# Patient Record
Sex: Female | Born: 1940 | Race: White | Hispanic: No | Smoking: Never smoker
Health system: Southern US, Community
[De-identification: ages and names within clinical notes are randomized; demographics above are authoritative.]

## PROBLEM LIST (undated history)

## (undated) DIAGNOSIS — I2510 Atherosclerotic heart disease of native coronary artery without angina pectoris: Secondary | ICD-10-CM

## (undated) DIAGNOSIS — K219 Gastro-esophageal reflux disease without esophagitis: Secondary | ICD-10-CM

## (undated) DIAGNOSIS — I471 Supraventricular tachycardia, unspecified: Secondary | ICD-10-CM

## (undated) DIAGNOSIS — I5189 Other ill-defined heart diseases: Secondary | ICD-10-CM

## (undated) DIAGNOSIS — H9190 Unspecified hearing loss, unspecified ear: Secondary | ICD-10-CM

## (undated) DIAGNOSIS — K602 Anal fissure, unspecified: Secondary | ICD-10-CM

## (undated) DIAGNOSIS — H35363 Drusen (degenerative) of macula, bilateral: Secondary | ICD-10-CM

## (undated) DIAGNOSIS — H25819 Combined forms of age-related cataract, unspecified eye: Secondary | ICD-10-CM

## (undated) DIAGNOSIS — E785 Hyperlipidemia, unspecified: Secondary | ICD-10-CM

## (undated) DIAGNOSIS — D509 Iron deficiency anemia, unspecified: Secondary | ICD-10-CM

## (undated) DIAGNOSIS — R739 Hyperglycemia, unspecified: Secondary | ICD-10-CM

## (undated) DIAGNOSIS — G250 Essential tremor: Secondary | ICD-10-CM

## (undated) DIAGNOSIS — J189 Pneumonia, unspecified organism: Secondary | ICD-10-CM

## (undated) DIAGNOSIS — M3500 Sicca syndrome, unspecified: Secondary | ICD-10-CM

## (undated) DIAGNOSIS — M8580 Other specified disorders of bone density and structure, unspecified site: Secondary | ICD-10-CM

## (undated) DIAGNOSIS — H524 Presbyopia: Secondary | ICD-10-CM

## (undated) DIAGNOSIS — I700 Atherosclerosis of aorta: Secondary | ICD-10-CM

## (undated) DIAGNOSIS — M810 Age-related osteoporosis without current pathological fracture: Secondary | ICD-10-CM

## (undated) DIAGNOSIS — H52209 Unspecified astigmatism, unspecified eye: Secondary | ICD-10-CM

## (undated) DIAGNOSIS — K649 Unspecified hemorrhoids: Secondary | ICD-10-CM

## (undated) DIAGNOSIS — R0602 Shortness of breath: Secondary | ICD-10-CM

## (undated) DIAGNOSIS — Z87448 Personal history of other diseases of urinary system: Secondary | ICD-10-CM

## (undated) DIAGNOSIS — M5030 Other cervical disc degeneration, unspecified cervical region: Secondary | ICD-10-CM

## (undated) DIAGNOSIS — D3140 Benign neoplasm of unspecified ciliary body: Secondary | ICD-10-CM

## (undated) DIAGNOSIS — K5900 Constipation, unspecified: Secondary | ICD-10-CM

## (undated) DIAGNOSIS — M1990 Unspecified osteoarthritis, unspecified site: Secondary | ICD-10-CM

## (undated) DIAGNOSIS — F419 Anxiety disorder, unspecified: Secondary | ICD-10-CM

## (undated) DIAGNOSIS — R079 Chest pain, unspecified: Secondary | ICD-10-CM

## (undated) DIAGNOSIS — I10 Essential (primary) hypertension: Secondary | ICD-10-CM

## (undated) DIAGNOSIS — L309 Dermatitis, unspecified: Secondary | ICD-10-CM

## (undated) DIAGNOSIS — H5200 Hypermetropia, unspecified eye: Secondary | ICD-10-CM

## (undated) DIAGNOSIS — K635 Polyp of colon: Secondary | ICD-10-CM

## (undated) DIAGNOSIS — T7840XA Allergy, unspecified, initial encounter: Secondary | ICD-10-CM

## (undated) DIAGNOSIS — H43813 Vitreous degeneration, bilateral: Secondary | ICD-10-CM

## (undated) DIAGNOSIS — Z8619 Personal history of other infectious and parasitic diseases: Secondary | ICD-10-CM

## (undated) DIAGNOSIS — I341 Nonrheumatic mitral (valve) prolapse: Secondary | ICD-10-CM

## (undated) DIAGNOSIS — E559 Vitamin D deficiency, unspecified: Secondary | ICD-10-CM

## (undated) HISTORY — DX: Age-related osteoporosis without current pathological fracture: M81.0

## (undated) HISTORY — DX: Unspecified osteoarthritis, unspecified site: M19.90

## (undated) HISTORY — DX: Other specified disorders of bone density and structure, unspecified site: M85.80

## (undated) HISTORY — PX: COLON SURGERY: SHX602

## (undated) HISTORY — DX: Combined forms of age-related cataract, unspecified eye: H25.819

## (undated) HISTORY — DX: Unspecified hemorrhoids: K64.9

## (undated) HISTORY — DX: Unspecified hearing loss, unspecified ear: H91.90

## (undated) HISTORY — DX: Hypermetropia, unspecified eye: H52.00

## (undated) HISTORY — DX: Dermatitis, unspecified: L30.9

## (undated) HISTORY — PX: CYST EXCISION: SHX5701

## (undated) HISTORY — DX: Drusen (degenerative) of macula, bilateral: H35.363

## (undated) HISTORY — DX: Sjogren syndrome, unspecified: M35.00

## (undated) HISTORY — PX: ORIF FINGER / THUMB FRACTURE: SUR932

## (undated) HISTORY — DX: Gastro-esophageal reflux disease without esophagitis: K21.9

## (undated) HISTORY — PX: TONSILLECTOMY: SUR1361

## (undated) HISTORY — DX: Constipation, unspecified: K59.00

## (undated) HISTORY — DX: Polyp of colon: K63.5

## (undated) HISTORY — DX: Vitreous degeneration, bilateral: H43.813

## (undated) HISTORY — PX: INCISION AND DRAINAGE PERIRECTAL ABSCESS: SHX1804

## (undated) HISTORY — PX: HEMORRHOID SURGERY: SHX153

## (undated) HISTORY — DX: Hyperlipidemia, unspecified: E78.5

## (undated) HISTORY — PX: ABDOMINAL HYSTERECTOMY: SHX81

## (undated) HISTORY — DX: Personal history of other diseases of urinary system: Z87.448

## (undated) HISTORY — DX: Vitamin D deficiency, unspecified: E55.9

## (undated) HISTORY — DX: Unspecified astigmatism, unspecified eye: H52.209

## (undated) HISTORY — DX: Essential tremor: G25.0

## (undated) HISTORY — DX: Allergy, unspecified, initial encounter: T78.40XA

## (undated) HISTORY — DX: Nonrheumatic mitral (valve) prolapse: I34.1

## (undated) HISTORY — DX: Benign neoplasm of unspecified ciliary body: D31.40

## (undated) HISTORY — DX: Essential (primary) hypertension: I10

## (undated) HISTORY — DX: Presbyopia: H52.4

## (undated) HISTORY — DX: Hyperglycemia, unspecified: R73.9

## (undated) HISTORY — DX: Anxiety disorder, unspecified: F41.9

## (undated) HISTORY — DX: Personal history of other infectious and parasitic diseases: Z86.19

---

## 2005-06-18 HISTORY — PX: COLONOSCOPY W/ BIOPSIES: SHX1374

## 2010-11-25 DIAGNOSIS — F419 Anxiety disorder, unspecified: Secondary | ICD-10-CM | POA: Insufficient documentation

## 2010-11-25 DIAGNOSIS — I341 Nonrheumatic mitral (valve) prolapse: Secondary | ICD-10-CM | POA: Insufficient documentation

## 2010-11-25 DIAGNOSIS — K219 Gastro-esophageal reflux disease without esophagitis: Secondary | ICD-10-CM | POA: Insufficient documentation

## 2010-11-25 DIAGNOSIS — Z8619 Personal history of other infectious and parasitic diseases: Secondary | ICD-10-CM | POA: Insufficient documentation

## 2010-11-25 DIAGNOSIS — M3500 Sicca syndrome, unspecified: Secondary | ICD-10-CM | POA: Insufficient documentation

## 2010-11-25 DIAGNOSIS — K635 Polyp of colon: Secondary | ICD-10-CM | POA: Insufficient documentation

## 2010-11-25 DIAGNOSIS — G250 Essential tremor: Secondary | ICD-10-CM | POA: Insufficient documentation

## 2010-11-25 DIAGNOSIS — M8580 Other specified disorders of bone density and structure, unspecified site: Secondary | ICD-10-CM | POA: Insufficient documentation

## 2011-02-19 DIAGNOSIS — E559 Vitamin D deficiency, unspecified: Secondary | ICD-10-CM | POA: Insufficient documentation

## 2012-07-26 DIAGNOSIS — H25819 Combined forms of age-related cataract, unspecified eye: Secondary | ICD-10-CM | POA: Insufficient documentation

## 2012-07-26 DIAGNOSIS — H5200 Hypermetropia, unspecified eye: Secondary | ICD-10-CM | POA: Insufficient documentation

## 2012-07-26 DIAGNOSIS — D3140 Benign neoplasm of unspecified ciliary body: Secondary | ICD-10-CM | POA: Insufficient documentation

## 2013-02-16 DIAGNOSIS — I10 Essential (primary) hypertension: Secondary | ICD-10-CM | POA: Insufficient documentation

## 2013-12-07 DIAGNOSIS — K5909 Other constipation: Secondary | ICD-10-CM | POA: Insufficient documentation

## 2013-12-07 DIAGNOSIS — L309 Dermatitis, unspecified: Secondary | ICD-10-CM | POA: Insufficient documentation

## 2016-07-22 DIAGNOSIS — L820 Inflamed seborrheic keratosis: Secondary | ICD-10-CM | POA: Diagnosis not present

## 2016-08-04 DIAGNOSIS — R0789 Other chest pain: Secondary | ICD-10-CM | POA: Diagnosis not present

## 2016-08-04 DIAGNOSIS — I10 Essential (primary) hypertension: Secondary | ICD-10-CM | POA: Diagnosis not present

## 2016-08-04 DIAGNOSIS — E785 Hyperlipidemia, unspecified: Secondary | ICD-10-CM | POA: Diagnosis not present

## 2016-08-04 DIAGNOSIS — M67442 Ganglion, left hand: Secondary | ICD-10-CM | POA: Diagnosis not present

## 2016-09-22 DIAGNOSIS — I10 Essential (primary) hypertension: Secondary | ICD-10-CM | POA: Diagnosis not present

## 2016-09-22 DIAGNOSIS — E559 Vitamin D deficiency, unspecified: Secondary | ICD-10-CM | POA: Diagnosis not present

## 2016-09-22 DIAGNOSIS — R739 Hyperglycemia, unspecified: Secondary | ICD-10-CM | POA: Diagnosis not present

## 2016-09-22 DIAGNOSIS — E785 Hyperlipidemia, unspecified: Secondary | ICD-10-CM | POA: Diagnosis not present

## 2016-09-22 LAB — BASIC METABOLIC PANEL
BUN: 15 mg/dL (ref 4–21)
Creatinine: 0.8 mg/dL (ref 0.5–1.1)
GLUCOSE: 94 mg/dL
Potassium: 4.9 mmol/L (ref 3.4–5.3)
Sodium: 140 mmol/L (ref 137–147)

## 2016-09-22 LAB — VITAMIN D 25 HYDROXY (VIT D DEFICIENCY, FRACTURES): VIT D 25 HYDROXY: 49.5

## 2016-09-22 LAB — LIPID PANEL
CHOLESTEROL: 214 mg/dL — AB (ref 0–200)
HDL: 71 mg/dL — AB (ref 35–70)
LDL CALC: 125 mg/dL
Triglycerides: 90 mg/dL (ref 40–160)

## 2016-09-22 LAB — HEPATIC FUNCTION PANEL
ALK PHOS: 58 U/L (ref 25–125)
ALT: 19 U/L (ref 7–35)
AST: 22 U/L (ref 13–35)
Bilirubin, Total: 0.2 mg/dL

## 2016-09-22 LAB — CBC AND DIFFERENTIAL
HEMATOCRIT: 35 % — AB (ref 36–46)
HEMOGLOBIN: 11.3 g/dL — AB (ref 12.0–16.0)
Neutrophils Absolute: 1 /uL
Platelets: 190 10*3/uL (ref 150–399)
WBC: 3.2 10*3/mL

## 2016-09-23 DIAGNOSIS — R2232 Localized swelling, mass and lump, left upper limb: Secondary | ICD-10-CM | POA: Diagnosis not present

## 2016-09-29 DIAGNOSIS — H35363 Drusen (degenerative) of macula, bilateral: Secondary | ICD-10-CM | POA: Insufficient documentation

## 2016-09-29 DIAGNOSIS — H43813 Vitreous degeneration, bilateral: Secondary | ICD-10-CM | POA: Insufficient documentation

## 2016-10-02 DIAGNOSIS — K5909 Other constipation: Secondary | ICD-10-CM | POA: Diagnosis not present

## 2016-10-02 DIAGNOSIS — Z0000 Encounter for general adult medical examination without abnormal findings: Secondary | ICD-10-CM | POA: Diagnosis not present

## 2016-10-02 DIAGNOSIS — Z23 Encounter for immunization: Secondary | ICD-10-CM | POA: Diagnosis not present

## 2016-10-02 DIAGNOSIS — R739 Hyperglycemia, unspecified: Secondary | ICD-10-CM | POA: Diagnosis not present

## 2016-10-02 DIAGNOSIS — E785 Hyperlipidemia, unspecified: Secondary | ICD-10-CM | POA: Diagnosis not present

## 2016-10-02 DIAGNOSIS — E559 Vitamin D deficiency, unspecified: Secondary | ICD-10-CM | POA: Diagnosis not present

## 2016-10-02 DIAGNOSIS — I10 Essential (primary) hypertension: Secondary | ICD-10-CM | POA: Diagnosis not present

## 2016-10-02 DIAGNOSIS — M8580 Other specified disorders of bone density and structure, unspecified site: Secondary | ICD-10-CM | POA: Diagnosis not present

## 2016-10-07 DIAGNOSIS — M67442 Ganglion, left hand: Secondary | ICD-10-CM | POA: Diagnosis not present

## 2016-10-07 DIAGNOSIS — R2232 Localized swelling, mass and lump, left upper limb: Secondary | ICD-10-CM | POA: Diagnosis not present

## 2016-10-19 DIAGNOSIS — I10 Essential (primary) hypertension: Secondary | ICD-10-CM | POA: Diagnosis not present

## 2016-10-19 DIAGNOSIS — J40 Bronchitis, not specified as acute or chronic: Secondary | ICD-10-CM | POA: Diagnosis not present

## 2016-10-19 DIAGNOSIS — J0100 Acute maxillary sinusitis, unspecified: Secondary | ICD-10-CM | POA: Diagnosis not present

## 2017-01-14 DIAGNOSIS — H43813 Vitreous degeneration, bilateral: Secondary | ICD-10-CM | POA: Diagnosis not present

## 2017-03-17 ENCOUNTER — Encounter: Payer: Self-pay | Admitting: Family Medicine

## 2017-03-17 ENCOUNTER — Ambulatory Visit (INDEPENDENT_AMBULATORY_CARE_PROVIDER_SITE_OTHER): Payer: Medicare Other | Admitting: Family Medicine

## 2017-03-17 VITALS — BP 120/86 | HR 74 | Temp 97.5°F | Ht 64.5 in | Wt 126.0 lb

## 2017-03-17 DIAGNOSIS — Z1239 Encounter for other screening for malignant neoplasm of breast: Secondary | ICD-10-CM

## 2017-03-17 DIAGNOSIS — E2839 Other primary ovarian failure: Secondary | ICD-10-CM

## 2017-03-17 DIAGNOSIS — G2581 Restless legs syndrome: Secondary | ICD-10-CM | POA: Insufficient documentation

## 2017-03-17 DIAGNOSIS — F419 Anxiety disorder, unspecified: Secondary | ICD-10-CM

## 2017-03-17 DIAGNOSIS — Z1231 Encounter for screening mammogram for malignant neoplasm of breast: Secondary | ICD-10-CM

## 2017-03-17 DIAGNOSIS — G4700 Insomnia, unspecified: Secondary | ICD-10-CM

## 2017-03-17 DIAGNOSIS — M8580 Other specified disorders of bone density and structure, unspecified site: Secondary | ICD-10-CM

## 2017-03-17 DIAGNOSIS — E785 Hyperlipidemia, unspecified: Secondary | ICD-10-CM

## 2017-03-17 DIAGNOSIS — I10 Essential (primary) hypertension: Secondary | ICD-10-CM

## 2017-03-17 MED ORDER — ZOSTER VAC RECOMB ADJUVANTED 50 MCG/0.5ML IM SUSR: 0.5000 mL | Freq: Once | INTRAMUSCULAR | 0 refills | Status: AC

## 2017-03-17 MED ORDER — ALPRAZOLAM 0.5 MG PO TABS: 0.5000 mg | ORAL_TABLET | Freq: Every evening | ORAL | 0 refills | Status: DC | PRN

## 2017-03-17 MED ORDER — ATORVASTATIN CALCIUM 10 MG PO TABS: 10.0000 mg | ORAL_TABLET | Freq: Every day | ORAL | 3 refills | Status: DC

## 2017-03-17 MED ORDER — ESCITALOPRAM OXALATE 10 MG PO TABS: 5.0000 mg | ORAL_TABLET | Freq: Every day | ORAL | 1 refills | Status: DC

## 2017-03-17 NOTE — Assessment & Plan Note (Signed)
PRN Xanax 

## 2017-03-17 NOTE — Assessment & Plan Note (Signed)
Stable. No medication needed at this time.

## 2017-03-17 NOTE — Patient Instructions (Signed)
Follow up annually.  Continue your meds.  Take care  Dr. Lacinda Axon

## 2017-03-17 NOTE — Assessment & Plan Note (Signed)
Arranging dexa.

## 2017-03-17 NOTE — Assessment & Plan Note (Signed)
Stable on Lexapro.  Continue. 

## 2017-03-17 NOTE — Assessment & Plan Note (Signed)
Stable. Continue lipitor. 

## 2017-03-17 NOTE — Progress Notes (Signed)
Subjective:  Patient ID: Barbara Garcia, female    DOB: [birth year]  Age: 76 y.o. MRN: [id number]  CC: Establish care  HPI Barbara Garcia is a 76 y.o. female presents to the clinic today to establish care. Issues are below.  HTN  At goal without medication.  Pressures well controlled.  HLD  Has been stable on Lipitor.  Anxiety  Stable on Lexapro.  Osteopenia  Not currently on medication. Was previously treated for osteoporosis and was on Fosamax.  Needs Dexa.  RLS/Insomnia  Patient reports that she's having significant difficulty with restless leg and associated insomnia.  She is taking sleep medication the past without symptoms improvement.  She states that she has had a tremendous improvement with PRN Xanax. She would like to discuss this today.  PMH, Surgical Hx, Family Hx, Social History reviewed and updated as below.  Past Medical History:  Diagnosis Date  . Allergy   . Anxiety   . Arthritis   . Benign essential tremor   . Chronic dermatitis   . Colon polyps   . Combined form of senile cataract   . Constipation   . GERD (gastroesophageal reflux disease)   . Hearing loss   . Hemorrhoids   . History of cystocele   . History of shingles   . Hyperglycemia   . Hyperlipidemia   . Hyperopia with astigmatism and presbyopia   . Hypertension   . Iris nevus   . MVP (mitral valve prolapse)   . Osteopenia   . Osteoporosis   . Posterior vitreous detachment, bilateral   . Retinal drusen, bilateral   . Sjogrens syndrome ([place])   . Vitamin D deficiency    Past Surgical History:  Procedure Laterality Date  . ABDOMINAL HYSTERECTOMY    . COLON SURGERY    . COLONOSCOPY W/ BIOPSIES  06/18/2005  . TONSILLECTOMY     Family History  Problem Relation Age of Onset  . Hypertension Mother   . Cancer Mother   . Stroke Mother   . Heart disease Father   . Hyperlipidemia Father   . Hypertension Father   . Arthritis Sister   . Hypertension Sister   . Kidney  disease Sister   . Stroke Sister    Social History  Substance Use Topics  . Smoking status: Never Smoker  . Smokeless tobacco: Never Used  . Alcohol use No   Review of Systems  HENT: Positive for hearing loss and trouble swallowing.   Gastrointestinal: Positive for constipation.  Neurological: Positive for dizziness.       Restless leg.  All other systems reviewed and are negative.   Objective:   Today's Vitals: BP 120/86   Pulse 74   Temp 97.5 F (36.4 C) (Oral)   Ht 5' 4.5" (1.638 m)   Wt 126 lb (57.2 kg)   SpO2 98%   BMI 21.29 kg/m   Physical Exam  Constitutional: She is oriented to person, place, and time. She appears well-developed and well-nourished. No distress.  HENT:  Head: Normocephalic and atraumatic.  Nose: Nose normal.  Mouth/Throat: Oropharynx is clear and moist. No oropharyngeal exudate.  Eyes: Conjunctivae are normal. No scleral icterus.  Neck: Neck supple.  Cardiovascular: Normal rate and regular rhythm.   No murmur heard. Pulmonary/Chest: Effort normal and breath sounds normal. She has no wheezes. She has no rales.  Abdominal: Soft. She exhibits no distension. There is no tenderness. There is no rebound and no guarding.  Musculoskeletal: Normal range of motion. She exhibits  no edema.  Lymphadenopathy:    She has no cervical adenopathy.  Neurological: She is alert and oriented to person, place, and time.  Skin: Skin is warm and dry. No rash noted.  Psychiatric: She has a normal mood and affect.  Vitals reviewed.  Assessment & Plan:   Problem List Items Addressed This Visit    Osteopenia    Arranging dexa.      Mild anxiety    Stable on Lexapro. Continue.      Relevant Medications   escitalopram (LEXAPRO) 10 MG tablet   ALPRAZolam (XANAX) 0.5 MG tablet   Insomnia    PRN Xanax.      Hypertension goal BP (blood pressure) < 130/80 - Primary    Stable. No medication needed at this time.      Relevant Medications   atorvastatin  (LIPITOR) 10 MG tablet   Hyperlipidemia    Stable. Continue lipitor.       Relevant Medications   atorvastatin (LIPITOR) 10 MG tablet    Other Visit Diagnoses    Breast cancer screening       Relevant Orders   MM Digital Screening   Estrogen deficiency       Relevant Orders   DG BONE DENSITY (DXA)      Meds ordered this encounter  Medications  . DISCONTD: atorvastatin (LIPITOR) 10 MG tablet    Sig: Take by mouth.  . DISCONTD: escitalopram (LEXAPRO) 10 MG tablet    Sig: 5 mg.  . Omega-3 Fatty Acids (FISH OIL OMEGA-3 PO)    Sig: Take by mouth.  . OMEPRAZOLE PO    Sig: Take by mouth as needed.  . CHOLECALCIFEROL PO    Sig: Take by mouth.  . Polyethylene Glycol 3350 (MIRALAX PO)    Sig: Take by mouth.  . escitalopram (LEXAPRO) 10 MG tablet    Sig: Take 0.5 tablets (5 mg total) by mouth daily.    Dispense:  90 tablet    Refill:  1  . atorvastatin (LIPITOR) 10 MG tablet    Sig: Take 1 tablet (10 mg total) by mouth daily.    Dispense:  90 tablet    Refill:  3  . ALPRAZolam (XANAX) 0.5 MG tablet    Sig: Take 1 tablet (0.5 mg total) by mouth at bedtime as needed for anxiety.    Dispense:  30 tablet    Refill:  0  . Zoster Vac Recomb Adjuvanted (SHINGRIX) injection    Sig: Inject 0.5 mLs into the muscle once.    Dispense:  0.5 mL    Refill:  0    Please administer second dose at appropriate interval.    Follow-up: Return in about 1 year (around 03/17/2018).  Parcel[place] Mandry

## 2017-03-17 NOTE — Progress Notes (Signed)
Pre visit review using our clinic review tool, if applicable. No additional management support is needed unless otherwise documented below in the visit note. 

## 2017-04-21 ENCOUNTER — Telehealth: Payer: Self-pay | Admitting: *Deleted

## 2017-04-21 ENCOUNTER — Telehealth: Payer: Self-pay | Admitting: Family Medicine

## 2017-04-21 NOTE — Telephone Encounter (Signed)
I spoke with the patient, she is feeling much better now, I requested due to her symptoms earlier to go get seen today in a urgent care or ED and she declined.  She said the symptoms come and go so she will wait until tomorrow to see Dr. Lacinda Axon.  She was on her way to the grocery store and I again said it would be best to be seen and declined again.  States I will see your office tomorrow.

## 2017-04-21 NOTE — Telephone Encounter (Signed)
[facility] Patient Name: Barbara Garcia DOB: [birth year] Initial Comment Caller states pt has extreme fatigue; dizziness; HR sitting elevates from 72 to 113 bpm, 5-6 days; feels dragging and wants to sleep all the time; Nurse Assessment Nurse: Markus Daft, RN, Sherre Poot Date/Time (Eastern Time): 04/21/2017 12:38:34 PM Confirm and document reason for call. If symptomatic, describe symptoms. ---Caller states pt has had a lot of fatigue, and dizziness when changing positions. BP around 117/64 on average, and then when standing drops to 87/50. HR while resting elevates from 72 to 117 bpm. S/S started in last 5-6 days. Does the patient have any new or worsening symptoms? ---Yes Will a triage be completed? ---Yes Related visit to physician within the last 2 weeks? ---No Does the PT have any chronic conditions? (i.e. diabetes, asthma, etc.) ---Yes List chronic conditions. ---Mitral Valve Prolpase Is this a behavioral health or substance abuse call? ---No Guidelines Guideline Title Affirmed Question Affirmed Notes Low Blood Pressure [2] Fall in systolic BP > 20 mm Hg from normal AND [2] NOT dizzy, lightheaded, or weak Final Disposition User See PCP When Office is Open (within 3 days) Markus Daft, Therapist, sports, [place] Comments 12:46 pm -->Sitting down now. Denies dizziness. BP rechecked now and it was 153/87, pulse 97. Then she stood up and not as dizzy as she has been now. BP 145/75, pulse 99. Appt made with Dr. Lacinda Axon for tomorrow at 10:30 am. Disagree/Comply: Leta Baptist

## 2017-04-21 NOTE — Telephone Encounter (Signed)
Not able to reach patient left message to return call to office.

## 2017-04-21 NOTE — Telephone Encounter (Signed)
Noted. Thanks for following up with her. I previously advised evaluation today.

## 2017-04-21 NOTE — Telephone Encounter (Signed)
Pleasse advise ok [facility] scheduled for tomorrow.

## 2017-04-21 NOTE — Telephone Encounter (Signed)
Patient is having extreme weakness and dizziness when standing. This has been going on for 5-6 days. Pt feels as if her heart rate elevates while sitting estimating 72-113 beats per minute.  Pt contact [phone] *Pt was transferred to Nurse Line

## 2017-04-22 ENCOUNTER — Ambulatory Visit (INDEPENDENT_AMBULATORY_CARE_PROVIDER_SITE_OTHER): Payer: Medicare Other | Admitting: Family Medicine

## 2017-04-22 ENCOUNTER — Ambulatory Visit: Payer: Self-pay | Admitting: Family

## 2017-04-22 VITALS — BP 142/82 | HR 76 | Temp 98.6°F | Resp 12 | Wt 125.2 lb

## 2017-04-22 DIAGNOSIS — R42 Dizziness and giddiness: Secondary | ICD-10-CM | POA: Insufficient documentation

## 2017-04-22 DIAGNOSIS — D649 Anemia, unspecified: Secondary | ICD-10-CM | POA: Diagnosis not present

## 2017-04-22 DIAGNOSIS — E785 Hyperlipidemia, unspecified: Secondary | ICD-10-CM | POA: Diagnosis not present

## 2017-04-22 DIAGNOSIS — I10 Essential (primary) hypertension: Secondary | ICD-10-CM | POA: Diagnosis not present

## 2017-04-22 LAB — COMPREHENSIVE METABOLIC PANEL
ALBUMIN: 4.3 g/dL (ref 3.5–5.2)
ALT: 10 U/L (ref 0–35)
AST: 17 U/L (ref 0–37)
Alkaline Phosphatase: 58 U/L (ref 39–117)
BUN: 19 mg/dL (ref 6–23)
CHLORIDE: 105 meq/L (ref 96–112)
CO2: 31 meq/L (ref 19–32)
Calcium: 9.5 mg/dL (ref 8.4–10.5)
Creatinine, Ser: 0.84 mg/dL (ref 0.40–1.20)
GFR: 69.91 mL/min (ref >60.00)
Glucose, Bld: 99 mg/dL (ref 70–99)
Potassium: 4.6 mEq/L (ref 3.5–5.1)
SODIUM: 139 meq/L (ref 135–145)
Total Bilirubin: 0.4 mg/dL (ref 0.2–1.2)
Total Protein: 7.8 g/dL (ref 6.0–8.3)

## 2017-04-22 LAB — LIPID PANEL
CHOL/HDL RATIO: 2
CHOLESTEROL: 163 mg/dL (ref 0–200)
HDL: 72 mg/dL (ref >39.00)
LDL CALC: 73 mg/dL (ref 0–99)
NonHDL: 90.73
TRIGLYCERIDES: 88 mg/dL (ref 0.0–149.0)
VLDL: 17.6 mg/dL (ref 0.0–40.0)

## 2017-04-22 LAB — CBC
HEMATOCRIT: 36.4 % (ref 36.0–46.0)
Hemoglobin: 12.2 g/dL (ref 12.0–15.0)
MCHC: 33.4 g/dL (ref 30.0–36.0)
MCV: 86.7 fl (ref 78.0–100.0)
Platelets: 195 10*3/uL (ref 150.0–400.0)
RBC: 4.2 Mil/uL (ref 3.87–5.11)
RDW: 14.3 % (ref 11.5–15.5)
WBC: 3.4 10*3/uL — ABNORMAL LOW (ref 4.0–10.5)

## 2017-04-22 MED ORDER — ESCITALOPRAM OXALATE 10 MG PO TABS: ORAL_TABLET | ORAL | 1 refills | Status: DC

## 2017-04-22 NOTE — Progress Notes (Signed)
Subjective:  Patient ID: Barbara Garcia, female    DOB: 12-Mar-1941  Age: 75 y.o. MRN: [id number]  CC: Dizziness/BP issues  HPI:  76 year old female presents with the above complaints.  Patient reports a one-week history of dizziness. She states that it's worse when she bends over or rises from sitting position. She states that she's had some low blood pressure readings at home as well. She is not currently on any antihypertensives. She is unsure the reason of her sudden onset of symptoms. She describes the dizziness as feeling off balance. She states that it does not feel like vertigo that she's had this in the past. No reports of presyncope. Relieved with rest. No other associated symptoms. No other complaints or concerns at this time.  Social Hx   Social History   Social History  . Marital status: Married    Spouse name: N/A  . Number of children: N/A  . Years of education: N/A   Social History Main Topics  . Smoking status: Never Smoker  . Smokeless tobacco: Never Used  . Alcohol use No  . Drug use: No  . Sexual activity: No   Other Topics Concern  . Not on file   Social History Narrative  . No narrative on file    Review of Systems  Constitutional: Negative.   Neurological: Positive for dizziness.   Objective:  BP (!) 142/82 (BP Location: Left Arm, Patient Position: Sitting, Cuff Size: Normal)   Pulse 76   Temp 98.6 F (37 C) (Oral)   Resp 12   Wt 125 lb 4 oz (56.8 kg)   SpO2 98%   BMI 21.17 kg/m   BP/Weight 04/22/2017 0/37/0488  Systolic BP 891 694  Diastolic BP 82 86  Wt. (Lbs) 125.25 126  BMI 21.17 21.29    Physical Exam  Constitutional: She is oriented to person, place, and time. She appears well-developed. No distress.  Eyes: EOM are normal. Pupils are equal, round, and reactive to light.  Cardiovascular: Normal rate and regular rhythm.   Pulmonary/Chest: Effort normal and breath sounds normal.  Neurological: She is alert and oriented to person,  place, and time.  Cranial nerves intact. Muscle strength intact. Cerebellar testing normal.  Psychiatric: She has a normal mood and affect.  Vitals reviewed.   Lab Results  Component Value Date   WBC 3.2 09/22/2016   HGB 11.3 (A) 09/22/2016   HCT 35 (A) 09/22/2016   PLT 190 09/22/2016   CHOL 214 (A) 09/22/2016   TRIG 90 09/22/2016   HDL 71 (A) 09/22/2016   LDLCALC 125 09/22/2016   ALT 19 09/22/2016   AST 22 09/22/2016   NA 140 09/22/2016   K 4.9 09/22/2016   CREATININE 0.8 09/22/2016   BUN 15 09/22/2016    Assessment & Plan:   Problem List Items Addressed This Visit      Other   Hyperlipidemia   Relevant Orders   Lipid panel   Dizziness - Primary    New problem. Patient with orthostasis today. Advised aggressive hydration. Lexapro could be contributing. Advised to taper and subsequent discontinued. Patient is take 5 mg every other day for 1 week and then stop. Patient in agreement.  Labs today.        Other Visit Diagnoses    Anemia, unspecified type       Relevant Orders   CBC   Essential hypertension       Relevant Orders   Comprehensive metabolic panel  Meds ordered this encounter  Medications  . escitalopram (LEXAPRO) 10 MG tablet    Sig: 5 mg every other day x 1 week then stop.    Dispense:  90 tablet    Refill:  1   Follow-up: PRN  [place]

## 2017-04-22 NOTE — Telephone Encounter (Signed)
This patient was advised yesterday to follow up with PCP per [facility], I called and requested her to be seen today, declined and went grocery shopping, plans to come to appt today, thanks

## 2017-04-22 NOTE — Assessment & Plan Note (Signed)
New problem. Patient with orthostasis today. Advised aggressive hydration. Lexapro could be contributing. Advised to taper and subsequent discontinued. Patient is take 5 mg every other day for 1 week and then stop. Patient in agreement.  Labs today.

## 2017-04-22 NOTE — Patient Instructions (Signed)
We will call with your lab results.  Follow up if it persists.  Taper the lexapro.  Take care  Dr. Lacinda Axon

## 2017-04-23 ENCOUNTER — Encounter: Payer: Self-pay | Admitting: *Deleted

## 2017-07-01 DIAGNOSIS — H43813 Vitreous degeneration, bilateral: Secondary | ICD-10-CM | POA: Diagnosis not present

## 2017-07-01 DIAGNOSIS — H25813 Combined forms of age-related cataract, bilateral: Secondary | ICD-10-CM | POA: Diagnosis not present

## 2017-07-01 DIAGNOSIS — H35363 Drusen (degenerative) of macula, bilateral: Secondary | ICD-10-CM | POA: Diagnosis not present

## 2017-07-01 DIAGNOSIS — D3142 Benign neoplasm of left ciliary body: Secondary | ICD-10-CM | POA: Diagnosis not present

## 2017-07-07 DIAGNOSIS — X32XXXA Exposure to sunlight, initial encounter: Secondary | ICD-10-CM | POA: Diagnosis not present

## 2017-07-07 DIAGNOSIS — D2271 Melanocytic nevi of right lower limb, including hip: Secondary | ICD-10-CM | POA: Diagnosis not present

## 2017-07-07 DIAGNOSIS — D2261 Melanocytic nevi of right upper limb, including shoulder: Secondary | ICD-10-CM | POA: Diagnosis not present

## 2017-07-07 DIAGNOSIS — L309 Dermatitis, unspecified: Secondary | ICD-10-CM | POA: Diagnosis not present

## 2017-07-07 DIAGNOSIS — D2272 Melanocytic nevi of left lower limb, including hip: Secondary | ICD-10-CM | POA: Diagnosis not present

## 2017-07-07 DIAGNOSIS — L570 Actinic keratosis: Secondary | ICD-10-CM | POA: Diagnosis not present

## 2017-08-03 DIAGNOSIS — D3102 Benign neoplasm of left conjunctiva: Secondary | ICD-10-CM | POA: Diagnosis not present

## 2017-08-03 DIAGNOSIS — H40033 Anatomical narrow angle, bilateral: Secondary | ICD-10-CM | POA: Diagnosis not present

## 2017-08-03 DIAGNOSIS — D3142 Benign neoplasm of left ciliary body: Secondary | ICD-10-CM | POA: Diagnosis not present

## 2017-08-03 DIAGNOSIS — H21302 Idiopathic cysts of iris, ciliary body or anterior chamber, left eye: Secondary | ICD-10-CM | POA: Diagnosis not present

## 2017-08-03 DIAGNOSIS — H1713 Central corneal opacity, bilateral: Secondary | ICD-10-CM | POA: Diagnosis not present

## 2017-08-03 DIAGNOSIS — D3101 Benign neoplasm of right conjunctiva: Secondary | ICD-10-CM | POA: Diagnosis not present

## 2017-08-03 DIAGNOSIS — H31091 Other chorioretinal scars, right eye: Secondary | ICD-10-CM | POA: Diagnosis not present

## 2017-08-03 DIAGNOSIS — H31092 Other chorioretinal scars, left eye: Secondary | ICD-10-CM | POA: Diagnosis not present

## 2017-09-14 ENCOUNTER — Ambulatory Visit (INDEPENDENT_AMBULATORY_CARE_PROVIDER_SITE_OTHER): Payer: Medicare Other

## 2017-09-14 DIAGNOSIS — Z23 Encounter for immunization: Secondary | ICD-10-CM | POA: Diagnosis not present

## 2017-09-27 ENCOUNTER — Ambulatory Visit (INDEPENDENT_AMBULATORY_CARE_PROVIDER_SITE_OTHER): Payer: Medicare Other

## 2017-09-27 VITALS — HR 77 | Temp 98.5°F | Resp 14 | Ht 64.5 in | Wt 126.1 lb

## 2017-09-27 DIAGNOSIS — Z1239 Encounter for other screening for malignant neoplasm of breast: Secondary | ICD-10-CM

## 2017-09-27 DIAGNOSIS — M810 Age-related osteoporosis without current pathological fracture: Secondary | ICD-10-CM | POA: Diagnosis not present

## 2017-09-27 DIAGNOSIS — Z0000 Encounter for general adult medical examination without abnormal findings: Secondary | ICD-10-CM | POA: Diagnosis not present

## 2017-09-27 NOTE — Patient Instructions (Addendum)
  Barbara Garcia , Thank you for taking time to come for your Medicare Wellness Visit. I appreciate your ongoing commitment to your health goals. Please review the following plan we discussed and let me know if I can assist you in the future.   Follow up with Dr. Caryl Bis as needed.    Bring a copy of your [place] and/or Living Will to be scanned into chart.  Mammogram and Dexa Scan ordered, follow as directed.   Have a great day!  These are the goals we discussed:   Increase water intake Increase physical activity   This is a list of the screening recommended for you and due dates:  Health Maintenance  Topic Date Due  . Tetanus Vaccine  02/18/2021  . Flu Shot  Completed  . DEXA scan (bone density measurement)  Completed  . Pneumonia vaccines  Completed

## 2017-09-27 NOTE — Progress Notes (Signed)
Subjective:   Barbara Garcia is a 76 y.o. female who presents for an Initial Medicare Annual Wellness Visit.  Review of Systems    No ROS.  Medicare Wellness Visit. Additional risk factors are reflected in the social history.  Cardiac Risk Factors include: advanced age (>42men, >61 women);hypertension     Objective:    Today's Vitals   09/27/17 1407  Pulse: 77  Resp: 14  Temp: 98.5 F (36.9 C)  TempSrc: Oral  SpO2: 98%  Weight: 126 lb 1.9 oz (57.2 kg)  Height: 5' 4.5" (1.638 m)   Body mass index is 21.31 kg/m.   Current Medications (verified) Outpatient Encounter Medications as of 09/27/2017  Medication Sig  . ALPRAZolam (XANAX) 0.5 MG tablet Take 1 tablet (0.5 mg total) by mouth at bedtime as needed for anxiety.  Marland Kitchen atorvastatin (LIPITOR) 10 MG tablet Take 1 tablet (10 mg total) by mouth daily.  . CHOLECALCIFEROL PO Take by mouth.  . escitalopram (LEXAPRO) 10 MG tablet 5 mg every other day x 1 week then stop.  . Omega-3 Fatty Acids (FISH OIL OMEGA-3 PO) Take by mouth.  . OMEPRAZOLE PO Take by mouth as needed.  . Polyethylene Glycol 3350 (MIRALAX PO) Take by mouth.   No facility-administered encounter medications on file as of 09/27/2017.     Allergies (verified) Lisinopril   History: Past Medical History:  Diagnosis Date  . Allergy   . Anxiety   . Arthritis   . Benign essential tremor   . Chronic dermatitis   . Colon polyps   . Combined form of senile cataract   . Constipation   . GERD (gastroesophageal reflux disease)   . Hearing loss   . Hemorrhoids   . History of cystocele   . History of shingles   . Hyperglycemia   . Hyperlipidemia   . Hyperopia with astigmatism and presbyopia   . Hypertension   . Iris nevus   . MVP (mitral valve prolapse)   . Osteopenia   . Osteoporosis   . Posterior vitreous detachment, bilateral   . Retinal drusen, bilateral   . Sjogrens syndrome ([place])   . Vitamin D deficiency    Past Surgical History:  Procedure  Laterality Date  . ABDOMINAL HYSTERECTOMY    . COLON SURGERY    . COLONOSCOPY W/ BIOPSIES  06/18/2005  . TONSILLECTOMY     Family History  Problem Relation Age of Onset  . Hypertension Mother   . Cancer Mother   . Stroke Mother   . Heart disease Father   . Hyperlipidemia Father   . Hypertension Father   . Arthritis Sister   . Hypertension Sister   . Kidney disease Sister   . Stroke Sister    Social History   Occupational History  . Not on file  Tobacco Use  . Smoking status: Never Smoker  . Smokeless tobacco: Never Used  Substance and Sexual Activity  . Alcohol use: No  . Drug use: No  . Sexual activity: No    Partners: Male    Tobacco Counseling Counseling given: Not Answered   Activities of Daily Living In your present state of health, do you have any difficulty performing the following activities: 09/27/2017  Hearing? Y  Comment Hearing aids, bilateral  Vision? N  Difficulty concentrating or making decisions? N  Walking or climbing stairs? N  Dressing or bathing? N  Doing errands, shopping? N  Preparing Food and eating ? N  Using the Toilet? N  In  the past six months, have you accidently leaked urine? N  Do you have problems with loss of bowel control? N  Managing your Medications? N  Managing your Finances? N  Housekeeping or managing your Housekeeping? N    Immunizations and Health Maintenance Immunization History  Administered Date(s) Administered  . Hepatitis A, Ped/Adol-2 Dose 05/16/1959  . Influenza, High Dose Seasonal PF 10/02/2016, 09/14/2017  . Pneumococcal Conjugate-13 12/07/2013  . Pneumococcal Polysaccharide-23 07/06/2006  . Tdap 07/06/2006, 02/19/2011   There are no preventive care reminders to display for this patient.  Patient Care Team: Leone Haven, MD as PCP - General (Family Medicine)  Indicate any recent Medical Services you may have received from other than [facility] providers in the past year (date may be approximate).      Assessment:   This is a routine wellness examination for Barbara Garcia. The goal of the wellness visit is to assist the patient how to close the gaps in care and create a preventative care plan for the patient.   The roster of all physicians providing medical care to patient is listed in the Snapshot section of the chart.  Taking calcium VIT D as appropriate/Osteoporosis reviewed.    Safety issues reviewed; Lives with husband. Smoke and carbon monoxide detectors in the home. No firearms in the home.  Wears seatbelts when driving or riding with others. Patient does wear sunscreen or protective clothing when in direct sunlight. No violence in the home.  Patient is alert, normal appearance, oriented to person/place/and time. Correctly identified the president of the [place], recall of 3/3 words, and performing simple calculations. Displays appropriate judgement and can read correct time from watch face.   No new identified risk were noted.  No failures at ADL's or IADL's.    BMI- discussed the importance of a healthy diet, water intake and the benefits of aerobic exercise. Educational material provided.   24 hour diet recall: Low carb balanced diet Daily fluid intake: 0 caffeine,  2 cups of water, 3 cup of decaf tea  Eye- Visual acuity not assessed per patient preference since they have regular follow up with the ophthalmologist.  Wears corrective lenses.  Sleep patterns- Sleeps 8-9 hours at night.  Wakes feeling rested.   Bone Density and Mammogram ordered per patient request;  follow as directed.  Educational material provided.  Patient Concerns: None at this time. Follow up with PCP as needed.  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 No No    Cognitive Function: MMSE - Mini Mental State Exam 09/27/2017  Orientation to time 5  Orientation to Place 5  Registration 3  Attention/ Calculation 5  Recall 3  Language- name 2 objects 2  Language- repeat 1  Language- follow 3 step command 3  Language- read & follow direction 1  Write a sentence 1  Copy design 1  Total score 30  Screening Tests Health Maintenance  Topic Date Due  . TETANUS/TDAP  02/18/2021  . INFLUENZA VACCINE  Completed  . DEXA SCAN  Completed  . PNA vac Low Risk Adult  Completed      Plan:    End of life planning; Advance aging; Advanced directives discussed. Copy of current HCPOA/Living Will requested.    I have personally reviewed and noted the following in the patient's chart:   . Medical and social history . Use of alcohol, tobacco or illicit drugs  . Current medications and supplements . Functional ability and status . Nutritional status . Physical activity . Advanced directives . List of other physicians . Hospitalizations, surgeries, and ER visits in previous 12 months . Vitals . Screenings to include cognitive, depression, and falls . Referrals and appointments  In addition, I have reviewed and discussed with patient certain preventive protocols, quality metrics, and best practice recommendations. A written personalized care plan for preventive services as well as general preventive health recommendations were provided to patient.     Varney Biles, LPN   45/06/997

## 2018-01-04 ENCOUNTER — Ambulatory Visit
Admission: RE | Admit: 2018-01-04 | Discharge: 2018-01-04 | Disposition: A | Discharge status: Home / Self Care | Payer: Medicare Other | Source: Ambulatory Visit | Attending: Family Medicine | Admitting: Family Medicine

## 2018-01-04 DIAGNOSIS — Z780 Asymptomatic menopausal state: Secondary | ICD-10-CM | POA: Diagnosis not present

## 2018-01-04 DIAGNOSIS — M810 Age-related osteoporosis without current pathological fracture: Secondary | ICD-10-CM | POA: Diagnosis not present

## 2018-01-04 DIAGNOSIS — Z1231 Encounter for screening mammogram for malignant neoplasm of breast: Secondary | ICD-10-CM | POA: Diagnosis not present

## 2018-01-04 DIAGNOSIS — Z1239 Encounter for other screening for malignant neoplasm of breast: Secondary | ICD-10-CM

## 2018-01-04 DIAGNOSIS — M85851 Other specified disorders of bone density and structure, right thigh: Secondary | ICD-10-CM | POA: Insufficient documentation

## 2018-01-04 DIAGNOSIS — M8589 Other specified disorders of bone density and structure, multiple sites: Secondary | ICD-10-CM | POA: Diagnosis not present

## 2018-01-11 ENCOUNTER — Telehealth: Payer: Self-pay

## 2018-01-11 NOTE — Telephone Encounter (Signed)
[facility] is requesting records from [facility] and it will be read as soon as they have these

## 2018-01-11 NOTE — Telephone Encounter (Signed)
-----   Message from Leone Haven, MD sent at 01/10/2018  8:55 PM EST ----- Regarding: Mammogram Can you check with [facility] on this patient's mammogram?  It appears to have been done though not read yet.  Thanks.  Randall Hiss. ----- Message ----- From: SYSTEM Sent: 01/09/2018  12:05 AM To: Leone Haven, MD

## 2018-01-11 NOTE — Telephone Encounter (Signed)
Noted. Thanks.

## 2018-01-24 ENCOUNTER — Other Ambulatory Visit: Payer: Self-pay | Admitting: *Deleted

## 2018-01-24 ENCOUNTER — Inpatient Hospital Stay
Admission: RE | Admit: 2018-01-24 | Discharge: 2018-01-24 | Disposition: A | Discharge status: Home / Self Care | Payer: Self-pay | Source: Ambulatory Visit | Attending: *Deleted | Admitting: *Deleted

## 2018-01-24 DIAGNOSIS — Z9289 Personal history of other medical treatment: Secondary | ICD-10-CM

## 2018-01-25 ENCOUNTER — Encounter: Payer: Self-pay | Admitting: Family Medicine

## 2018-02-22 ENCOUNTER — Other Ambulatory Visit: Payer: Self-pay

## 2018-02-22 ENCOUNTER — Encounter: Payer: Self-pay | Admitting: Family Medicine

## 2018-02-22 ENCOUNTER — Ambulatory Visit (INDEPENDENT_AMBULATORY_CARE_PROVIDER_SITE_OTHER): Payer: Medicare Other | Admitting: Family Medicine

## 2018-02-22 VITALS — BP 114/70 | HR 91 | Temp 98.2°F | Ht 65.0 in | Wt 129.0 lb

## 2018-02-22 DIAGNOSIS — L210 Seborrhea capitis: Secondary | ICD-10-CM | POA: Diagnosis not present

## 2018-02-22 DIAGNOSIS — M8580 Other specified disorders of bone density and structure, unspecified site: Secondary | ICD-10-CM | POA: Diagnosis not present

## 2018-02-22 DIAGNOSIS — F419 Anxiety disorder, unspecified: Secondary | ICD-10-CM

## 2018-02-22 DIAGNOSIS — F458 Other somatoform disorders: Secondary | ICD-10-CM | POA: Diagnosis not present

## 2018-02-22 DIAGNOSIS — R0989 Other specified symptoms and signs involving the circulatory and respiratory systems: Secondary | ICD-10-CM

## 2018-02-22 MED ORDER — ALPRAZOLAM 0.5 MG PO TABS: 0.5000 mg | ORAL_TABLET | Freq: Every evening | ORAL | 0 refills | Status: DC | PRN

## 2018-02-22 NOTE — Assessment & Plan Note (Signed)
Mild scalp irritation and dandruff noted.  Discussed trying Selsun Blue or head and shoulders shampoo.  If not improving could consider dermatology evaluation.

## 2018-02-22 NOTE — Assessment & Plan Note (Signed)
DEXA scan with osteopenia.  Hip fracture risk 3%.  This is right at the cutoff for considering treatment.  I discussed the benefits of treatment in reducing risk for fracture.  We discussed briefly the risks of medication with likely treatment being Fosamax.  She was given information on this medication.  She notes no ulcer history.  She will consider whether or not to start on this medication and let us know.

## 2018-02-22 NOTE — Patient Instructions (Signed)
Nice to see you. You should be taking it national units of vitamin D daily.  You should be taking in 1200 mg of calcium daily through dietary sources or supplementation. If you decide you would like to go forward with Fosamax please let us know. Please monitor yourself when you take the Xanax.  If it makes you excessively drowsy please let us know.  Please do not drive and take this.  Please do not drinking alcohol when taking this. Please take the omeprazole daily for the next 2-4 weeks.  We will get you to see ENT as well though if your symptoms go away with treatment of reflux please let us know.

## 2018-02-22 NOTE — Assessment & Plan Note (Signed)
Stable on Lexapro.  Rare Xanax use.  Discussed risk of drowsiness with this medication.  She will not drive while taking this.  Discussed that she should not drink alcohol and take this.  Refill given.

## 2018-02-22 NOTE — Assessment & Plan Note (Signed)
Possibly related to reflux.  Discussed taking omeprazole daily for 2-4 weeks.  Referral placed to ENT.  If it improves with omeprazole treatment before seeing ENT she will contact us.

## 2018-02-22 NOTE — Progress Notes (Signed)
Tommi Rumps, MD Phone: [phone]  Barbara Garcia is a 77 y.o. female who presents today for follow-up.  Osteopenia: She has not been as active recently.  Not taking vitamin D.  She is not taking calcium supplementation.  Frax risk with 3% chance of hip fracture.  Did not meet criteria for total fracture risk.  No prior medications for this.  Anxiety.  Continues on Lexapro.  Rarely takes Xanax only if she cannot sleep at night from anxiety.  She still has the same prescription from prior.  No drowsiness.  No depression.  She does not drink alcohol when taking the Xanax.  Typically gets 8-9 hours of sleep.  GERD: Occasionally takes omeprazole.  Feels frequent need to clear her throat and feels like there is a lump in it.  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  No abdominal pain.  No blood in her stool.  Notes slight scalp itching posterior to her bilateral ears and in her posterior scalp.  She has tried different kinds of shampoo.  Social History   Tobacco Use  Smoking Status Never Smoker  Smokeless Tobacco Never Used     ROS see history of present illness  Objective  Physical Exam Vitals:   02/22/18 1332  BP: 114/70  Pulse: 91  Temp: 98.2 F (36.8 C)  SpO2: 98%    BP Readings from Last 3 Encounters:  02/22/18 114/70  04/22/17 (!) 142/82  03/17/17 120/86   Wt Readings from Last 3 Encounters:  02/22/18 129 lb (58.5 kg)  09/27/17 126 lb 1.9 oz (57.2 kg)  04/22/17 125 lb 4 oz (56.8 kg)    Physical Exam  Constitutional: No distress.  HENT:  Mouth/Throat: Oropharynx is clear and moist. No oropharyngeal exudate.  Eyes: Pupils are equal, round, and reactive to light. Conjunctivae are normal.  Neck: Neck supple.  Cardiovascular: Normal rate, regular rhythm and normal heart sounds.  Pulmonary/Chest: Effort normal and breath sounds normal.  Abdominal: Soft. Bowel sounds are normal. She exhibits no distension. There is  no tenderness. There is no rebound and no guarding.  Musculoskeletal: She exhibits no edema.  Lymphadenopathy:    She has no cervical adenopathy.  Neurological: She is alert. Gait normal.  Skin: Skin is warm and dry. She is not diaphoretic.  Scalp minimally dry with mild dandruff, no other significant scalp abnormalities   Assessment/Plan: Please see individual problem list.  Osteopenia DEXA scan with osteopenia.  Hip fracture risk 3%.  This is right at the cutoff for considering treatment.  I discussed the benefits of treatment in reducing risk for fracture.  We discussed briefly the risks of medication with likely treatment being Fosamax.  She was given information on this medication.  She notes no ulcer history.  She will consider whether or not to start on this medication and let us know.  Globus sensation Possibly related to reflux.  Discussed taking omeprazole daily for 2-4 weeks.  Referral placed to ENT.  If it improves with omeprazole treatment before seeing ENT she will contact us.  Mild anxiety Stable on Lexapro.  Rare Xanax use.  Discussed risk of drowsiness with this medication.  She will not drive while taking this.  Discussed that she should not drink alcohol and take this.  Refill given.  Dandruff Mild scalp irritation and dandruff noted.  Discussed trying Selsun Blue or head and shoulders shampoo.  If not improving could consider dermatology evaluation.   Health  Maintenance: She will get the Shingrix vaccine through her pharmacy.  Orders Placed This Encounter  Procedures  . Ambulatory referral to ENT    Referral Priority:   Routine    Referral Type:   Consultation    Referral Reason:   Specialty Services Required    Requested Specialty:   Otolaryngology    Number of Visits Requested:   1    Meds ordered this encounter  Medications  . ALPRAZolam (XANAX) 0.5 MG tablet    Sig: Take 1 tablet (0.5 mg total) by mouth at bedtime as needed for anxiety.    Dispense:  30  tablet    Refill:  0     Tommi Rumps, MD [place]

## 2018-05-23 DIAGNOSIS — K219 Gastro-esophageal reflux disease without esophagitis: Secondary | ICD-10-CM | POA: Diagnosis not present

## 2018-07-07 DIAGNOSIS — L821 Other seborrheic keratosis: Secondary | ICD-10-CM | POA: Diagnosis not present

## 2018-07-07 DIAGNOSIS — L300 Nummular dermatitis: Secondary | ICD-10-CM | POA: Diagnosis not present

## 2018-07-28 ENCOUNTER — Ambulatory Visit (INDEPENDENT_AMBULATORY_CARE_PROVIDER_SITE_OTHER): Payer: Medicare Other | Admitting: Cardiovascular Disease

## 2018-07-28 ENCOUNTER — Encounter: Payer: Self-pay | Admitting: Cardiovascular Disease

## 2018-07-28 VITALS — BP 152/72 | HR 79 | Ht 65.0 in | Wt 131.5 lb

## 2018-07-28 DIAGNOSIS — I341 Nonrheumatic mitral (valve) prolapse: Secondary | ICD-10-CM

## 2018-07-28 DIAGNOSIS — I2510 Atherosclerotic heart disease of native coronary artery without angina pectoris: Secondary | ICD-10-CM | POA: Diagnosis not present

## 2018-07-28 DIAGNOSIS — E782 Mixed hyperlipidemia: Secondary | ICD-10-CM

## 2018-07-28 DIAGNOSIS — I10 Essential (primary) hypertension: Secondary | ICD-10-CM | POA: Diagnosis not present

## 2018-07-28 MED ORDER — PROPRANOLOL HCL 10 MG PO TABS: 10.0000 mg | ORAL_TABLET | Freq: Three times a day (TID) | ORAL | 1 refills | Status: DC | PRN

## 2018-07-28 NOTE — Progress Notes (Signed)
Cardiology Office Note  Date:  07/28/2018   ID:  Barbara Garcia, DOB Mar 04, 1941, MRN [id number]  PCP:  Leone Haven, MD   Chief Complaint  Patient presents with  . other    Est. Care Pt saw Cardiologist in [place], [place] 3 + yrs ago. No further cardiac testing or procedures CE no complaints today. Meds reviewed verbally with pt.    HPI:  [name] is a 77 year old woman with past medical history of GERD anxiety Mitral valve prolapse Previous chest pain Coronary calcification on CT scan Who presents to establish care for her coronary artery disease  No diabetes No smoking  Significant stress at home with her husband who has medical issues  Previous testing reviewed with her in detail Prior stress test, carotid ultrasound, abdominal aortic ultrasound , CT coronary calcium score No significant carotid disease as detailed below, normal stress test, aorta was normal Very low calcium score 39 She has been maintained on low-dose Lipitor When she takes the medication on a regular basis total cholesterol usually relatively well controlled Most recently total cholesterol 160 It has been as low as 140 Baseline cholesterol without any medication is typically 230s  Testing detailed below Extensive review of previous records Prior stress test September 2016 NSR at rest.  7:35 Bruce; 117% MPHR.  No exertional chest pain. Normal BP response.  1-1.35mm upsloping ST depression laterally at peak, transient/brief; normal in early recovery.  Symptomatically negative, electrically equivocal ECG treadmill test.  EKG personally reviewed by myself on todays visit Shows normal sinus rhythm rate 79 bpm no significant ST or T wave changes   Carotid ultrasound Normal July 2015  CT coronary calcium score 39 in the LAD February 2015  ultrasound aorta normal study abdominal 1/ 2015  Stress test 2011 Carotid ultrasound 2010, no stenosis   PMH:   has a past medical  history of Allergy, Anxiety, Arthritis, Benign essential tremor, Chronic dermatitis, Colon polyps, Combined form of senile cataract, Constipation, GERD (gastroesophageal reflux disease), Hearing loss, Hemorrhoids, History of cystocele, History of shingles, Hyperglycemia, Hyperlipidemia, Hyperopia with astigmatism and presbyopia, Hypertension, Iris nevus, MVP (mitral valve prolapse), Osteopenia, Osteoporosis, Posterior vitreous detachment, bilateral, Retinal drusen, bilateral, Sjogrens syndrome ([place]), and Vitamin D deficiency.  PSH:    Past Surgical History:  Procedure Laterality Date  . ABDOMINAL HYSTERECTOMY    . COLON SURGERY    . COLONOSCOPY W/ BIOPSIES  06/18/2005  . TONSILLECTOMY      Current Outpatient Medications  Medication Sig Dispense Refill  . ALPRAZolam (XANAX) 0.5 MG tablet Take 1 tablet (0.5 mg total) by mouth at bedtime as needed for anxiety. 30 tablet 0  . atorvastatin (LIPITOR) 10 MG tablet Take 1 tablet (10 mg total) by mouth daily. 90 tablet 3  . CHOLECALCIFEROL PO Take by mouth.    . escitalopram (LEXAPRO) 10 MG tablet 5 mg every other day x 1 week then stop. 90 tablet 1  . Omega-3 Fatty Acids (FISH OIL OMEGA-3 PO) Take by mouth.    . pantoprazole (PROTONIX) 40 MG tablet Take 40 mg by mouth daily.    . Polyethylene Glycol 3350 (MIRALAX PO) Take by mouth.    . propranolol (INDERAL) 10 MG tablet Take 1 tablet (10 mg total) by mouth 3 (three) times daily as needed. 90 tablet 1   No current facility-administered medications for this visit.      Allergies:   Lisinopril   Social History:  The patient  reports that she has never smoked. She  has never used smokeless tobacco. She reports that she does not drink alcohol or use drugs.   Family History:   family history includes Arthritis in her sister; Cancer in her mother; Heart Problems in her brother, brother, brother, brother, sister, and sister; Heart attack in her father; Heart disease in her father, mother, and sister;  Heart failure in her mother; Hyperlipidemia in her father; Hypertension in her father, mother, sister, and sister; Kidney disease in her sister; Stroke in her mother and sister.    Review of Systems: Review of Systems  Constitutional: Negative.   Respiratory: Negative.   Cardiovascular: Negative.   Gastrointestinal: Negative.   Musculoskeletal: Negative.   Neurological: Negative.   Psychiatric/Behavioral: Negative.   All other systems reviewed and are negative.    PHYSICAL EXAM: VS:  BP (!) 152/72 (BP Location: Right Arm, Patient Position: Sitting, Cuff Size: Normal)   Pulse 79   Ht 5\' 5"  (1.651 m)   Wt 131 lb 8 oz (59.6 kg)   BMI 21.88 kg/m  , BMI Body mass index is 21.88 kg/m. GEN: Well nourished, well developed, in no acute distress  HEENT: normal  Neck: no JVD, carotid bruits, or masses Cardiac: RRR; no murmurs, rubs, or gallops,no edema  Respiratory:  clear to auscultation bilaterally, normal work of breathing GI: soft, nontender, nondistended, + BS MS: no deformity or atrophy  Skin: warm and dry, no rash Neuro:  Strength and sensation are intact Psych: euthymic mood, full affect  Recent Labs: No results found for requested labs within last 8760 hours.    Lipid Panel Lab Results  Component Value Date   CHOL 163 04/22/2017   HDL 72.00 04/22/2017   LDLCALC 73 04/22/2017   TRIG 88.0 04/22/2017      Wt Readings from Last 3 Encounters:  07/28/18 131 lb 8 oz (59.6 kg)  02/22/18 129 lb (58.5 kg)  09/27/17 126 lb 1.9 oz (57.2 kg)       ASSESSMENT AND PLAN:  Coronary artery calcification seen on CAT scan - Plan: EKG 12-Lead Minimal coronary calcification noted 4 years ago No indication for repeat scanning Denies any symptoms concerning for angina Recommended she stay on her statin No further testing needed  Mixed hyperlipidemia Continue Lipitor Periodically missing doses It would appear when she takes this faithfully cholesterol down to 140s as  demonstrated in the past several years  Hypertension goal BP (blood pressure) < 130/80 - Plan: EKG 12-Lead Numbers well controlled at home Higher on today's visit likely from anxiety She will continue to monitor.  Typically she reports it is 824 systolic at home  MVP (mitral valve prolapse) No significant murmur appreciated on exam No further testing needed  Disposition:   F/U as needed   Total encounter time more than 45 minutes  Greater than 50% was spent in counseling and coordination of care with the patient Home   Orders Placed This Encounter  Procedures  . EKG 12-Lead     Signed, Esmond Plants, M.D., Ph.D. 07/28/2018  [place], [place]

## 2018-07-28 NOTE — Patient Instructions (Addendum)
Medication Instructions:   Propranolol as needed for tachycardia  Labwork:  No new labs needed  Testing/Procedures:  No further testing at this time   Follow-Up: It was a pleasure seeing you in the office today. Please call us if you have new issues that need to be addressed before your next appt.  [phone]  Your physician wants you to follow-up in:  As needed  If you need a refill on your cardiac medications before your next appointment, please call your pharmacy.  For educational health videos Log in to : [email or web address] Or : [email or web address], password : [email or web address]

## 2018-08-19 ENCOUNTER — Other Ambulatory Visit: Payer: Self-pay | Admitting: Cardiovascular Disease

## 2018-08-24 ENCOUNTER — Encounter: Payer: Self-pay | Admitting: Family Medicine

## 2018-08-24 ENCOUNTER — Ambulatory Visit (INDEPENDENT_AMBULATORY_CARE_PROVIDER_SITE_OTHER): Payer: Medicare Other | Admitting: Family Medicine

## 2018-08-24 VITALS — BP 98/62 | HR 78 | Temp 98.3°F | Ht 65.0 in | Wt 130.2 lb

## 2018-08-24 DIAGNOSIS — F419 Anxiety disorder, unspecified: Secondary | ICD-10-CM | POA: Diagnosis not present

## 2018-08-24 DIAGNOSIS — G250 Essential tremor: Secondary | ICD-10-CM

## 2018-08-24 DIAGNOSIS — R0989 Other specified symptoms and signs involving the circulatory and respiratory systems: Secondary | ICD-10-CM

## 2018-08-24 DIAGNOSIS — D72819 Decreased white blood cell count, unspecified: Secondary | ICD-10-CM

## 2018-08-24 DIAGNOSIS — I2510 Atherosclerotic heart disease of native coronary artery without angina pectoris: Secondary | ICD-10-CM | POA: Diagnosis not present

## 2018-08-24 DIAGNOSIS — Z23 Encounter for immunization: Secondary | ICD-10-CM

## 2018-08-24 DIAGNOSIS — E782 Mixed hyperlipidemia: Secondary | ICD-10-CM

## 2018-08-24 DIAGNOSIS — K602 Anal fissure, unspecified: Secondary | ICD-10-CM

## 2018-08-24 LAB — CBC
HEMATOCRIT: 36.1 % (ref 36.0–46.0)
HEMOGLOBIN: 12.3 g/dL (ref 12.0–15.0)
MCHC: 34 g/dL (ref 30.0–36.0)
MCV: 87.2 fl (ref 78.0–100.0)
Platelets: 195 10*3/uL (ref 150.0–400.0)
RBC: 4.14 Mil/uL (ref 3.87–5.11)
RDW: 13.5 % (ref 11.5–15.5)
WBC: 4 10*3/uL (ref 4.0–10.5)

## 2018-08-24 LAB — COMPREHENSIVE METABOLIC PANEL
ALBUMIN: 4.2 g/dL (ref 3.5–5.2)
ALK PHOS: 62 U/L (ref 39–117)
ALT: 9 U/L (ref 0–35)
AST: 15 U/L (ref 0–37)
BUN: 19 mg/dL (ref 6–23)
CALCIUM: 9.4 mg/dL (ref 8.4–10.5)
CO2: 30 mEq/L (ref 19–32)
Chloride: 102 mEq/L (ref 96–112)
Creatinine, Ser: 1.01 mg/dL (ref 0.40–1.20)
GFR: 56.32 mL/min — AB (ref >60.00)
GLUCOSE: 96 mg/dL (ref 70–99)
POTASSIUM: 4.6 meq/L (ref 3.5–5.1)
Sodium: 138 mEq/L (ref 135–145)
TOTAL PROTEIN: 7.6 g/dL (ref 6.0–8.3)
Total Bilirubin: 0.5 mg/dL (ref 0.2–1.2)

## 2018-08-24 LAB — LIPID PANEL
CHOLESTEROL: 187 mg/dL (ref 0–200)
HDL: 66 mg/dL (ref >39.00)
LDL Cholesterol: 92 mg/dL (ref 0–99)
NonHDL: 120.79
TRIGLYCERIDES: 146 mg/dL (ref 0.0–149.0)
Total CHOL/HDL Ratio: 3
VLDL: 29.2 mg/dL (ref 0.0–40.0)

## 2018-08-24 MED ORDER — ALPRAZOLAM 0.5 MG PO TABS: 0.5000 mg | ORAL_TABLET | Freq: Every evening | ORAL | 0 refills | Status: DC | PRN

## 2018-08-24 MED ORDER — ESCITALOPRAM OXALATE 5 MG PO TABS: 5.0000 mg | ORAL_TABLET | Freq: Every day | ORAL | 2 refills | Status: DC

## 2018-08-24 NOTE — Progress Notes (Signed)
Tommi Rumps, MD Phone: [phone]  Barbara Garcia is a 77 y.o. female who presents today for f/u.  CC: GERD, anxiety, essential tremor, rectal fissure, HLD  GERD: Patient was having globus sensation and saw ENT.  They did an exam and did not find any abnormalities per patient report.  She reports they felt her symptoms were related to reflux.  They placed her on Protonix which she does take most days.  It does help when she takes it.  No significant reflux symptoms.  No more globus sensation.  Hyperlipidemia: Taking Lipitor.  No chest pain, right upper quadrant pain, or myalgias.  Anxiety: Most days this is good.  Sometimes her anxiety gets a little high when her husband gets worked up.  She is on Lexapro.  Xanax does not make her drowsy and she does not take it very frequently.  No depression.  Essential tremor: Patient rarely takes the Inderal typically only when she has a performance.  It was beneficial.  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  Some bleeding with the fissure when she has discomfort.  Notes it feels like she is passing a briar.  Social History   Tobacco Use  Smoking Status Never Smoker  Smokeless Tobacco Never Used     ROS see history of present illness  Objective  Physical Exam Vitals:   08/24/18 1023  BP: 98/62  Pulse: 78  Temp: 98.3 F (36.8 C)  SpO2: 97%    BP Readings from Last 3 Encounters:  08/24/18 98/62  07/28/18 (!) 152/72  02/22/18 114/70   Wt Readings from Last 3 Encounters:  08/24/18 130 lb 3.2 oz (59.1 kg)  07/28/18 131 lb 8 oz (59.6 kg)  02/22/18 129 lb (58.5 kg)    Physical Exam  Constitutional: No distress.  Cardiovascular: Normal rate, regular rhythm and normal heart sounds.  Pulmonary/Chest: Effort normal and breath sounds normal.  Genitourinary:  Genitourinary Comments: Chaperone used, right-sided rectal fissure noted at the  3 o'clock position  Musculoskeletal: She exhibits no edema.  Neurological: She is alert.  Skin: Skin is warm and dry. She is not diaphoretic.     Assessment/Plan: Please see individual problem list.  Globus sensation Likely related to reflux given improvement with Protonix.  Encouraged her to take Protonix daily for the next month and if she would like to come off of it she could go to every other day for a month.  If her symptoms recur when coming off of this she should continue with daily Protonix.  Benign essential tremor Inderal as needed  Mild anxiety Continue Lexapro and Xanax.  Xanax refilled.  Controlled substance database reviewed.  Hyperlipidemia Continue Lipitor.  Check lipid panel.  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  We will recheck today.  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  This has been an intermittent ongoing issue.  Will refer to general surgery for evaluation.   Orders Placed This Encounter  Procedures  . Flu vaccine HIGH DOSE PF (Fluzone High dose)  . Comp Met (CMET)  . Lipid panel  . CBC  . Ambulatory referral to General Surgery    Referral Priority:   Routine    Referral Type:   Surgical    Referral Reason:   Specialty Services Required    Requested Specialty:   General Surgery    Number of Visits Requested:   1  Meds ordered this encounter  Medications  . ALPRAZolam (XANAX) 0.5 MG tablet    Sig: Take 1 tablet (0.5 mg total) by mouth at bedtime as needed for anxiety.    Dispense:  30 tablet    Refill:  0  . escitalopram (LEXAPRO) 5 MG tablet    Sig: Take 1 tablet (5 mg total) by mouth daily.    Dispense:  90 tablet    Refill:  2     Tommi Rumps, MD [place]

## 2018-08-24 NOTE — Assessment & Plan Note (Signed)
Inderal as needed

## 2018-08-24 NOTE — Assessment & Plan Note (Signed)
-

## 2018-08-24 NOTE — Assessment & Plan Note (Signed)
Likely related to reflux given improvement with Protonix.  Encouraged her to take Protonix daily for the next month and if she would like to come off of it she could go to every other day for a month.  If her symptoms recur when coming off of this she should continue with daily Protonix.

## 2018-08-24 NOTE — Assessment & Plan Note (Addendum)
Continue Lexapro and Xanax.  Xanax refilled.  Controlled substance database reviewed.

## 2018-08-24 NOTE — Assessment & Plan Note (Addendum)
History and exam consistent with rectal fissure.  This has been an intermittent ongoing issue.  Will refer to general surgery for evaluation.

## 2018-08-24 NOTE — Patient Instructions (Signed)
Nice to see you. We will get you to see a general surgeon for your rectal fissure. We will check lab work today and contact you with the results.

## 2018-08-24 NOTE — Assessment & Plan Note (Signed)
Noted on prior lab work.  We will recheck today.

## 2018-08-25 ENCOUNTER — Other Ambulatory Visit: Payer: Self-pay | Admitting: Family Medicine

## 2018-08-25 ENCOUNTER — Other Ambulatory Visit: Payer: Self-pay | Admitting: Cardiovascular Disease

## 2018-08-25 DIAGNOSIS — N179 Acute kidney failure, unspecified: Secondary | ICD-10-CM

## 2018-08-25 MED ORDER — PROPRANOLOL HCL 10 MG PO TABS: 10.0000 mg | ORAL_TABLET | Freq: Three times a day (TID) | ORAL | 2 refills | Status: DC | PRN

## 2018-08-25 NOTE — Addendum Note (Signed)
Addended by: Alba Destine on: 08/25/2018 11:41 AM   Modules accepted: Orders

## 2018-08-25 NOTE — Addendum Note (Signed)
Addended by: Alba Destine on: 08/25/2018 11:42 AM   Modules accepted: Orders

## 2018-09-05 ENCOUNTER — Ambulatory Visit (INDEPENDENT_AMBULATORY_CARE_PROVIDER_SITE_OTHER): Payer: Medicare Other | Admitting: Surgery

## 2018-09-05 ENCOUNTER — Encounter: Payer: Self-pay | Admitting: Surgery

## 2018-09-05 VITALS — BP 152/85 | HR 77 | Temp 97.7°F | Ht 65.0 in | Wt 130.8 lb

## 2018-09-05 DIAGNOSIS — I2510 Atherosclerotic heart disease of native coronary artery without angina pectoris: Secondary | ICD-10-CM | POA: Diagnosis not present

## 2018-09-05 DIAGNOSIS — K602 Anal fissure, unspecified: Secondary | ICD-10-CM

## 2018-09-05 NOTE — Patient Instructions (Addendum)
Please pick up your Medication at the pharmacy listed below.  [facility] [address] [place], [place] [address] [phone]  Please try Citrucel or benifiber daily. Be sure to increase water daily as well.   Please do not use preparation H.    Please see your follow up appointment listed below.

## 2018-09-05 NOTE — Progress Notes (Signed)
Patient ID: Barbara Garcia, female   DOB: 11/21/1941, 77 y.o.   MRN: [id number]  HPI Barbara Garcia is a 77 y.o. female seen in consultation at the request of Dr. Caryl Bis.  He reports having some intermittent hematochezia and anorectal pain.  She reports  the pain as she is passing razor blades.  No fevers no chills.  Pain gets worse  if she gets constipated and only happens while she is having a bowel movement. Pain is moderate in intensity. Multiple vaginal deliveries and apparently a history of hemorrhoidectomy x2 as well as a fistula that healed.  She reports a history of a fissure in the past that responded to medical therapy.  She does not remember exactly what medication she had.  She has been using Preparation H without any major changes. NO History of ulcerative colitis or Crohn's disease.  No Fevers or chills.    HPI  Past Medical History:  Diagnosis Date  . Allergy   . Anxiety   . Arthritis   . Benign essential tremor   . Chronic dermatitis   . Colon polyps   . Combined form of senile cataract   . Constipation   . GERD (gastroesophageal reflux disease)   . Hearing loss   . Hemorrhoids   . History of cystocele   . History of shingles   . Hyperglycemia   . Hyperlipidemia   . Hyperopia with astigmatism and presbyopia   . Hypertension   . Iris nevus   . MVP (mitral valve prolapse)   . Osteopenia   . Osteoporosis   . Posterior vitreous detachment, bilateral   . Retinal drusen, bilateral   . Sjogrens syndrome ([place])   . Vitamin D deficiency     Past Surgical History:  Procedure Laterality Date  . ABDOMINAL HYSTERECTOMY    . COLON SURGERY    . COLONOSCOPY W/ BIOPSIES  06/18/2005  . TONSILLECTOMY      Family History  Problem Relation Age of Onset  . Hypertension Mother   . Cancer Mother   . Stroke Mother   . Heart disease Mother   . Heart failure Mother   . Heart disease Father   . Hyperlipidemia Father   . Hypertension Father   . Heart attack Father   .  Arthritis Sister   . Hypertension Sister   . Kidney disease Sister   . Stroke Sister   . Heart Problems Sister   . Heart disease Sister   . Heart Problems Brother   . Heart Problems Brother   . Heart Problems Brother   . Heart Problems Brother   . Heart Problems Sister   . Hypertension Sister   . Breast cancer Neg Hx     Social History Social History   Tobacco Use  . Smoking status: Never Smoker  . Smokeless tobacco: Never Used  Substance Use Topics  . Alcohol use: No  . Drug use: No    Allergies  Allergen Reactions  . Lisinopril Cough    Current Outpatient Medications  Medication Sig Dispense Refill  . ALPRAZolam (XANAX) 0.5 MG tablet Take 1 tablet (0.5 mg total) by mouth at bedtime as needed for anxiety. 30 tablet 0  . atorvastatin (LIPITOR) 10 MG tablet Take 1 tablet (10 mg total) by mouth daily. 90 tablet 3  . CHOLECALCIFEROL PO Take by mouth.    . escitalopram (LEXAPRO) 5 MG tablet Take 1 tablet (5 mg total) by mouth daily. 90 tablet 2  . loteprednol (LOTEMAX) 0.5 %  ophthalmic suspension Place 1 drop into both eyes as needed.    . Multiple Vitamin (MULTI-VITAMINS) TABS Take by mouth.    . Omega-3 Fatty Acids (FISH OIL OMEGA-3 PO) Take by mouth.    . pantoprazole (PROTONIX) 40 MG tablet Take 40 mg by mouth daily.    . Polyethylene Glycol 3350 (MIRALAX PO) Take by mouth.    . propranolol (INDERAL) 10 MG tablet Take 1 tablet (10 mg total) by mouth 3 (three) times daily as needed. 270 tablet 2   No current facility-administered medications for this visit.      Review of Systems Full ROS  was asked and was negative except for the information on the HPI  Physical Exam Blood pressure (!) 152/85, pulse 77, temperature 97.7 F (36.5 C), temperature source Temporal, height 5\' 5"  (1.651 m), weight 130 lb 12.8 oz (59.3 kg). CONSTITUTIONAL: NAD EYES: Pupils are equal, round, and reactive to light, Sclera are non-icteric. EARS, NOSE, MOUTH AND THROAT: The oropharynx is  clear. The oral mucosa is pink and moist. Hearing is intact to voice. LYMPH NODES:  Lymph nodes in the neck are normal. RESPIRATORY:  Lungs are clear. There is normal respiratory effort, with equal breath sounds bilaterally, and without pathologic use of accessory muscles. CARDIOVASCULAR: Heart is regular without murmurs, gallops, or rubs. GI: The abdomen is soft, nontender, and nondistended. There are no palpable masses. There is no hepatosplenomegaly. There are normal bowel sounds in all quadrants. Rectal: Evidence of increased rectal tone.  There is an anterolateral fissure.  Exam is very tender to palpation.  No obvious masses MUSCULOSKELETAL: Normal muscle strength and tone. No cyanosis or edema.   SKIN: Turgor is good and there are no pathologic skin lesions or ulcers. NEUROLOGIC: Motor and sensation is grossly normal. Cranial nerves are grossly intact. PSYCH:  Oriented to person, place and time. Affect is normal.  Data Reviewed  I have personally reviewed the patient's imaging, laboratory findings and medical records.    Assessment/Plan Anal fissure.  Discussed with the patient detail about optimization of medical therapy to include fiber, sitz baths and nifedipine cream twice a day.  I will see her back in about 3 weeks and she may need chemical sphincterotomy with bottox.  At this time there is no need for emergent surgical intervention.  Extensive counseling provided to the patient        Caroleen Hamman, MD FACS General Surgeon 09/05/2018, 12:24 PM

## 2018-09-22 ENCOUNTER — Other Ambulatory Visit (INDEPENDENT_AMBULATORY_CARE_PROVIDER_SITE_OTHER): Payer: Medicare Other

## 2018-09-22 DIAGNOSIS — N179 Acute kidney failure, unspecified: Secondary | ICD-10-CM

## 2018-09-22 LAB — BASIC METABOLIC PANEL
BUN: 15 mg/dL (ref 6–23)
CALCIUM: 9.1 mg/dL (ref 8.4–10.5)
CO2: 31 mEq/L (ref 19–32)
CREATININE: 0.91 mg/dL (ref 0.40–1.20)
Chloride: 105 mEq/L (ref 96–112)
GFR: 63.5 mL/min (ref >60.00)
GLUCOSE: 88 mg/dL (ref 70–99)
Potassium: 4.4 mEq/L (ref 3.5–5.1)
Sodium: 140 mEq/L (ref 135–145)

## 2018-09-26 ENCOUNTER — Encounter: Payer: Self-pay | Admitting: *Deleted

## 2018-09-26 ENCOUNTER — Ambulatory Visit (INDEPENDENT_AMBULATORY_CARE_PROVIDER_SITE_OTHER): Payer: Medicare Other | Admitting: Surgery

## 2018-09-26 ENCOUNTER — Encounter: Payer: Self-pay | Admitting: Surgery

## 2018-09-26 VITALS — BP 153/78 | HR 90 | Temp 97.9°F | Ht 66.0 in | Wt 132.0 lb

## 2018-09-26 DIAGNOSIS — K602 Anal fissure, unspecified: Secondary | ICD-10-CM | POA: Diagnosis not present

## 2018-09-26 DIAGNOSIS — I2510 Atherosclerotic heart disease of native coronary artery without angina pectoris: Secondary | ICD-10-CM | POA: Diagnosis not present

## 2018-09-26 NOTE — Progress Notes (Signed)
Patient's surgery has been scheduled for 10-11-18 at [facility] with Dr. Dahlia Byes.   The patient is aware of Pre-admission Testing appointment date and time.   The patient is aware to call the office should they have further questions.

## 2018-09-26 NOTE — Patient Instructions (Addendum)
Anal Fissure, Adult An anal fissure is a small tear or crack in the skin around the opening of the butt (anus).Bleeding from the tear or crack usually stops on its own within a few minutes. The bleeding may happen every time you poop (have a bowel movement) until the tear or crack heals. Follow these instructions at home: Eating and drinking  Avoid bananas and dairy products. These foods can make it hard to poop.  Drink enough fluid to keep your pee (urine) clear or pale yellow.  Eat a lot of fruit, whole grains, and vegetables. General instructions  Keep the butt area as clean and dry as you can.  Take a warm water bath (sitz bath) as told by your doctor. Do not use soap.  Take over-the-counter and prescription medicines only as told by your doctor.  Use creams or ointments only as told by your doctor.  Keep all follow-up visits as told by your doctor. This is important. Contact a doctor if:  You have more bleeding.  You have a fever.  You have watery poop (diarrhea) that is mixed with blood.  You have pain.  You problem gets worse, not better. This information is not intended to replace advice given to you by your health care provider. Make sure you discuss any questions you have with your health care provider. Document Released: 07/08/2011 Document Revised: 04/16/2016 Document Reviewed: 02/04/2015 Elsevier Interactive Patient Education  [employer].

## 2018-09-26 NOTE — Progress Notes (Signed)
Outpatient Surgical Follow Up  09/26/2018  Barbara Garcia is an 77 y.o. female.   Chief Complaint  Patient presents with  . Follow-up    rectal fissure     HPI: Barbara Garcia is a 77 year old female following up after an anal fissure.  She has started on nifedipine cream sitz bath's and has been having some issues with getting her fiber.  She states that she feels a little bit better but she continues to have intermittent hematochezia and pain when having a bowel movement.  The pain is sharp and moderate intensity.  Past Medical History:  Diagnosis Date  . Allergy   . Anxiety   . Arthritis   . Benign essential tremor   . Chronic dermatitis   . Colon polyps   . Combined form of senile cataract   . Constipation   . GERD (gastroesophageal reflux disease)   . Hearing loss   . Hemorrhoids   . History of cystocele   . History of shingles   . Hyperglycemia   . Hyperlipidemia   . Hyperopia with astigmatism and presbyopia   . Hypertension   . Iris nevus   . MVP (mitral valve prolapse)   . Osteopenia   . Osteoporosis   . Posterior vitreous detachment, bilateral   . Retinal drusen, bilateral   . Sjogrens syndrome ([place])   . Vitamin D deficiency     Past Surgical History:  Procedure Laterality Date  . ABDOMINAL HYSTERECTOMY    . COLON SURGERY    . COLONOSCOPY W/ BIOPSIES  06/18/2005  . TONSILLECTOMY      Family History  Problem Relation Age of Onset  . Hypertension Mother   . Cancer Mother   . Stroke Mother   . Heart disease Mother   . Heart failure Mother   . Heart disease Father   . Hyperlipidemia Father   . Hypertension Father   . Heart attack Father   . Arthritis Sister   . Hypertension Sister   . Kidney disease Sister   . Stroke Sister   . Heart Problems Sister   . Heart disease Sister   . Heart Problems Brother   . Heart Problems Brother   . Heart Problems Brother   . Heart Problems Brother   . Heart Problems Sister   . Hypertension Sister   . Breast  cancer Neg Hx     Social History:  reports that she has never smoked. She has never used smokeless tobacco. She reports that she does not drink alcohol or use drugs.  Allergies:  Allergies  Allergen Reactions  . Lisinopril Cough    Medications reviewed.    ROS Full ROS performed and is otherwise negative other than what is stated in HPI   BP (!) 153/78   Pulse 90   Temp 97.9 F (36.6 C) (Skin)   Ht 5\' 6"  (1.676 m)   Wt 132 lb (59.9 kg)   BMI 21.31 kg/m   Physical Exam  Constitutional: She is oriented to person, place, and time. She appears well-developed and well-nourished. No distress.  Neck: Normal range of motion. Neck supple. No tracheal deviation present. No thyromegaly present.  Cardiovascular: Normal rate and regular rhythm.  Pulmonary/Chest: Effort normal and breath sounds normal. No respiratory distress.  Abdominal: Soft. She exhibits no distension and no mass. There is no tenderness. There is no rebound and no guarding.  Genitourinary:  Genitourinary Comments: Crease in his sphincter tone of the anal sphincter.  There is evidence  of anterolateral fissure and is tender to palpation on the anterior area.  No other rectal lesions  Musculoskeletal: Normal range of motion. She exhibits no edema or deformity.  Neurological: She is alert and oriented to person, place, and time.  Skin: Skin is warm. Capillary refill takes less than 2 seconds.  Psychiatric: She has a normal mood and affect. Her behavior is normal. Judgment and thought content normal.  Nursing note and vitals reviewed.      Assessment/Plan: 77 year old female with anal fissure only partially responsive to medical treatment.  Discussed with the patient in detail about options of chemical versus surgical sphincterotomy.  She is interested in pursuing chemical sphincterotomy with Botox.  She was discussed with the patient detail.  Risk benefit and possible complications including but not limited to:  Bleeding, infection, persistence of symptoms.  Chronic pain, incontinence.  She understands and wishes to proceed.   Greater than 50% of the 25 minutes  visit was spent in counseling/coordination of care   Caroleen Hamman, MD [place] Surgeon

## 2018-09-26 NOTE — H&P (View-Only) (Signed)
Outpatient Surgical Follow Up  09/26/2018  Barbara Garcia is an 77 y.o. female.   Chief Complaint  Patient presents with  . Follow-up    rectal fissure     HPI: Ms. Gillham is a 77 year old female following up after an anal fissure.  She has started on nifedipine cream sitz bath's and has been having some issues with getting her fiber.  She states that she feels a little bit better but she continues to have intermittent hematochezia and pain when having a bowel movement.  The pain is sharp and moderate intensity.  Past Medical History:  Diagnosis Date  . Allergy   . Anxiety   . Arthritis   . Benign essential tremor   . Chronic dermatitis   . Colon polyps   . Combined form of senile cataract   . Constipation   . GERD (gastroesophageal reflux disease)   . Hearing loss   . Hemorrhoids   . History of cystocele   . History of shingles   . Hyperglycemia   . Hyperlipidemia   . Hyperopia with astigmatism and presbyopia   . Hypertension   . Iris nevus   . MVP (mitral valve prolapse)   . Osteopenia   . Osteoporosis   . Posterior vitreous detachment, bilateral   . Retinal drusen, bilateral   . Sjogrens syndrome ([place])   . Vitamin D deficiency     Past Surgical History:  Procedure Laterality Date  . ABDOMINAL HYSTERECTOMY    . COLON SURGERY    . COLONOSCOPY W/ BIOPSIES  06/18/2005  . TONSILLECTOMY      Family History  Problem Relation Age of Onset  . Hypertension Mother   . Cancer Mother   . Stroke Mother   . Heart disease Mother   . Heart failure Mother   . Heart disease Father   . Hyperlipidemia Father   . Hypertension Father   . Heart attack Father   . Arthritis Sister   . Hypertension Sister   . Kidney disease Sister   . Stroke Sister   . Heart Problems Sister   . Heart disease Sister   . Heart Problems Brother   . Heart Problems Brother   . Heart Problems Brother   . Heart Problems Brother   . Heart Problems Sister   . Hypertension Sister   . Breast  cancer Neg Hx     Social History:  reports that she has never smoked. She has never used smokeless tobacco. She reports that she does not drink alcohol or use drugs.  Allergies:  Allergies  Allergen Reactions  . Lisinopril Cough    Medications reviewed.    ROS Full ROS performed and is otherwise negative other than what is stated in HPI   BP (!) 153/78   Pulse 90   Temp 97.9 F (36.6 C) (Skin)   Ht 5\' 6"  (1.676 m)   Wt 132 lb (59.9 kg)   BMI 21.31 kg/m   Physical Exam  Constitutional: She is oriented to person, place, and time. She appears well-developed and well-nourished. No distress.  Neck: Normal range of motion. Neck supple. No tracheal deviation present. No thyromegaly present.  Cardiovascular: Normal rate and regular rhythm.  Pulmonary/Chest: Effort normal and breath sounds normal. No respiratory distress.  Abdominal: Soft. She exhibits no distension and no mass. There is no tenderness. There is no rebound and no guarding.  Genitourinary:  Genitourinary Comments: Crease in his sphincter tone of the anal sphincter.  There is evidence  of anterolateral fissure and is tender to palpation on the anterior area.  No other rectal lesions  Musculoskeletal: Normal range of motion. She exhibits no edema or deformity.  Neurological: She is alert and oriented to person, place, and time.  Skin: Skin is warm. Capillary refill takes less than 2 seconds.  Psychiatric: She has a normal mood and affect. Her behavior is normal. Judgment and thought content normal.  Nursing note and vitals reviewed.      Assessment/Plan: 77 year old female with anal fissure only partially responsive to medical treatment.  Discussed with the patient in detail about options of chemical versus surgical sphincterotomy.  She is interested in pursuing chemical sphincterotomy with Botox.  She was discussed with the patient detail.  Risk benefit and possible complications including but not limited to:  Bleeding, infection, persistence of symptoms.  Chronic pain, incontinence.  She understands and wishes to proceed.   Greater than 50% of the 25 minutes  visit was spent in counseling/coordination of care   Caroleen Hamman, MD [place] Surgeon

## 2018-09-28 ENCOUNTER — Ambulatory Visit (INDEPENDENT_AMBULATORY_CARE_PROVIDER_SITE_OTHER): Payer: Medicare Other

## 2018-09-28 VITALS — BP 124/70 | HR 72 | Temp 97.9°F | Resp 15 | Ht 64.5 in | Wt 132.8 lb

## 2018-09-28 DIAGNOSIS — Z0000 Encounter for general adult medical examination without abnormal findings: Secondary | ICD-10-CM | POA: Diagnosis not present

## 2018-09-28 NOTE — Patient Instructions (Addendum)
  Barbara Garcia , Thank you for taking time to come for your Medicare Wellness Visit. I appreciate your ongoing commitment to your health goals. Please review the following plan we discussed and let me know if I can assist you in the future.   Follow up as needed.    Bring a copy of your [place] and/or Living Will to be scanned into chart.  Have a great day!  These are the goals we discussed: Goals      Patient Stated   . Increase physical activity (pt-stated)     Join the [employer] senior program Walk for exercise       This is a list of the screening recommended for you and due dates:  Health Maintenance  Topic Date Due  . Tetanus Vaccine  02/18/2021  . Flu Shot  Completed  . DEXA scan (bone density measurement)  Completed  . Pneumonia vaccines  Completed

## 2018-09-28 NOTE — Progress Notes (Signed)
Subjective:   Barbara Garcia is a 77 y.o. female who presents for Medicare Annual (Subsequent) preventive examination.  Review of Systems:  No ROS.  Medicare Wellness Visit. Additional risk factors are reflected in the social history. Cardiac Risk Factors include: advanced age (>5men, >92 women)     Objective:     Vitals: BP 124/70 (BP Location: Left Arm, Patient Position: Sitting, Cuff Size: Normal)   Pulse 72   Temp 97.9 F (36.6 C) (Oral)   Resp 15   Ht 5' 4.5" (1.638 m)   Wt 132 lb 12.8 oz (60.2 kg)   SpO2 98%   BMI 22.44 kg/m   Body mass index is 22.44 kg/m.  Advanced Directives 09/28/2018 09/27/2017  Does Patient Have a Medical Advance Directive? Yes Yes  Type of Paramedic of [place];Living will Living will;Healthcare Power of Attorney  Does patient want to make changes to medical advance directive? No - Patient declined No - Patient declined  Copy of [place] in Chart? No - copy requested No - copy requested    Tobacco Social History   Tobacco Use  Smoking Status Never Smoker  Smokeless Tobacco Never Used     Counseling given: Not Answered   Clinical Intake:  Pre-visit preparation completed: Yes  Pain : No/denies pain     Nutritional Status: BMI of 19-24  Normal Diabetes: No  How often do you need to have someone help you when you read instructions, pamphlets, or other written materials from your doctor or pharmacy?: 1 - Never  Interpreter Needed?: No     Past Medical History:  Diagnosis Date  . Allergy   . Anxiety   . Arthritis   . Benign essential tremor   . Chronic dermatitis   . Colon polyps   . Combined form of senile cataract   . Constipation   . GERD (gastroesophageal reflux disease)   . Hearing loss   . Hemorrhoids   . History of cystocele   . History of shingles   . Hyperglycemia   . Hyperlipidemia   . Hyperopia with astigmatism and presbyopia   . Hypertension   . Iris nevus     . MVP (mitral valve prolapse)   . Osteopenia   . Osteoporosis   . Posterior vitreous detachment, bilateral   . Retinal drusen, bilateral   . Sjogrens syndrome ([place])   . Vitamin D deficiency    Past Surgical History:  Procedure Laterality Date  . ABDOMINAL HYSTERECTOMY    . COLON SURGERY    . COLONOSCOPY W/ BIOPSIES  06/18/2005  . TONSILLECTOMY     Family History  Problem Relation Age of Onset  . Hypertension Mother   . Cancer Mother   . Stroke Mother   . Heart disease Mother   . Heart failure Mother   . Heart disease Father   . Hyperlipidemia Father   . Hypertension Father   . Heart attack Father   . Arthritis Sister   . Hypertension Sister   . Kidney disease Sister   . Stroke Sister   . Heart Problems Sister   . Heart disease Sister   . Heart Problems Brother   . Heart Problems Brother   . Heart Problems Brother   . Heart Problems Brother   . Heart Problems Sister   . Hypertension Sister   . Breast cancer Neg Hx    Social History   Socioeconomic History  . Marital status: Married  Spouse name: Not on file  . Number of children: Not on file  . Years of education: Not on file  . Highest education level: Not on file  Occupational History  . Not on file  Social Needs  . Financial resource strain: Not hard at all  . Food insecurity:    Worry: Never true    Inability: Never true  . Transportation needs:    Medical: No    Non-medical: No  Tobacco Use  . Smoking status: Never Smoker  . Smokeless tobacco: Never Used  Substance and Sexual Activity  . Alcohol use: No  . Drug use: No  . Sexual activity: Never    Partners: Male  Lifestyle  . Physical activity:    Days per week: 0 days    Minutes per session: Not on file  . Stress: Not on file  Relationships  . Social connections:    Talks on phone: Not on file    Gets together: Not on file    Attends religious service: Not on file    Active member of club or organization: Not on file    Attends  meetings of clubs or organizations: Not on file    Relationship status: Not on file  Other Topics Concern  . Not on file  Social History Narrative  . Not on file    Outpatient Encounter Medications as of 09/28/2018  Medication Sig  . ALPRAZolam (XANAX) 0.5 MG tablet Take 1 tablet (0.5 mg total) by mouth at bedtime as needed for anxiety.  Marland Kitchen atorvastatin (LIPITOR) 10 MG tablet Take 1 tablet (10 mg total) by mouth daily.  Marland Kitchen escitalopram (LEXAPRO) 5 MG tablet Take 1 tablet (5 mg total) by mouth daily.  . Multiple Vitamin (MULTI-VITAMINS) TABS Take 1 tablet by mouth 2 (two) times a week.   . pantoprazole (PROTONIX) 40 MG tablet Take 40 mg by mouth daily.  . Polyvinyl Alcohol-Povidone PF (REFRESH) 1.4-0.6 % SOLN Place 1 drop into both eyes daily as needed (for dry eyes).  . Probiotic Product ([employer] PROBIOTIC) CHEW Chew 2 each by mouth daily.  . propranolol (INDERAL) 10 MG tablet Take 1 tablet (10 mg total) by mouth 3 (three) times daily as needed. (Patient taking differently: Take 10 mg by mouth 3 (three) times daily as needed (for shaking). )   No facility-administered encounter medications on file as of 09/28/2018.     Activities of Daily Living In your present state of health, do you have any difficulty performing the following activities: 09/28/2018  Hearing? Y  Comment Hearing aids  Vision? N  Difficulty concentrating or making decisions? N  Walking or climbing stairs? N  Dressing or bathing? N  Doing errands, shopping? N  Preparing Food and eating ? N  Using the Toilet? N  In the past six months, have you accidently leaked urine? N  Do you have problems with loss of bowel control? N  Managing your Medications? N  Managing your Finances? N  Housekeeping or managing your Housekeeping? N  Some recent data might be hidden    Patient Care Team: Leone Haven, MD as PCP - General (Family Medicine)    Assessment:   This is a routine wellness examination for Vandana.  The goal of  the wellness visit is to assist the patient how to close the gaps in care and create a preventative care plan for the patient.   The roster of all physicians providing medical care to patient is listed in the  Snapshot section of the chart.  Osteopenia. Taking calcium VIT D as appropriate/Osteoporosis risk reviewed.    Safety issues reviewed; Smoke and carbon monoxide detectors in the home. No firearms in the home. Wears seatbelts when driving or riding with others. No violence in the home.  They do not have excessive sun exposure.  Discussed the need for sun protection: hats, long sleeves and the use of sunscreen if there is significant sun exposure.  Patient is alert, normal appearance, oriented to person/place/and time. Correctly identified the president of the [place] and recalls of 3/3 words.Performs simple calculations and can read correct time from watch face.  Displays appropriate judgement.  No new identified risk were noted.  No failures at ADL's or IADL's.   BMI- discussed the importance of a healthy diet, water intake and the benefits of aerobic exercise.She pans to start walking for exercise, has a healthy diet and adequate water intake.   Dental- UTD.  Eye- Visual acuity not assessed per patient preference since they have regular follow up with the ophthalmologist.  Wears corrective lenses.  Sleep patterns- Sleeps fair through the night.   Health maintenance gaps- closed.  Patient Concerns: None at this time. Follow up with PCP as needed.  Exercise Activities and Dietary recommendations Current Exercise Habits: The patient does not participate in regular exercise at present  Goals      Patient Stated   . Increase physical activity (pt-stated)     Join the [employer] senior program Walk for exercise       Fall Risk Fall Risk  08/24/2018 09/27/2017 03/17/2017  Falls in the past year? No No No   Depression Screen PHQ 2/9 Scores 08/24/2018 09/27/2017 03/17/2017  PHQ - 2  Score 0 0 0     Cognitive Function MMSE - Mini Mental State Exam 09/27/2017  Orientation to time 5  Orientation to Place 5  Registration 3  Attention/ Calculation 5  Recall 3  Language- name 2 objects 2  Language- repeat 1  Language- follow 3 step command 3  Language- read & follow direction 1  Write a sentence 1  Copy design 1  Total score 30     6CIT Screen 09/28/2018  What Year? 0 points  What month? 0 points  What time? 0 points  Count back from 20 0 points  Months in reverse 0 points  Repeat phrase 0 points  Total Score 0    Immunization History  Administered Date(s) Administered  . Hepatitis A, Ped/Adol-2 Dose 05/16/1960  . Influenza, High Dose Seasonal PF 10/02/2016, 09/14/2017, 08/24/2018  . Pneumococcal Conjugate-13 12/07/2013  . Pneumococcal Polysaccharide-23 07/06/2006  . Tdap 07/06/2006, 02/19/2011  . Zoster Recombinat (Shingrix) 02/19/2018   Screening Tests Health Maintenance  Topic Date Due  . TETANUS/TDAP  02/18/2021  . INFLUENZA VACCINE  Completed  . DEXA SCAN  Completed  . PNA vac Low Risk Adult  Completed      Plan:    End of life planning; Advance aging; Advanced directives discussed. Copy of current HCPOA/Living Will requested.    I have personally reviewed and noted the following in the patient's chart:   . Medical and social history . Use of alcohol, tobacco or illicit drugs  . Current medications and supplements . Functional ability and status . Nutritional status . Physical activity . Advanced directives . List of other physicians . Hospitalizations, surgeries, and ER visits in previous 12 months . Vitals . Screenings to include cognitive, depression, and falls . Referrals and appointments  In addition, I have reviewed and discussed with patient certain preventive protocols, quality metrics, and best practice recommendations. A written personalized care plan for preventive services as well as general preventive health  recommendations were provided to patient.     Varney Biles, LPN  [id number]/01/1739

## 2018-09-30 ENCOUNTER — Other Ambulatory Visit: Payer: Self-pay

## 2018-09-30 ENCOUNTER — Encounter
Admission: RE | Admit: 2018-09-30 | Discharge: 2018-09-30 | Disposition: A | Discharge status: Home / Self Care | Payer: Medicare Other | Source: Ambulatory Visit | Attending: Surgery | Admitting: Surgery

## 2018-09-30 NOTE — Progress Notes (Signed)
I have reviewed the above note and agree.  Tredarius Cobern, M.D.  

## 2018-09-30 NOTE — Patient Instructions (Signed)
Your procedure is scheduled on: Tues 11/19 Report to Day Surgery. To find out your arrival time please call [phone] between 1PM - 3PM on Mon 11/18.  Remember: Instructions that are not followed completely may result in serious medical risk,  up to and including death, or upon the discretion of your surgeon and anesthesiologist your  surgery may need to be rescheduled.     _X__ 1. Do not eat food after midnight the night before your procedure.                 No gum chewing or hard candies. You may drink clear liquids up to 2 hours                 before you are scheduled to arrive for your surgery- DO not drink clear                 liquids within 2 hours of the start of your surgery.                 Clear Liquids include:  water, apple juice without pulp, clear carbohydrate                 drink such as Clearfast of Gatorade, Black Coffee or Tea (Do not add                 anything to coffee or tea).  __X__2.  On the morning of surgery brush your teeth with toothpaste and water, you                may rinse your mouth with mouthwash if you wish.  Do not swallow any toothpaste of mouthwash.     _X__ 3.  No Alcohol for 24 hours before or after surgery.   ___ 4.  Do Not Smoke or use e-cigarettes For 24 Hours Prior to Your Surgery.                 Do not use any chewable tobacco products for at least 6 hours prior to                 surgery.  ____  5.  Bring all medications with you on the day of surgery if instructed.   __x__  6.  Notify your doctor if there is any change in your medical condition      (cold, fever, infections).     Do not wear jewelry, make-up, hairpins, clips or nail polish. Do not wear lotions, powders, or perfumes. You may wear deodorant. Do not shave 48 hours prior to surgery. Men may shave face and neck. Do not bring valuables to the hospital.    [facility] is not responsible for any belongings or valuables.  Contacts, dentures  or bridgework may not be worn into surgery. Leave your suitcase in the car. After surgery it may be brought to your room. For patients admitted to the hospital, discharge time is determined by your treatment team.   Patients discharged the day of surgery will not be allowed to drive home.   Please read over the following fact sheets that you were given:    __x__ Take these medicines the morning of surgery with A SIP OF WATER:    1. ALPRAZolam (XANAX) 0.5 MG tablet if needed  2. escitalopram (LEXAPRO) 5 MG tablet  3. pantoprazole (PROTONIX) 40 MG tablet take extra dose the night before and one the morning of surgery  4.  5.  6.  ____ Fleet Enema (as directed)   ____ Use CHG Soap as directed  ____ Use inhalers on the day of surgery  ____ Stop metformin 2 days prior to surgery    ____ Take 1/2 of usual insulin dose the night before surgery. No insulin the morning          of surgery.   ____ Stop Coumadin/Plavix/aspirin on   __x__ Stop Anti-inflammatories Ibuprofen or Aleve or Aspirin  On 11/11    _x___ Stop supplements until after surgery.  Fish oil Last dose on 11/11  ____ Bring C-Pap to the hospital.

## 2018-10-11 ENCOUNTER — Encounter: Payer: Self-pay | Admitting: *Deleted

## 2018-10-11 ENCOUNTER — Ambulatory Visit: Payer: Medicare Other | Admitting: Anesthesiology

## 2018-10-11 ENCOUNTER — Ambulatory Visit
Admission: RE | Admit: 2018-10-11 | Discharge: 2018-10-11 | Disposition: A | Discharge status: Home / Self Care | Payer: Medicare Other | Source: Ambulatory Visit | Attending: Surgery | Admitting: Surgery

## 2018-10-11 ENCOUNTER — Other Ambulatory Visit: Payer: Self-pay

## 2018-10-11 ENCOUNTER — Encounter
Admission: RE | Disposition: A | Discharge status: Home / Self Care | Payer: Self-pay | Source: Ambulatory Visit | Attending: Surgery

## 2018-10-11 DIAGNOSIS — I341 Nonrheumatic mitral (valve) prolapse: Secondary | ICD-10-CM | POA: Insufficient documentation

## 2018-10-11 DIAGNOSIS — Z8249 Family history of ischemic heart disease and other diseases of the circulatory system: Secondary | ICD-10-CM | POA: Diagnosis not present

## 2018-10-11 DIAGNOSIS — I10 Essential (primary) hypertension: Secondary | ICD-10-CM | POA: Insufficient documentation

## 2018-10-11 DIAGNOSIS — G250 Essential tremor: Secondary | ICD-10-CM | POA: Insufficient documentation

## 2018-10-11 DIAGNOSIS — E785 Hyperlipidemia, unspecified: Secondary | ICD-10-CM | POA: Diagnosis not present

## 2018-10-11 DIAGNOSIS — Z79899 Other long term (current) drug therapy: Secondary | ICD-10-CM | POA: Diagnosis not present

## 2018-10-11 DIAGNOSIS — K602 Anal fissure, unspecified: Secondary | ICD-10-CM

## 2018-10-11 DIAGNOSIS — G8929 Other chronic pain: Secondary | ICD-10-CM | POA: Insufficient documentation

## 2018-10-11 DIAGNOSIS — M3500 Sicca syndrome, unspecified: Secondary | ICD-10-CM | POA: Insufficient documentation

## 2018-10-11 DIAGNOSIS — F419 Anxiety disorder, unspecified: Secondary | ICD-10-CM | POA: Insufficient documentation

## 2018-10-11 DIAGNOSIS — Z888 Allergy status to other drugs, medicaments and biological substances status: Secondary | ICD-10-CM | POA: Diagnosis not present

## 2018-10-11 HISTORY — PX: BOTOX INJECTION: SHX5754

## 2018-10-11 SURGERY — EXAM UNDER ANESTHESIA
Anesthesia: General

## 2018-10-11 MED ORDER — DEXAMETHASONE SODIUM PHOSPHATE 10 MG/ML IJ SOLN
INTRAMUSCULAR | Status: AC
  Filled 2018-10-11: qty 1

## 2018-10-11 MED ORDER — GABAPENTIN 300 MG PO CAPS
ORAL_CAPSULE | ORAL | Status: AC
  Filled 2018-10-11: qty 1

## 2018-10-11 MED ORDER — FENTANYL CITRATE (PF) 100 MCG/2ML IJ SOLN: 25.0000 ug | INTRAMUSCULAR | Status: DC | PRN

## 2018-10-11 MED ORDER — HYDROCODONE-ACETAMINOPHEN 5-325 MG PO TABS: 1.0000 | ORAL_TABLET | Freq: Four times a day (QID) | ORAL | 0 refills | Status: DC | PRN

## 2018-10-11 MED ORDER — SODIUM CHLORIDE (PF) 0.9 % IJ SOLN
INTRAMUSCULAR | Status: DC | PRN
  Administered 2018-10-11: 5 mL via SUBMUCOSAL

## 2018-10-11 MED ORDER — PROPOFOL 10 MG/ML IV BOLUS
INTRAVENOUS | Status: AC
  Filled 2018-10-11: qty 20

## 2018-10-11 MED ORDER — BUPIVACAINE LIPOSOME 1.3 % IJ SUSP
INTRAMUSCULAR | Status: DC | PRN
  Administered 2018-10-11: 20 mL

## 2018-10-11 MED ORDER — BUPIVACAINE LIPOSOME 1.3 % IJ SUSP
INTRAMUSCULAR | Status: AC
  Filled 2018-10-11: qty 20

## 2018-10-11 MED ORDER — DEXAMETHASONE SODIUM PHOSPHATE 10 MG/ML IJ SOLN
INTRAMUSCULAR | Status: DC | PRN
  Administered 2018-10-11: 5 mg via INTRAVENOUS

## 2018-10-11 MED ORDER — ONDANSETRON HCL 4 MG/2ML IJ SOLN: 4.0000 mg | Freq: Once | INTRAMUSCULAR | Status: DC | PRN

## 2018-10-11 MED ORDER — FENTANYL CITRATE (PF) 100 MCG/2ML IJ SOLN
INTRAMUSCULAR | Status: AC
  Filled 2018-10-11: qty 2

## 2018-10-11 MED ORDER — SODIUM CHLORIDE (PF) 0.9 % IJ SOLN
INTRAMUSCULAR | Status: AC
  Filled 2018-10-11: qty 10

## 2018-10-11 MED ORDER — FENTANYL CITRATE (PF) 100 MCG/2ML IJ SOLN
INTRAMUSCULAR | Status: DC | PRN
  Administered 2018-10-11: 25 ug via INTRAVENOUS

## 2018-10-11 MED ORDER — ONABOTULINUMTOXINA 100 UNITS IJ SOLR
50.0000 [IU] | Freq: Once | INTRAMUSCULAR | Status: DC
  Filled 2018-10-11: qty 100

## 2018-10-11 MED ORDER — ONDANSETRON HCL 4 MG/2ML IJ SOLN
INTRAMUSCULAR | Status: AC
  Filled 2018-10-11: qty 2

## 2018-10-11 MED ORDER — ONDANSETRON HCL 4 MG/2ML IJ SOLN
INTRAMUSCULAR | Status: DC | PRN
  Administered 2018-10-11: 4 mg via INTRAVENOUS

## 2018-10-11 MED ORDER — LACTATED RINGERS IV SOLN
INTRAVENOUS | Status: DC
  Administered 2018-10-11: 09:00:00 via INTRAVENOUS

## 2018-10-11 MED ORDER — ACETAMINOPHEN 500 MG PO TABS
1000.0000 mg | ORAL_TABLET | ORAL | Status: AC
  Administered 2018-10-11: 1000 mg via ORAL

## 2018-10-11 MED ORDER — FAMOTIDINE 20 MG PO TABS
20.0000 mg | ORAL_TABLET | Freq: Once | ORAL | Status: AC
  Administered 2018-10-11: 20 mg via ORAL

## 2018-10-11 MED ORDER — FAMOTIDINE 20 MG PO TABS
ORAL_TABLET | ORAL | Status: AC
  Filled 2018-10-11: qty 1

## 2018-10-11 MED ORDER — ACETAMINOPHEN 500 MG PO TABS
ORAL_TABLET | ORAL | Status: AC
  Filled 2018-10-11: qty 2

## 2018-10-11 MED ORDER — PROPOFOL 10 MG/ML IV BOLUS
INTRAVENOUS | Status: DC | PRN
  Administered 2018-10-11: 30 mg via INTRAVENOUS

## 2018-10-11 MED ORDER — PROPOFOL 500 MG/50ML IV EMUL
INTRAVENOUS | Status: DC | PRN
  Administered 2018-10-11: 50 ug/kg/min via INTRAVENOUS

## 2018-10-11 MED ORDER — LIDOCAINE HCL (PF) 2 % IJ SOLN
INTRAMUSCULAR | Status: AC
  Filled 2018-10-11: qty 10

## 2018-10-11 MED ORDER — LIDOCAINE HCL (CARDIAC) PF 100 MG/5ML IV SOSY
PREFILLED_SYRINGE | INTRAVENOUS | Status: DC | PRN
  Administered 2018-10-11: 60 mg via INTRAVENOUS

## 2018-10-11 MED ORDER — CHLORHEXIDINE GLUCONATE CLOTH 2 % EX PADS: 6.0000 | MEDICATED_PAD | Freq: Once | CUTANEOUS | Status: DC

## 2018-10-11 MED ORDER — GABAPENTIN 300 MG PO CAPS
300.0000 mg | ORAL_CAPSULE | ORAL | Status: AC
  Administered 2018-10-11: 300 mg via ORAL

## 2018-10-11 SURGICAL SUPPLY — 15 items
CANISTER SUCT 3000ML PPV (MISCELLANEOUS) ×3 IMPLANT
COVER WAND RF STERILE (DRAPES) ×3 IMPLANT
DRAPE LEGGINS SURG 28X43 STRL (DRAPES) ×3 IMPLANT
DRAPE SHEET LG 3/4 BI-LAMINATE (DRAPES) ×3 IMPLANT
DRAPE UNDER BUTTOCK W/FLU (DRAPES) ×3 IMPLANT
ELECT REM PT RETURN 9FT ADLT (ELECTROSURGICAL) ×3
ELECTRODE REM PT RTRN 9FT ADLT (ELECTROSURGICAL) ×1 IMPLANT
GLOVE BIO SURGEON STRL SZ7 (GLOVE) ×3 IMPLANT
GOWN STRL REUS W/ TWL LRG LVL3 (GOWN DISPOSABLE) ×2 IMPLANT
GOWN STRL REUS W/TWL LRG LVL3 (GOWN DISPOSABLE) ×4
NS IRRIG 1000ML POUR BTL (IV SOLUTION) ×3 IMPLANT
PACK BASIN MINOR ARMC (MISCELLANEOUS) ×3 IMPLANT
SOL PREP PVP 2OZ (MISCELLANEOUS) ×3
SOLUTION PREP PVP 2OZ (MISCELLANEOUS) ×1 IMPLANT
SURGILUBE 2OZ TUBE FLIPTOP (MISCELLANEOUS) ×3 IMPLANT

## 2018-10-11 NOTE — Anesthesia Procedure Notes (Signed)
Performed by: Aline Brochure, CRNA Pre-anesthesia Checklist: Patient identified, Emergency Drugs available, Suction available and Patient being monitored Oxygen Delivery Method: Simple face mask Ventilation: Oral airway inserted - appropriate to patient size Placement Confirmation: CO2 detector

## 2018-10-11 NOTE — Anesthesia Post-op Follow-up Note (Signed)
Anesthesia QCDR form completed.        

## 2018-10-11 NOTE — Anesthesia Postprocedure Evaluation (Signed)
Anesthesia Post Note  Patient: Barbara Garcia  Procedure(s) Performed: EXAM UNDER ANESTHESIA (N/A ) BOTOX INJECTION (N/A )  Patient location during evaluation: PACU Anesthesia Type: General Level of consciousness: awake and alert and oriented Pain management: pain level controlled Vital Signs Assessment: post-procedure vital signs reviewed and stable Respiratory status: spontaneous breathing, nonlabored ventilation and respiratory function stable Cardiovascular status: blood pressure returned to baseline and stable Postop Assessment: no signs of nausea or vomiting Anesthetic complications: no     Last Vitals:  Vitals:   10/11/18 1155 10/11/18 1210  BP: (!) 149/72 (!) 150/73  Pulse: 74 61  Resp: 16 16  Temp: (!) 36.1 C (!) 35.8 C  SpO2: 99% 100%    Last Pain:  Vitals:   10/11/18 1210  TempSrc: Temporal  PainSc: 0-No pain                 Kieran Arreguin

## 2018-10-11 NOTE — Anesthesia Preprocedure Evaluation (Signed)
Anesthesia Evaluation  Patient identified by MRN, date of birth, ID band Patient awake    Reviewed: Allergy & Precautions, NPO status , Patient's Chart, lab work & pertinent test results  History of Anesthesia Complications Negative for: history of anesthetic complications  Airway Mallampati: II  TM Distance: >3 FB Neck ROM: Full    Dental no notable dental hx.    Pulmonary neg pulmonary ROS, neg sleep apnea, neg COPD,    breath sounds clear to auscultation- rhonchi (-) wheezing      Cardiovascular hypertension, Pt. on medications (-) CAD, (-) Past MI, (-) Cardiac Stents and (-) CABG  Rhythm:Regular Rate:Normal - Systolic murmurs and - Diastolic murmurs    Neuro/Psych Anxiety negative neurological ROS     GI/Hepatic Neg liver ROS, GERD  ,  Endo/Other  negative endocrine ROSneg diabetes  Renal/GU negative Renal ROS     Musculoskeletal  (+) Arthritis ,   Abdominal (+) - obese,   Peds  Hematology negative hematology ROS (+)   Anesthesia Other Findings Past Medical History: No date: Allergy No date: Anxiety No date: Arthritis No date: Benign essential tremor No date: Chronic dermatitis No date: Colon polyps No date: Combined form of senile cataract No date: Constipation No date: GERD (gastroesophageal reflux disease) No date: Hearing loss No date: Hemorrhoids No date: History of cystocele No date: History of shingles No date: Hyperglycemia No date: Hyperlipidemia No date: Hyperopia with astigmatism and presbyopia No date: Hypertension No date: Iris nevus No date: MVP (mitral valve prolapse) No date: Osteopenia No date: Osteoporosis No date: Posterior vitreous detachment, bilateral 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, bilateral No date: Sjogrens syndrome (HCC) No date: Vitamin D deficiency   Reproductive/Obstetrics                             Anesthesia Physical Anesthesia Plan  ASA:  II  Anesthesia Plan: General   Post-op Pain Management:    Induction: Intravenous  PONV Risk Score and Plan: 2 and Propofol infusion  Airway Management Planned: Natural Airway  Additional Equipment:   Intra-op Plan:   Post-operative Plan:   Informed Consent: I have reviewed the patients History and Physical, chart, labs and discussed the procedure including the risks, benefits and alternatives for the proposed anesthesia with the patient or authorized representative who has indicated his/her understanding and acceptance.   Dental advisory given  Plan Discussed with: CRNA and Anesthesiologist  Anesthesia Plan Comments:         Anesthesia Quick Evaluation

## 2018-10-11 NOTE — Op Note (Signed)
  10/11/2018  10:47 AM  PATIENT:  Barbara Garcia  77 y.o. female  PRE-OPERATIVE DIAGNOSIS:  Anal Fissure  POST-OPERATIVE DIAGNOSIS:  Same  PROCEDURE:  EUA and Chemical sphincterotomy using 50 IU Botix    SURGEON:  Surgeon(s) and Role:    * Pabon, Diego F, MD - Primary  ANESTHESIA: GETA  DICTATION:  Patient was expained about procedure in detail, risk benefits possible complications and a consent was obtained. The patient taken to the operating room and placed in the lithotomy position  Exam revealed posterior fissure. Using bidigital exam the sphincter muscle was elevated an botox was injected in two separate places posteriorly. Liposomal Marcaine  was injected around the wound site. Needle and laparotomy counts were correct and there were no immediate complications  Jules Husbands, MD

## 2018-10-11 NOTE — Transfer of Care (Signed)
Immediate Anesthesia Transfer of Care Note  Patient: Barbara Garcia  Procedure(s) Performed: Jasmine December UNDER ANESTHESIA (N/A ) BOTOX INJECTION (N/A )  Patient Location: PACU  Anesthesia Type:General  Level of Consciousness: awake, alert  and oriented  Airway & Oxygen Therapy: Patient Spontanous Breathing  Post-op Assessment: Post -op Vital signs reviewed and stable  Post vital signs: stable  Last Vitals:  Vitals Value Taken Time  BP 136/70 10/11/2018 10:51 AM  Temp 36.6 C 10/11/2018 10:51 AM  Pulse 62 10/11/2018 10:51 AM  Resp 25 10/11/2018 10:51 AM  SpO2 100 % 10/11/2018 10:51 AM    Last Pain:  Vitals:   10/11/18 1051  TempSrc: Temporal  PainSc:          Complications: No apparent anesthesia complications

## 2018-10-11 NOTE — Interval H&P Note (Signed)
History and Physical Interval Note:  10/11/2018 9:49 AM  Barbara Garcia  has presented today for surgery, with the diagnosis of anal fissure  The various methods of treatment have been discussed with the patient and family. After consideration of risks, benefits and other options for treatment, the patient has consented to  Procedure(s): EXAM UNDER ANESTHESIA (N/A) BOTOX INJECTION (N/A) as a surgical intervention .  The patient's history has been reviewed, patient examined, no change in status, stable for surgery.  I have reviewed the patient's chart and labs.  Questions were answered to the patient's satisfaction.     [facility]

## 2018-10-12 ENCOUNTER — Encounter: Payer: Self-pay | Admitting: Surgery

## 2018-10-31 ENCOUNTER — Ambulatory Visit (INDEPENDENT_AMBULATORY_CARE_PROVIDER_SITE_OTHER): Payer: Medicare Other | Admitting: Surgery

## 2018-10-31 ENCOUNTER — Other Ambulatory Visit: Payer: Self-pay

## 2018-10-31 ENCOUNTER — Encounter: Payer: Self-pay | Admitting: Surgery

## 2018-10-31 VITALS — BP 144/88 | HR 90 | Temp 98.0°F | Resp 13 | Ht 65.0 in | Wt 132.0 lb

## 2018-10-31 DIAGNOSIS — I2510 Atherosclerotic heart disease of native coronary artery without angina pectoris: Secondary | ICD-10-CM

## 2018-10-31 DIAGNOSIS — K602 Anal fissure, unspecified: Secondary | ICD-10-CM

## 2018-10-31 NOTE — Patient Instructions (Signed)
Use the cream two times daily. Return in three weeks. The patient is aware to call back for any questions or concerns.

## 2018-10-31 NOTE — Progress Notes (Signed)
Outpatient Surgical Follow Up  10/31/2018  Barbara Garcia is an 77 y.o. female.   Chief Complaint  Patient presents with  . Routine Post Op    rectal fissure    HPI: s/p chemical sphincterotomy with Botox.  She is doing well except some intermittent pains.  She does report some constipation.  No more hematochezia.  Overall she feels improved.  She have sharp pains in the anorectal area especially after having a hard bowel movement.  Past Medical History:  Diagnosis Date  . Allergy   . Anxiety   . Arthritis   . Benign essential tremor   . Chronic dermatitis   . Colon polyps   . Combined form of senile cataract   . Constipation   . GERD (gastroesophageal reflux disease)   . Hearing loss   . Hemorrhoids   . History of cystocele   . History of shingles   . Hyperglycemia   . Hyperlipidemia   . Hyperopia with astigmatism and presbyopia   . Hypertension   . Iris nevus   . MVP (mitral valve prolapse)   . Osteopenia   . Osteoporosis   . Posterior vitreous detachment, bilateral   . Retinal drusen, bilateral   . Sjogrens syndrome ([place])   . Vitamin D deficiency     Past Surgical History:  Procedure Laterality Date  . ABDOMINAL HYSTERECTOMY    . BOTOX INJECTION N/A 10/11/2018   Procedure: BOTOX INJECTION;  Surgeon: Jules Husbands, MD;  Location: [facility] ORS;  Service: General;  Laterality: N/A;  . COLON SURGERY     hemmhoirdectomy with abcess fistula  . COLONOSCOPY W/ BIOPSIES  06/18/2005  . ORIF FINGER / THUMB FRACTURE Right    pin removed later  . TONSILLECTOMY      Family History  Problem Relation Age of Onset  . Hypertension Mother   . Cancer Mother   . Stroke Mother   . Heart disease Mother   . Heart failure Mother   . Heart disease Father   . Hyperlipidemia Father   . Hypertension Father   . Heart attack Father   . Arthritis Sister   . Hypertension Sister   . Kidney disease Sister   . Stroke Sister   . Heart Problems Sister   . Heart disease  Sister   . Heart Problems Brother   . Heart Problems Brother   . Heart Problems Brother   . Heart Problems Brother   . Heart Problems Sister   . Hypertension Sister   . Breast cancer Neg Hx     Social History:  reports that she has never smoked. She has never used smokeless tobacco. She reports that she does not drink alcohol or use drugs.  Allergies:  Allergies  Allergen Reactions  . Lisinopril Cough    Medications reviewed.    ROS Full ROS performed and is otherwise negative other than what is stated in HPI   BP (!) 144/88   Pulse 90   Temp 98 F (36.7 C) (Skin)   Resp 13   Ht 5\' 5"  (1.651 m)   Wt 132 lb (59.9 kg)   SpO2 98%   BMI 21.97 kg/m   Physical Exam  Constitutional: She is oriented to person, place, and time. She appears well-developed and well-nourished.  Abdominal: Soft. She exhibits no distension and no mass. There is no tenderness. There is no guarding.  Genitourinary:  Genitourinary Comments: postmidline fissure.  A big improvement from the last time I saw  her.  Some external tags without evidence of any major hemorrhoids.  Masses.  No active bleeding fraction  Musculoskeletal: Normal range of motion.  Neurological: She is alert and oriented to person, place, and time. She displays normal reflexes. No cranial nerve deficit or sensory deficit. She exhibits normal muscle tone. Coordination normal.  Skin: Skin is warm and dry. Capillary refill takes less than 2 seconds.  Psychiatric: She has a normal mood and affect. Her behavior is normal. Judgment and thought content normal.  Nursing note and vitals reviewed.       Assessment/Plan:  1. Anal fissure Responding to Botox injection.  Discussed with her in detail about the importance of controlling constipation and adding high-fiber as well as MiraLAX to her bowel regimen.  Also taught her again how to use the nifedipine cream and that she needed to do a small portion within the rectal canal.  We will  see her back in about 3 to 4 weeks    Greater than 50% of the 15 minutes  visit was spent in counseling/coordination of care   Caroleen Hamman, MD [facility]

## 2018-11-15 ENCOUNTER — Other Ambulatory Visit: Payer: Self-pay | Admitting: Internal Medicine

## 2018-11-15 DIAGNOSIS — E785 Hyperlipidemia, unspecified: Secondary | ICD-10-CM

## 2018-11-15 MED ORDER — ATORVASTATIN CALCIUM 10 MG PO TABS: 10.0000 mg | ORAL_TABLET | Freq: Every day | ORAL | 1 refills | Status: DC

## 2018-11-28 ENCOUNTER — Encounter: Payer: Self-pay | Admitting: Surgery

## 2018-11-28 ENCOUNTER — Other Ambulatory Visit: Payer: Self-pay

## 2018-11-28 ENCOUNTER — Ambulatory Visit (INDEPENDENT_AMBULATORY_CARE_PROVIDER_SITE_OTHER): Payer: Medicare Other | Admitting: Surgery

## 2018-11-28 VITALS — BP 146/79 | HR 94 | Temp 97.2°F | Ht 65.0 in | Wt 131.0 lb

## 2018-11-28 DIAGNOSIS — K602 Anal fissure, unspecified: Secondary | ICD-10-CM | POA: Diagnosis not present

## 2018-11-28 NOTE — Progress Notes (Signed)
Outpatient Surgical Follow Up  11/28/2018  Barbara Garcia is an 78 y.o. female.   Chief Complaint  Patient presents with  . Routine Post Op    f/u s/p anal fissure surgery done 10-11-18    HPI: s/p chemical sphinterotomy. Doing better, Has some intermittent sharp anorectal pain when having a BM. Not complaint w sitz baths and fiber. She is using the nifedipine cream. No fevers no hematochezia  Past Medical History:  Diagnosis Date  . Allergy   . Anxiety   . Arthritis   . Benign essential tremor   . Chronic dermatitis   . Colon polyps   . Combined form of senile cataract   . Constipation   . GERD (gastroesophageal reflux disease)   . Hearing loss   . Hemorrhoids   . History of cystocele   . History of shingles   . Hyperglycemia   . Hyperlipidemia   . Hyperopia with astigmatism and presbyopia   . Hypertension   . Iris nevus   . MVP (mitral valve prolapse)   . Osteopenia   . Osteoporosis   . Posterior vitreous detachment, bilateral   . Retinal drusen, bilateral   . Sjogrens syndrome ([place])   . Vitamin D deficiency     Past Surgical History:  Procedure Laterality Date  . ABDOMINAL HYSTERECTOMY    . BOTOX INJECTION N/A 10/11/2018   Procedure: BOTOX INJECTION;  Surgeon: Jules Husbands, MD;  Location: [facility] ORS;  Service: General;  Laterality: N/A;  . COLON SURGERY     hemmhoirdectomy with abcess fistula  . COLONOSCOPY W/ BIOPSIES  06/18/2005  . ORIF FINGER / THUMB FRACTURE Right    pin removed later  . TONSILLECTOMY      Family History  Problem Relation Age of Onset  . Hypertension Mother   . Cancer Mother   . Stroke Mother   . Heart disease Mother   . Heart failure Mother   . Heart disease Father   . Hyperlipidemia Father   . Hypertension Father   . Heart attack Father   . Arthritis Sister   . Hypertension Sister   . Kidney disease Sister   . Stroke Sister   . Heart Problems Sister   . Heart disease Sister   . Heart Problems Brother   . Heart  Problems Brother   . Heart Problems Brother   . Heart Problems Brother   . Heart Problems Sister   . Hypertension Sister   . Breast cancer Neg Hx     Social History:  reports that she has never smoked. She has never used smokeless tobacco. She reports that she does not drink alcohol or use drugs.  Allergies:  Allergies  Allergen Reactions  . Lisinopril Cough    Medications reviewed.    ROS Full ROS performed and is otherwise negative other than what is stated in HPI   BP (!) 146/79   Pulse 94   Temp (!) 97.2 F (36.2 C) (Temporal)   Ht 5\' 5"  (1.651 m)   Wt 131 lb (59.4 kg)   SpO2 96%   BMI 21.80 kg/m   Physical Exam NAD alert Abd: soft, nt Rect: there is tenderness, I visualize small post fissure. There is increase in tone of the sphincter. No perineal sepsis Ext: no edema and well perfused   Assessment/Plan:  1. Anal fissure Needs aggressive medical rx w daily fiber and BID sitz baths. Slowly improving. We will see her back in 4 weeks  Greater than 50% of the 15* minutes  visit was spent in counseling/coordination of care   Caroleen Hamman, MD [place] [facility]

## 2018-11-28 NOTE — Patient Instructions (Signed)
Patient is to return to the office in 3 to 4 weeks continue to use the anal cream and continue to take sitz baths.  Call the office with any questions or concerns.   How to Take a [employer] A sitz bath is a warm water bath that may be used to care for your rectum, genital area, or the area between your rectum and genitals (perineum). For a sitz bath, the water only comes up to your hips and covers your buttocks. A sitz bath may done at home in a bathtub or with a portable sitz bath that fits over the toilet. Your health care provider may recommend a sitz bath to help:  Relieve pain and discomfort after delivering a baby.  Relieve pain and itching from hemorrhoids or anal fissures.  Relieve pain after certain surgeries.  Relax muscles that are sore or tight. How to take a sitz bath Take 3-4 sitz baths a day, or as many as told by your health care provider. Bathtub sitz bath To take a sitz bath in a bathtub: 1. Partially fill a bathtub with warm water. The water should be deep enough to cover your hips and buttocks when you are sitting in the tub. 2. If your health care provider told you to put medicine in the water, follow his or her instructions. 3. Sit in the water. 4. Open the tub drain a little, and leave it open during your bath. 5. Turn on the warm water again, enough to replace the water that is draining out. Keep the water running throughout your bath. This helps keep the water at the right level and the right temperature. 6. Soak in the water for 15-20 minutes, or as long as told by your health care provider. 7. When you are done, be careful when you stand up. You may feel dizzy. 8. After the sitz bath, pat yourself dry. Do not rub your skin to dry it.  Over-the-toilet sitz bath To take a sitz bath with an over-the-toilet basin: 1. Follow the manufacturer's instructions. 2. Fill the basin with warm water. 3. If your health care provider told you to put medicine in the water,  follow his or her instructions. 4. Sit on the seat. Make sure the water covers your buttocks and perineum. 5. Soak in the water for 15-20 minutes, or as long as told by your health care provider. 6. After the sitz bath, pat yourself dry. Do not rub your skin to dry it. 7. Clean and dry the basin between uses. 8. Discard the basin if it cracks, or according to the manufacturer's instructions. Contact a health care provider if:  Your symptoms get worse. Do not continue with sitz baths if your symptoms get worse.  You have new symptoms. If this happens, do not continue with sitz baths until you talk with your health care provider. Summary  A sitz bath is a warm water bath in which the water only comes up to your hips and covers your buttocks.  A sitz bath may help relieve itching, relieve pain, and relax muscles that are sore or tight in the lower part of your body, including your genital area.  Take 3-4 sitz baths a day, or as many as told by your health care provider. Soak in the water for 15-20 minutes.  Do not continue with sitz baths if your symptoms get worse. This information is not intended to replace advice given to you by your health care provider. Make sure you discuss  any questions you have with your health care provider. Document Released: 08/01/2004 Document Revised: 11/11/2017 Document Reviewed: 11/11/2017 Elsevier Interactive Patient Education  2019 [employer].

## 2018-12-02 ENCOUNTER — Ambulatory Visit (INDEPENDENT_AMBULATORY_CARE_PROVIDER_SITE_OTHER): Payer: Medicare Other | Admitting: Family Medicine

## 2018-12-02 ENCOUNTER — Encounter: Payer: Self-pay | Admitting: Family Medicine

## 2018-12-02 VITALS — BP 128/66 | HR 86 | Temp 98.5°F | Resp 18 | Ht 65.0 in | Wt 131.4 lb

## 2018-12-02 DIAGNOSIS — J040 Acute laryngitis: Secondary | ICD-10-CM

## 2018-12-02 DIAGNOSIS — R490 Dysphonia: Secondary | ICD-10-CM

## 2018-12-02 MED ORDER — METHYLPREDNISOLONE 4 MG PO TBPK: ORAL_TABLET | ORAL | 0 refills | Status: DC

## 2018-12-02 NOTE — Patient Instructions (Signed)
Laryngitis Laryngitis is irritation and swelling (inflammation) of your vocal cords. This condition causes symptoms such as:  A change in your voice. It may sound low and hoarse.  Loss of voice.  Coughing.  Sore throat.  Dry throat.  Stuffy nose. Depending on the cause, this condition may go away after a short time or may last for more than 3 weeks. Treatment often involves resting your voice and using medicines to soothe your throat. Follow these instructions at home: Medicines  Take over-the-counter and prescription medicines only as told by your doctor. General instructions  Talk as little as possible. Also avoid whispering.  Write instead of talking. Do this until your voice is back to normal.  Drink enough fluid to keep your pee (urine) pale yellow.  Breathe in moist air. Use a humidifier if you live in a dry climate.  Do not use any products that have nicotine or tobacco in them, such as cigarettes and e-cigarettes. If you need help quitting, ask your doctor. Contact a doctor if:  You have a fever.  Your pain is worse.  Your symptoms do not get better in 2 weeks. Get help right away if:  You cough up blood.  You have trouble swallowing.  You have trouble breathing. Summary  Laryngitis is inflammation of your vocal cords.  This condition causes your voice to sound low and hoarse.  Rest your voice by talking as little as possible. Also avoid whispering. This information is not intended to replace advice given to you by your health care provider. Make sure you discuss any questions you have with your health care provider. Document Released: 10/29/2011 Document Revised: 10/27/2017 Document Reviewed: 10/27/2017 Elsevier Interactive Patient Education  2019 [employer].

## 2018-12-02 NOTE — Progress Notes (Signed)
Subjective:    Patient ID: Barbara Garcia, female    DOB: November 15, 1941, 78 y.o.   MRN: [id number]  HPI   Patient presents to clinic waning of loss of voice, scratchy throat, nasal and throat congestion for past 2 to 3 days.  Denies any fever or chills.  She has no known sick contacts and states she has not been around any small children recently.  Denies any cough, shortness of breath or wheezing.  Denies chest congestion.  Denies sinus pain or headache.  States she has not been yelling or singing a lot recently.  Patient Active Problem List   Diagnosis Date Noted  . Anal fissure   . Rectal fissure 08/24/2018  . 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  . Coronary artery calcification seen on CAT scan 07/28/2018  . Globus sensation 02/22/2018  . Hyperlipidemia 03/17/2017  . Posterior vitreous detachment, bilateral 09/29/2016  . Retinal drusen, bilateral 09/29/2016  . Chronic constipation 12/07/2013  . Chronic dermatitis 12/07/2013  . Combined form of senile cataract 07/26/2012  . Hyperopia with astigmatism and presbyopia 07/26/2012  . Iris nevus 07/26/2012  . Vitamin D deficiency 02/19/2011  . Benign essential tremor 11/25/2010  . Colon polyps 11/25/2010  . GERD (gastroesophageal reflux disease) 11/25/2010  . MVP (mitral valve prolapse) 11/25/2010  . Mild anxiety 11/25/2010  . Osteopenia 11/25/2010  . Sjogren's syndrome ([place]) 11/25/2010   Social History   Tobacco Use  . Smoking status: Never Smoker  . Smokeless tobacco: Never Used  Substance Use Topics  . Alcohol use: No   Review of Systems   Constitutional: Negative for chills, fatigue and fever.  HENT: +loss of voice, scratchy throat, nasal congestion.   Eyes: Negative.   Respiratory: Negative for cough, shortness of breath and wheezing.   Cardiovascular: Negative for chest pain, palpitations and leg swelling.  Gastrointestinal: Negative for abdominal pain, diarrhea, nausea and vomiting.  Genitourinary: Negative for dysuria,  frequency and urgency.  Musculoskeletal: Negative for arthralgias and myalgias.  Skin: Negative for color change, pallor and rash.  Neurological: Negative for syncope, light-headedness and headaches.  Psychiatric/Behavioral: The patient is not nervous/anxious.       Objective:   Physical Exam Vitals signs and nursing note reviewed.  Constitutional:      General: She is not in acute distress.    Appearance: Normal appearance. She is not toxic-appearing.  HENT:     Head: Normocephalic and atraumatic.     Nose: Congestion and rhinorrhea present.     Mouth/Throat:     Mouth: Mucous membranes are moist.     Pharynx: Oropharynx is clear. No oropharyngeal exudate or posterior oropharyngeal erythema.     Comments: +post nasal drip. +scratchy voice Eyes:     General: No scleral icterus.       Right eye: No discharge.        Left eye: No discharge.     Extraocular Movements: Extraocular movements intact.     Conjunctiva/sclera: Conjunctivae normal.  Neck:     Musculoskeletal: Neck supple. Muscular tenderness present. No neck rigidity.  Cardiovascular:     Rate and Rhythm: Normal rate and regular rhythm.  Pulmonary:     Effort: Pulmonary effort is normal. No respiratory distress.     Breath sounds: Normal breath sounds. No wheezing, rhonchi or rales.  Lymphadenopathy:     Cervical: No cervical adenopathy.  Skin:    Coloration: Skin is not pale.  Neurological:     Mental Status: She is alert and oriented to person,  place, and time.     Gait: Gait normal.  Psychiatric:        Mood and Affect: Mood normal.        Behavior: Behavior normal.    Vitals:   12/02/18 0916  BP: 128/66  Pulse: 86  Resp: 18  Temp: 98.5 F (36.9 C)  SpO2: 97%      Assessment & Plan:   Laryngitis, hoarse voice quality - suspect patient has a viral laryngitis caused by some sort of common cold virus.  No exudates seen in back of throat, and patient has no other strep symptoms so I do not feel swabbing  for strep is necessary. patient advised to rest voice, increase fluid intake, drink warm liquids to soothe throat, take Tylenol or Motrin as needed for pain and do steroid course to help voice to return.  Patient advised to also continue using her Allegra to help improve postnasal drip symptom. patient advised that most often the laryngitis does not require antibiotics to improve, usually sleep will slowly resolve on own and the steroids can help it resolve slightly quicker.  Advised that if voice does not come back slowly over the course of the next 1 to 2 weeks, she can return to clinic for reevaluation.  Otherwise patient will keep regularly scheduled follow-up with PCP as planned she will return to clinic sooner if any issues arise.

## 2019-01-02 ENCOUNTER — Encounter: Payer: Self-pay | Admitting: Surgery

## 2019-01-02 ENCOUNTER — Ambulatory Visit (INDEPENDENT_AMBULATORY_CARE_PROVIDER_SITE_OTHER): Payer: Medicare Other | Admitting: Surgery

## 2019-01-02 ENCOUNTER — Other Ambulatory Visit: Payer: Self-pay

## 2019-01-02 VITALS — BP 144/77 | HR 87 | Temp 97.5°F | Resp 14 | Ht 65.0 in | Wt 132.2 lb

## 2019-01-02 DIAGNOSIS — K602 Anal fissure, unspecified: Secondary | ICD-10-CM | POA: Diagnosis not present

## 2019-01-02 NOTE — Patient Instructions (Signed)
Continue to use your cream twice daily and be sure you are getting some inside of the rectum also.   Follow up as needed. Call if the symptoms get worse.

## 2019-01-04 ENCOUNTER — Encounter: Payer: Self-pay | Admitting: Surgery

## 2019-01-04 NOTE — Progress Notes (Signed)
Outpatient Surgical Follow Up  01/04/2019  Barbara Garcia is an 78 y.o. female.   Chief Complaint  Patient presents with  . Routine Post Op    HPI: f/u anal fissure. S/p botox. Prior hx of fistulotomies and multiple anorectal procedures. She is feeling much better. Some discomfort but she has had this for more than 20 years. No fevers or chills.  Past Medical History:  Diagnosis Date  . Allergy   . Anxiety   . Arthritis   . Benign essential tremor   . Chronic dermatitis   . Colon polyps   . Combined form of senile cataract   . Constipation   . GERD (gastroesophageal reflux disease)   . Hearing loss   . Hemorrhoids   . History of cystocele   . History of shingles   . Hyperglycemia   . Hyperlipidemia   . Hyperopia with astigmatism and presbyopia   . Hypertension   . Iris nevus   . MVP (mitral valve prolapse)   . Osteopenia   . Osteoporosis   . Posterior vitreous detachment, bilateral   . Retinal drusen, bilateral   . Sjogrens syndrome ([place])   . Vitamin D deficiency     Past Surgical History:  Procedure Laterality Date  . ABDOMINAL HYSTERECTOMY    . BOTOX INJECTION N/A 10/11/2018   Procedure: BOTOX INJECTION;  Surgeon: Jules Husbands, MD;  Location: [facility] ORS;  Service: General;  Laterality: N/A;  . COLON SURGERY     hemmhoirdectomy with abcess fistula  . COLONOSCOPY W/ BIOPSIES  06/18/2005  . ORIF FINGER / THUMB FRACTURE Right    pin removed later  . TONSILLECTOMY      Family History  Problem Relation Age of Onset  . Hypertension Mother   . Cancer Mother   . Stroke Mother   . Heart disease Mother   . Heart failure Mother   . Heart disease Father   . Hyperlipidemia Father   . Hypertension Father   . Heart attack Father   . Arthritis Sister   . Hypertension Sister   . Kidney disease Sister   . Stroke Sister   . Heart Problems Sister   . Heart disease Sister   . Heart Problems Brother   . Heart Problems Brother   . Heart Problems Brother    . Heart Problems Brother   . Heart Problems Sister   . Hypertension Sister   . Breast cancer Neg Hx     Social History:  reports that she has never smoked. She has never used smokeless tobacco. She reports that she does not drink alcohol or use drugs.  Allergies:  Allergies  Allergen Reactions  . Lisinopril Cough    Medications reviewed.    ROS Full ROS performed and is otherwise negative other than what is stated in HPI   BP (!) 144/77   Pulse 87   Temp (!) 97.5 F (36.4 C)   Resp 14   Ht 5\' 5"  (1.651 m)   Wt 132 lb 3.2 oz (60 kg)   SpO2 98%   BMI 22.00 kg/m   Physical Exam Vitals signs and nursing note reviewed. Exam conducted with a chaperone present.  Constitutional:      Appearance: Normal appearance. She is normal weight.  Eyes:     General:        Right eye: No discharge.        Left eye: No discharge.  Pulmonary:     Effort: Pulmonary effort  is normal. No respiratory distress.     Breath sounds: No stridor.  Abdominal:     General: Abdomen is flat. There is no distension.     Palpations: Abdomen is soft. There is no mass.     Tenderness: There is no abdominal tenderness.  Genitourinary:    Comments: Right lateral position there is scar and chronic thickening likely from scar tissue related to multiple previous anorectal operation.  Midline fissure posteriorly w significant improvement as compared to last exam. No masses. Rectal tone much improved as well.. no evidence of perianal sepsis Musculoskeletal: Normal range of motion.        General: No swelling or tenderness.  Skin:    General: Skin is warm and dry.     Capillary Refill: Capillary refill takes less than 2 seconds.     Coloration: Skin is not jaundiced.  Neurological:     General: No focal deficit present.     Mental Status: She is alert and oriented to person, place, and time.  Psychiatric:        Mood and Affect: Mood normal.        Behavior: Behavior normal.        Thought Content:  Thought content normal.        Judgment: Judgment normal.         Assessment/Plan:  Improving fissure, continue nifedipine cream , sitz baths. No surgical intervention. Part of her chronic anorectal sxs likely related to prior fistulotomy and scar tissue formation. Greater than 50% of the 15 minutes  visit was spent in counseling/coordination of care   Caroleen Hamman, MD [place] [facility]

## 2019-02-02 DIAGNOSIS — K219 Gastro-esophageal reflux disease without esophagitis: Secondary | ICD-10-CM | POA: Diagnosis not present

## 2019-02-02 DIAGNOSIS — H903 Sensorineural hearing loss, bilateral: Secondary | ICD-10-CM | POA: Diagnosis not present

## 2019-02-02 DIAGNOSIS — R490 Dysphonia: Secondary | ICD-10-CM | POA: Diagnosis not present

## 2019-02-03 ENCOUNTER — Other Ambulatory Visit: Payer: Self-pay | Admitting: Family Medicine

## 2019-02-08 ENCOUNTER — Other Ambulatory Visit: Payer: Self-pay

## 2019-02-08 DIAGNOSIS — E785 Hyperlipidemia, unspecified: Secondary | ICD-10-CM

## 2019-02-08 MED ORDER — ATORVASTATIN CALCIUM 10 MG PO TABS: 10.0000 mg | ORAL_TABLET | Freq: Every day | ORAL | 0 refills | Status: DC

## 2019-03-01 ENCOUNTER — Ambulatory Visit: Payer: Medicare Other | Admitting: Family Medicine

## 2019-03-15 ENCOUNTER — Ambulatory Visit: Payer: Medicare Other | Admitting: Speech Pathology

## 2019-03-27 ENCOUNTER — Ambulatory Visit: Payer: Medicare Other | Admitting: Speech Pathology

## 2019-03-29 ENCOUNTER — Ambulatory Visit: Payer: Medicare Other | Admitting: Speech Pathology

## 2019-04-03 ENCOUNTER — Ambulatory Visit: Payer: Medicare Other | Admitting: Speech Pathology

## 2019-04-05 ENCOUNTER — Ambulatory Visit: Payer: Medicare Other | Admitting: Speech Pathology

## 2019-04-10 ENCOUNTER — Ambulatory Visit: Payer: Medicare Other | Admitting: Speech Pathology

## 2019-04-12 ENCOUNTER — Ambulatory Visit: Payer: Medicare Other | Admitting: Speech Pathology

## 2019-04-19 ENCOUNTER — Ambulatory Visit: Payer: Medicare Other | Admitting: Speech Pathology

## 2019-04-24 ENCOUNTER — Ambulatory Visit: Payer: Medicare Other | Admitting: Speech Pathology

## 2019-04-26 ENCOUNTER — Ambulatory Visit: Payer: Medicare Other | Admitting: Speech Pathology

## 2019-05-01 ENCOUNTER — Ambulatory Visit: Payer: Medicare Other | Admitting: Speech Pathology

## 2019-05-03 ENCOUNTER — Ambulatory Visit: Payer: Medicare Other | Admitting: Speech Pathology

## 2019-05-23 ENCOUNTER — Emergency Department (HOSPITAL_COMMUNITY): Payer: Medicare Other

## 2019-05-23 ENCOUNTER — Encounter (HOSPITAL_COMMUNITY): Payer: Self-pay | Admitting: Emergency Medicine

## 2019-05-23 ENCOUNTER — Telehealth: Payer: Self-pay | Admitting: Cardiovascular Disease

## 2019-05-23 ENCOUNTER — Emergency Department (HOSPITAL_COMMUNITY)
Admission: EM | Admit: 2019-05-23 | Discharge: 2019-05-23 | Disposition: A | Discharge status: Home / Self Care | Payer: Medicare Other | Attending: Emergency Medicine | Admitting: Emergency Medicine

## 2019-05-23 ENCOUNTER — Other Ambulatory Visit: Payer: Self-pay

## 2019-05-23 DIAGNOSIS — R531 Weakness: Secondary | ICD-10-CM | POA: Insufficient documentation

## 2019-05-23 DIAGNOSIS — R0602 Shortness of breath: Secondary | ICD-10-CM

## 2019-05-23 DIAGNOSIS — Z79899 Other long term (current) drug therapy: Secondary | ICD-10-CM | POA: Insufficient documentation

## 2019-05-23 DIAGNOSIS — K219 Gastro-esophageal reflux disease without esophagitis: Secondary | ICD-10-CM | POA: Diagnosis not present

## 2019-05-23 DIAGNOSIS — I10 Essential (primary) hypertension: Secondary | ICD-10-CM | POA: Insufficient documentation

## 2019-05-23 DIAGNOSIS — J189 Pneumonia, unspecified organism: Secondary | ICD-10-CM | POA: Diagnosis not present

## 2019-05-23 DIAGNOSIS — R0989 Other specified symptoms and signs involving the circulatory and respiratory systems: Secondary | ICD-10-CM | POA: Diagnosis present

## 2019-05-23 DIAGNOSIS — R079 Chest pain, unspecified: Secondary | ICD-10-CM | POA: Diagnosis not present

## 2019-05-23 LAB — URINALYSIS, ROUTINE W REFLEX MICROSCOPIC
Bilirubin Urine: NEGATIVE
Glucose, UA: NEGATIVE mg/dL
Hgb urine dipstick: NEGATIVE
Ketones, ur: NEGATIVE mg/dL
Leukocytes,Ua: NEGATIVE
Nitrite: NEGATIVE
Protein, ur: NEGATIVE mg/dL
Specific Gravity, Urine: 1.006 (ref 1.005–1.030)
pH: 8 (ref 5.0–8.0)

## 2019-05-23 LAB — BASIC METABOLIC PANEL
Anion gap: 8 (ref 5–15)
BUN: 14 mg/dL (ref 8–23)
CO2: 27 mmol/L (ref 22–32)
Calcium: 9.3 mg/dL (ref 8.9–10.3)
Chloride: 105 mmol/L (ref 98–111)
Creatinine, Ser: 0.89 mg/dL (ref 0.44–1.00)
GFR calc Af Amer: >60 mL/min (ref >60)
GFR calc non Af Amer: >60 mL/min (ref >60)
Glucose, Bld: 91 mg/dL (ref 70–99)
Potassium: 4.2 mmol/L (ref 3.5–5.1)
Sodium: 140 mmol/L (ref 135–145)

## 2019-05-23 LAB — CBC
HCT: 36.9 % (ref 36.0–46.0)
Hemoglobin: 11.7 g/dL — ABNORMAL LOW (ref 12.0–15.0)
MCH: 28.3 pg (ref 26.0–34.0)
MCHC: 31.7 g/dL (ref 30.0–36.0)
MCV: 89.3 fL (ref 80.0–100.0)
Platelets: 200 10*3/uL (ref 150–400)
RBC: 4.13 MIL/uL (ref 3.87–5.11)
RDW: 13 % (ref 11.5–15.5)
WBC: 4.1 10*3/uL (ref 4.0–10.5)
nRBC: 0 % (ref 0.0–0.2)

## 2019-05-23 LAB — TROPONIN I (HIGH SENSITIVITY)
Troponin I (High Sensitivity): <2 ng/L (ref <18)
Troponin I (High Sensitivity): <2 ng/L (ref <18)

## 2019-05-23 MED ORDER — DOXYCYCLINE HYCLATE 100 MG PO CAPS: 100.0000 mg | ORAL_CAPSULE | Freq: Two times a day (BID) | ORAL | 0 refills | Status: AC

## 2019-05-23 MED ORDER — SODIUM CHLORIDE 0.9% FLUSH: 3.0000 mL | Freq: Once | INTRAVENOUS | Status: DC

## 2019-05-23 NOTE — Telephone Encounter (Signed)
Pt c/o of Chest Pain: STAT if CP now or developed within 24 hours  1. Are you having CP right now? no  2. Are you experiencing any other symptoms (ex. SOB, nausea, vomiting, sweating)? BP was 165/85, SOB, CP last night moving into jaw, HR was 85-105, did not sleep well. Chest felt tight, moved up into neck and into jaw  3. How long have you been experiencing CP? Just last night between 8-9 pm   4. Is your CP continuous or coming and going? Continual for about 30-45 minutes, just felt worn out the rest of the night. Did not have the energy to talk.   5. Have you taken Nitroglycerin? no  Patient states she had ant bites this weekend, and thinks her CP is coming from that. States she took Benadryl and her stomach medication yesterday afternoon around 4 pm. Now her BP 156/75 HR 114 ?

## 2019-05-23 NOTE — ED Triage Notes (Signed)
Pt reports that yesterday afternoon she took some benadryl for ant bites. Later that evening, pt developed central chest pain that radiatied into her neck. Pt felt very restless the rest of the night, but the pain subsided after approx 45 min. Pt reports increased SOB when walking.

## 2019-05-23 NOTE — Telephone Encounter (Signed)
Spoke with patients daughter per release form. She states that her mother has been having chest pain since yesterday with radiation up to her neck and jaw. She reports that over the weekend she was very short of breath and today she is so worn out that she has been unable to really talk to tell us her symptoms. She is very concerned and reviewed blood pressures as well. She reported that she did have some ant bites and took benadryl for that but her chest pain with shortness of breath has her worried. Recommended that she go to ED for further evaluation given her description of symptoms. She verbalized understanding and states that she may take her to [facility] in [place]. Advised that I would make Dr. Rockey Situ aware and to let us know if we can assist with anything. She was appreciative with no further questions.

## 2019-05-23 NOTE — ED Provider Notes (Signed)
[facility] EMERGENCY DEPARTMENT Provider Note   CSN: [id number] Arrival date & time: 05/23/19  1058    History   Chief Complaint No chief complaint on file.   HPI Barbara Garcia is a 78 y.o. female with PMHx GERD, HTN, HLD, Sjogrens syndrome, globus sensation, MVP who presents to the ED today with complaint of a tightness in her throat as well as generalized weakness. Initially while in triage patient reports she was here after experiencing chest pain last night. She reports that after eating dinner she began having central chest pain that radiating up into her neck at midline that lasted a couple of hours with associated shortness of breath. Pt associated it with GERD so she took Omeprazole without relief. She reports that this morning she woke up and felt generally unwell and still had a tightness in her throat but that the chest pain and shortness of breath resolved. No difficulty swallowing, sore throat, drooling, voice change, fever, chills, nausea, vomiting, abdominal pain, urinary complaints, cough. No recent sick contacts. No known exposure to covid 10 positive patient or person under investigation. No hx MI. FHx positive for CAD. Pt is never smoker.        Past Medical History:  Diagnosis Date   Allergy    Anxiety    Arthritis    Benign essential tremor    Chronic dermatitis    Colon polyps    Combined form of senile cataract    Constipation    GERD (gastroesophageal reflux disease)    Hearing loss    Hemorrhoids    History of cystocele    History of shingles    Hyperglycemia    Hyperlipidemia    Hyperopia with astigmatism and presbyopia    Hypertension    Iris nevus    MVP (mitral valve prolapse)    Osteopenia    Osteoporosis    Posterior vitreous detachment, bilateral    Retinal drusen, bilateral    Sjogrens syndrome ([place])    Vitamin D deficiency     Patient Active Problem List   Diagnosis Date Noted    Anal fissure    Rectal fissure 08/24/2018   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   Coronary artery calcification seen on CAT scan 07/28/2018   Globus sensation 02/22/2018   Hyperlipidemia 03/17/2017   Posterior vitreous detachment, bilateral 09/29/2016   Retinal drusen, bilateral 09/29/2016   Chronic constipation 12/07/2013   Chronic dermatitis 12/07/2013   Combined form of senile cataract 07/26/2012   Hyperopia with astigmatism and presbyopia 07/26/2012   Iris nevus 07/26/2012   Vitamin D deficiency 02/19/2011   Benign essential tremor 11/25/2010   Colon polyps 11/25/2010   GERD (gastroesophageal reflux disease) 11/25/2010   MVP (mitral valve prolapse) 11/25/2010   Mild anxiety 11/25/2010   Osteopenia 11/25/2010   Sjogren's syndrome ([place]) 11/25/2010    Past Surgical History:  Procedure Laterality Date   ABDOMINAL HYSTERECTOMY     BOTOX INJECTION N/A 10/11/2018   Procedure: BOTOX INJECTION;  Surgeon: Jules Husbands, MD;  Location: [facility] ORS;  Service: General;  Laterality: N/A;   COLON SURGERY     hemmhoirdectomy with abcess fistula   COLONOSCOPY W/ BIOPSIES  06/18/2005   ORIF FINGER / THUMB FRACTURE Right    pin removed later   TONSILLECTOMY       OB History   No obstetric history on file.      Home Medications    Prior to Admission medications   Medication Sig Start Date End  Date Taking? Authorizing Provider  ALPRAZolam Duanne Moron) 0.5 MG tablet Take 1 tablet (0.5 mg total) by mouth at bedtime as needed for anxiety. 08/24/18  Yes Leone Haven, MD  Magnesium Citrate 200 MG TABS Take 200 mg by mouth daily.   Yes [provider]  Multiple Vitamin (MULTI-VITAMINS) TABS Take 1 tablet by mouth daily.    Yes [provider]  Omega-3 Fatty Acids (FISH OIL) 1000 MG CAPS Take 1,000 mg by mouth daily.    Yes [provider]  pantoprazole (PROTONIX) 40 MG tablet Take 40 mg by mouth daily.   Yes [provider]  Polyvinyl  Alcohol-Povidone PF (REFRESH) 1.4-0.6 % SOLN Place 1 drop into both eyes daily as needed (for dry eyes).   Yes [provider]  propranolol (INDERAL) 10 MG tablet Take 1 tablet (10 mg total) by mouth 3 (three) times daily as needed. Patient taking differently: Take 10 mg by mouth 3 (three) times daily as needed (for shaking).  08/25/18 07/27/19 Yes Gollan, Kathlene November, MD  atorvastatin (LIPITOR) 10 MG tablet Take 1 tablet (10 mg total) by mouth daily. Patient not taking: Reported on 05/23/2019 02/08/19   Leone Haven, MD  doxycycline (VIBRAMYCIN) 100 MG capsule Take 1 capsule (100 mg total) by mouth 2 (two) times daily for 7 days. 05/23/19 05/30/19  Eustaquio Maize, PA-C  escitalopram (LEXAPRO) 5 MG tablet TAKE 1 TABLET BY MOUTH  DAILY Patient not taking: Reported on 05/23/2019 02/06/19   Leone Haven, MD  methylPREDNISolone (MEDROL DOSEPAK) 4 MG TBPK tablet Take according to pack instructions Patient not taking: Reported on 05/23/2019 12/02/18   Jodelle Green, FNP    Family History Family History  Problem Relation Age of Onset   Hypertension Mother    Cancer Mother    Stroke Mother    Heart disease Mother    Heart failure Mother    Heart disease Father    Hyperlipidemia Father    Hypertension Father    Heart attack Father    Arthritis Sister    Hypertension Sister    Kidney disease Sister    Stroke Sister    Heart Problems Sister    Heart disease Sister    Heart Problems Brother    Heart Problems Brother    Heart Problems Brother    Heart Problems Brother    Heart Problems Sister    Hypertension Sister    Breast cancer Neg Hx     Social History Social History   Tobacco Use   Smoking status: Never Smoker   Smokeless tobacco: Never Used  Substance Use Topics   Alcohol use: No   Drug use: No     Allergies   Lisinopril   Review of Systems Review of Systems  Constitutional: Negative for chills and fever.  HENT: Negative for  congestion.   Eyes: Negative for visual disturbance.  Respiratory: Positive for shortness of breath. Negative for cough.   Cardiovascular: Positive for chest pain.  Gastrointestinal: Negative for abdominal pain, blood in stool, constipation, diarrhea, nausea and vomiting.  Genitourinary: Negative for dysuria and frequency.  Musculoskeletal: Positive for neck pain. Negative for myalgias.  Skin: Negative for rash.  Neurological: Negative for headaches.     Physical Exam Updated Vital Signs BP (!) 179/79 (BP Location: Left Arm)    Pulse 86    Temp 98.5 F (36.9 C) (Oral)    Resp 19    Ht 5\' 5"  (2.993 m)    Wt 61.2  kg    SpO2 98%    BMI 22.47 kg/m   Physical Exam Vitals signs and nursing note reviewed.  Constitutional:      Appearance: She is not ill-appearing.  HENT:     Head: Normocephalic and atraumatic.     Mouth/Throat:     Mouth: Mucous membranes are moist.     Pharynx: No oropharyngeal exudate or posterior oropharyngeal erythema.     Comments: Posterior oropharynx without edema, erythema, or exudate. Uvula midline. Airway intact. No trismus. No ludwig's angina.  Eyes:     Conjunctiva/sclera: Conjunctivae normal.  Neck:     Musculoskeletal: Neck supple.  Cardiovascular:     Rate and Rhythm: Normal rate and regular rhythm.     Pulses: Normal pulses.  Pulmonary:     Effort: Pulmonary effort is normal.     Breath sounds: Normal breath sounds. No wheezing, rhonchi or rales.  Abdominal:     Palpations: Abdomen is soft.     Tenderness: There is no abdominal tenderness.  Skin:    General: Skin is warm and dry.  Neurological:     Mental Status: She is alert.     Comments: CN 3-12 grossly intact A&O x4 GCS 15 Sensation and strength intact Coordination with finger-to-nose WNL Neg romberg, neg pronator drift       ED Treatments / Results  Labs (all labs ordered are listed, but only abnormal results are displayed) Labs Reviewed  CBC - Abnormal; Notable for the  following components:      Result Value   Hemoglobin 11.7 (*)    All other components within normal limits  URINALYSIS, ROUTINE W REFLEX MICROSCOPIC - Abnormal; Notable for the following components:   Color, Urine COLORLESS (*)    All other components within normal limits  BASIC METABOLIC PANEL  TROPONIN I (HIGH SENSITIVITY)  TROPONIN I (HIGH SENSITIVITY)    EKG   Radiology Dg Chest 2 View  Result Date: 05/23/2019 CLINICAL DATA:  Chest pain. EXAM: CHEST - 2 VIEW COMPARISON:  No prior. FINDINGS: Mediastinum and hilar structures normal. Heart size normal. No pulmonary venous congestion. Mild right middle lobe atelectasis/infiltrate. Biapical pleural-parenchymal thickening consistent scarring. No pleural effusion or pneumothorax. No acute bony abnormality. IMPRESSION: Mild right middle lobe atelectasis/infiltrate. Biapical pleural-parenchymal thickening consistent scarring. Electronically Signed   By: Marcello Moores  Register   On: 05/23/2019 13:35    Procedures Procedures (including critical care time)  Medications Ordered in ED Medications  sodium chloride flush (NS) 0.9 % injection 3 mL (3 mLs Intravenous Not Given 05/23/19 1403)     Initial Impression / Assessment and Plan / ED Course  I have reviewed the triage vital signs and the nursing notes.  Pertinent labs & imaging results that were available during my care of the patient were reviewed by me and considered in my medical decision making (see chart for details).    78 year old female presenting to the ED with chest pain and shortness of breath last night, currently with generalized weakness and tight sensation to her throat, hx of globus sensation. No difficulty swallowing, voice change, drooling; do not feel patient needs to be worked up for PTA vs retropharyngeal abscess vs epiglottitis today. She is afebrile in the ED and denies a sore throat just a tightness around her neck more so than anything else. Bloodwork obtained while in  triage to rule out cardiac event; feel this is appropriate today given pts complaint of general weakness.   Labwork reassuring today. U/A  without infection. No leukocytosis; hemoglobin stable compared to previous. Initial trop and repeat trop negative < 2. No electrolyte abnormalities.    Hemoglobin  Date Value Ref Range Status  05/23/2019 11.7 (L) 12.0 - 15.0 g/dL Final  08/24/2018 12.3 12.0 - 15.0 g/dL Final  04/22/2017 12.2 12.0 - 15.0 g/dL Final  09/22/2016 11.3 (A) 12.0 - 16.0 g/dL Final   CXR with questionable pneumonia vs infiltrate on right middle lobe; given patient states she has been short of breath will treat with doxycycline for 7 days. Regarding chest pain; this does not sound cardiac in nature; given negative high sens trop more suspicious for GERD; pt states she is supposed to be on omeprazole daily but only takes it occasionally. Advised to take daily to help with her reflux. Pt to follow up with her PCP regarding her ED visit today. She is in agreement with plan at this time and stable for discharge home.       Final Clinical Impressions(s) / ED Diagnoses   Final diagnoses:  Shortness of breath  Community acquired pneumonia of right middle lobe of lung ([place])  Gastroesophageal reflux disease, esophagitis presence not specified    ED Discharge Orders         Ordered    doxycycline (VIBRAMYCIN) 100 MG capsule  2 times daily     05/23/19 1552           Eustaquio Maize, PA-C 05/23/19 Emmons, Kevin, MD 05/24/19 [phone]

## 2019-05-23 NOTE — Discharge Instructions (Signed)
You were seen in the ED today after experiencing chest pain yesterday and being mildly short of breath for the past couple of days; your labwork was reassuring but your CXR did show a pneumonia; please take antibiotics as prescribed for the next week. Please follow up with your PCP regarding your ED visit today.   In terms of your chest pain; please take Omeprazole daily as this was likely GERD related.

## 2019-05-23 NOTE — Telephone Encounter (Signed)
She was seen in the emergency room given antibiotics for pneumonia and restarted on PPI for GERD

## 2019-05-23 NOTE — ED Notes (Signed)
Pt resting without gross distress. Pressure and tightness to neck and jaw starting without radiating.  States she feels she needs a screw driver to release the tension.  No difficulty swallowing or N/ V.  No needs at this time.  Will start IV when repeat troponin is due.

## 2019-05-23 NOTE — ED Notes (Signed)
Pt up to BR without dizziness or pain.

## 2019-05-24 ENCOUNTER — Ambulatory Visit (INDEPENDENT_AMBULATORY_CARE_PROVIDER_SITE_OTHER): Payer: Medicare Other | Admitting: Family Medicine

## 2019-05-24 ENCOUNTER — Telehealth: Payer: Self-pay

## 2019-05-24 DIAGNOSIS — G2581 Restless legs syndrome: Secondary | ICD-10-CM

## 2019-05-24 DIAGNOSIS — J189 Pneumonia, unspecified organism: Secondary | ICD-10-CM

## 2019-05-24 DIAGNOSIS — D649 Anemia, unspecified: Secondary | ICD-10-CM

## 2019-05-24 DIAGNOSIS — W57XXXA Bitten or stung by nonvenomous insect and other nonvenomous arthropods, initial encounter: Secondary | ICD-10-CM | POA: Diagnosis not present

## 2019-05-24 NOTE — Progress Notes (Signed)
Virtual Visit via telephone Note  This visit type was conducted due to national recommendations for restrictions regarding the COVID-19 pandemic (e.g. social distancing).  This format is felt to be most appropriate for this patient at this time.  All issues noted in this document were discussed and addressed.  No physical exam was performed (except for noted visual exam findings with Video Visits).   I connected with Barbara Garcia today at  1:15 PM EDT by telephone and verified that I am speaking with the correct person using two identifiers. Location patient: home Location provider: work  Persons participating in the virtual visit: patient, provider  I discussed the limitations, risks, security and privacy concerns of performing an evaluation and management service by telephone and the availability of in person appointments. I also discussed with the patient that there may be a patient responsible charge related to this service. The patient expressed understanding and agreed to proceed.  Interactive audio and video telecommunications were attempted between this provider and patient, however failed, due to patient having technical difficulties OR patient did not have access to video capability.  We continued and completed visit with audio only.   Reason for visit: ED follow-up  HPI: Pneumonia: Patient was evaluated in the emergency department for throat tightness and generalized weakness with a prior episode of chest pain and shortness of breath.  She notes that she had a mild cough that was nonproductive.  She was a little more short of breath than normal.  No fevers.  Chest pain radiated up into her neck and her jaw.  She wondered if it was a reaction to ant bites that she had on her ankles.  She was evaluated in the emergency department and diagnosed with possible pneumonia.  She was started on doxycycline.  Other evaluation was unremarkable.  She does note some occasional reflux as she is not  taking her Protonix as she should.  Typically takes it as needed.  Restless leg symptoms: Patient notes some nights she feels as though her legs are restless and she just needs to move them.   ROS: See pertinent positives and negatives per HPI.  Past Medical History:  Diagnosis Date  . Allergy   . Anxiety   . Arthritis   . Benign essential tremor   . Chronic dermatitis   . Colon polyps   . Combined form of senile cataract   . Constipation   . GERD (gastroesophageal reflux disease)   . Hearing loss   . Hemorrhoids   . History of cystocele   . History of shingles   . Hyperglycemia   . Hyperlipidemia   . Hyperopia with astigmatism and presbyopia   . Hypertension   . Iris nevus   . MVP (mitral valve prolapse)   . Osteopenia   . Osteoporosis   . Posterior vitreous detachment, bilateral   . Retinal drusen, bilateral   . Sjogrens syndrome ([place])   . Vitamin D deficiency     Past Surgical History:  Procedure Laterality Date  . ABDOMINAL HYSTERECTOMY    . BOTOX INJECTION N/A 10/11/2018   Procedure: BOTOX INJECTION;  Surgeon: Jules Husbands, MD;  Location: [facility] ORS;  Service: General;  Laterality: N/A;  . COLON SURGERY     hemmhoirdectomy with abcess fistula  . COLONOSCOPY W/ BIOPSIES  06/18/2005  . ORIF FINGER / THUMB FRACTURE Right    pin removed later  . TONSILLECTOMY      Family History  Problem Relation Age of Onset  .  Hypertension Mother   . Cancer Mother   . Stroke Mother   . Heart disease Mother   . Heart failure Mother   . Heart disease Father   . Hyperlipidemia Father   . Hypertension Father   . Heart attack Father   . Arthritis Sister   . Hypertension Sister   . Kidney disease Sister   . Stroke Sister   . Heart Problems Sister   . Heart disease Sister   . Heart Problems Brother   . Heart Problems Brother   . Heart Problems Brother   . Heart Problems Brother   . Heart Problems Sister   . Hypertension Sister   . Breast cancer Neg Hx     SOCIAL  HX: Non-smoker   Current Outpatient Medications:  .  ALPRAZolam (XANAX) 0.5 MG tablet, Take 1 tablet (0.5 mg total) by mouth at bedtime as needed for anxiety., Disp: 30 tablet, Rfl: 0 .  atorvastatin (LIPITOR) 10 MG tablet, Take 1 tablet (10 mg total) by mouth daily., Disp: 90 tablet, Rfl: 0 .  doxycycline (VIBRAMYCIN) 100 MG capsule, Take 1 capsule (100 mg total) by mouth 2 (two) times daily for 7 days., Disp: 14 capsule, Rfl: 0 .  escitalopram (LEXAPRO) 5 MG tablet, TAKE 1 TABLET BY MOUTH  DAILY, Disp: 90 tablet, Rfl: 2 .  Magnesium Citrate 200 MG TABS, Take 200 mg by mouth daily., Disp: , Rfl:  .  methylPREDNISolone (MEDROL DOSEPAK) 4 MG TBPK tablet, Take according to pack instructions, Disp: 21 tablet, Rfl: 0 .  Multiple Vitamin (MULTI-VITAMINS) TABS, Take 1 tablet by mouth daily. , Disp: , Rfl:  .  Omega-3 Fatty Acids (FISH OIL) 1000 MG CAPS, Take 1,000 mg by mouth daily. , Disp: , Rfl:  .  pantoprazole (PROTONIX) 40 MG tablet, Take 40 mg by mouth daily., Disp: , Rfl:  .  Polyvinyl Alcohol-Povidone PF (REFRESH) 1.4-0.6 % SOLN, Place 1 drop into both eyes daily as needed (for dry eyes)., Disp: , Rfl:  .  propranolol (INDERAL) 10 MG tablet, Take 1 tablet (10 mg total) by mouth 3 (three) times daily as needed. (Patient taking differently: Take 10 mg by mouth 3 (three) times daily as needed (for shaking). ), Disp: 270 tablet, Rfl: 2 .  triamcinolone cream (KENALOG) 0.1 %, Apply 1 application topically 2 (two) times daily., Disp: 30 g, Rfl: 0  EXAM: No exam was completed as this was a telehealth telephone visit.  ASSESSMENT AND PLAN:  Discussed the following assessment and plan:  Pneumonia Symptoms certainly could be related to pneumonia.  She will complete her course of doxycycline.  We will have her repeat a chest x-ray in about 4 weeks.  If she has recurrent she will be reevaluated.  Bug bite Trial of triamcinolone for these.  Restless legs We will check a ferritin level and then  determine further treatment based on that.  Anemia Mild noted on lab work in the ED.  We will recheck.    I discussed the assessment and treatment plan with the patient. The patient was provided an opportunity to ask questions and all were answered. The patient agreed with the plan and demonstrated an understanding of the instructions.   The patient was advised to call back or seek an in-person evaluation if the symptoms worsen or if the condition fails to improve as anticipated.  I provided 15 minutes of non-face-to-face time during this encounter.   Tommi Rumps, MD

## 2019-05-24 NOTE — Telephone Encounter (Signed)
Copied from [place] [id number]. Topic: General - Other >> May 24, 2019 11:09 AM Keene Breath wrote: Reason for CRM: Patient called to schedule a follow up appt. With doctor after going to the ER yesterday.  After holding on for a while, patient decided to have the office call her back to schedule appt.  Please call patient at [phone]

## 2019-05-28 DIAGNOSIS — W57XXXA Bitten or stung by nonvenomous insect and other nonvenomous arthropods, initial encounter: Secondary | ICD-10-CM | POA: Insufficient documentation

## 2019-05-28 DIAGNOSIS — D649 Anemia, unspecified: Secondary | ICD-10-CM | POA: Insufficient documentation

## 2019-05-28 DIAGNOSIS — J189 Pneumonia, unspecified organism: Secondary | ICD-10-CM | POA: Insufficient documentation

## 2019-05-28 MED ORDER — TRIAMCINOLONE ACETONIDE 0.1 % EX CREA: 1.0000 "application " | TOPICAL_CREAM | Freq: Two times a day (BID) | CUTANEOUS | 0 refills | Status: DC

## 2019-05-28 NOTE — Assessment & Plan Note (Signed)
Symptoms certainly could be related to pneumonia.  She will complete her course of doxycycline.  We will have her repeat a chest x-ray in about 4 weeks.  If she has recurrent she will be reevaluated.

## 2019-05-28 NOTE — Assessment & Plan Note (Signed)
Trial of triamcinolone for these.

## 2019-05-28 NOTE — Assessment & Plan Note (Signed)
We will check a ferritin level and then determine further treatment based on that.

## 2019-05-28 NOTE — Assessment & Plan Note (Signed)
Mild noted on lab work in the ED.  We will recheck.

## 2019-06-17 ENCOUNTER — Encounter: Payer: Self-pay | Admitting: Family Medicine

## 2019-06-20 ENCOUNTER — Telehealth: Payer: Self-pay

## 2019-06-20 ENCOUNTER — Encounter: Payer: Self-pay | Admitting: Family Medicine

## 2019-06-20 NOTE — Telephone Encounter (Signed)
Called and scheduled lab and chest xray with the patient for June 26, 2019.  Nina,cma

## 2019-06-26 ENCOUNTER — Other Ambulatory Visit: Payer: Medicare Other

## 2019-06-26 ENCOUNTER — Other Ambulatory Visit: Payer: Self-pay

## 2019-06-26 ENCOUNTER — Other Ambulatory Visit (INDEPENDENT_AMBULATORY_CARE_PROVIDER_SITE_OTHER): Payer: Medicare Other

## 2019-06-26 ENCOUNTER — Ambulatory Visit (INDEPENDENT_AMBULATORY_CARE_PROVIDER_SITE_OTHER): Payer: Medicare Other

## 2019-06-26 DIAGNOSIS — D649 Anemia, unspecified: Secondary | ICD-10-CM | POA: Diagnosis not present

## 2019-06-26 DIAGNOSIS — J189 Pneumonia, unspecified organism: Secondary | ICD-10-CM | POA: Diagnosis not present

## 2019-06-26 LAB — CBC
HCT: 33.9 % — ABNORMAL LOW (ref 36.0–46.0)
Hemoglobin: 11.2 g/dL — ABNORMAL LOW (ref 12.0–15.0)
MCHC: 33.1 g/dL (ref 30.0–36.0)
MCV: 87.1 fl (ref 78.0–100.0)
Platelets: 201 10*3/uL (ref 150.0–400.0)
RBC: 3.89 Mil/uL (ref 3.87–5.11)
RDW: 13.8 % (ref 11.5–15.5)
WBC: 4.1 10*3/uL (ref 4.0–10.5)

## 2019-06-26 LAB — IBC + FERRITIN
Ferritin: 8.9 ng/mL — ABNORMAL LOW (ref 10.0–291.0)
Iron: 61 ug/dL (ref 42–145)
Saturation Ratios: 13.6 % — ABNORMAL LOW (ref 20.0–50.0)
Transferrin: 320 mg/dL (ref 212.0–360.0)

## 2019-06-27 ENCOUNTER — Encounter: Payer: Self-pay | Admitting: Family Medicine

## 2019-06-28 NOTE — Telephone Encounter (Signed)
I called and spoke to patient and scheduled her for a virtual visit with the provider this Friday, I informed her that I would call her home phone to help her with the doxy, patient understood.  Mayerly Kaman,cma

## 2019-06-29 ENCOUNTER — Other Ambulatory Visit: Payer: Self-pay | Admitting: Family Medicine

## 2019-06-29 DIAGNOSIS — D509 Iron deficiency anemia, unspecified: Secondary | ICD-10-CM

## 2019-06-30 ENCOUNTER — Encounter: Payer: Self-pay | Admitting: Family Medicine

## 2019-06-30 ENCOUNTER — Ambulatory Visit (INDEPENDENT_AMBULATORY_CARE_PROVIDER_SITE_OTHER): Payer: Medicare Other | Admitting: Family Medicine

## 2019-06-30 ENCOUNTER — Other Ambulatory Visit: Payer: Self-pay

## 2019-06-30 VITALS — BP 138/76 | HR 83 | Ht 65.0 in | Wt 132.0 lb

## 2019-06-30 DIAGNOSIS — R079 Chest pain, unspecified: Secondary | ICD-10-CM | POA: Diagnosis not present

## 2019-06-30 DIAGNOSIS — R0609 Other forms of dyspnea: Secondary | ICD-10-CM | POA: Diagnosis not present

## 2019-06-30 DIAGNOSIS — F419 Anxiety disorder, unspecified: Secondary | ICD-10-CM

## 2019-06-30 DIAGNOSIS — D509 Iron deficiency anemia, unspecified: Secondary | ICD-10-CM

## 2019-06-30 NOTE — Progress Notes (Signed)
Virtual Visit via video Note  This visit type was conducted due to national recommendations for restrictions regarding the COVID-19 pandemic (e.g. social distancing).  This format is felt to be most appropriate for this patient at this time.  All issues noted in this document were discussed and addressed.  No physical exam was performed (except for noted visual exam findings with Video Visits).   I connected with Barbara Garcia today at 10:00 AM EDT by a video enabled telemedicine application and verified that I am speaking with the correct person using two identifiers. Location patient: home Location provider: work Persons participating in the virtual visit: patient, provider, husband  I discussed the limitations, risks, security and privacy concerns of performing an evaluation and management service by telephone and the availability of in person appointments. I also discussed with the patient that there may be a patient responsible charge related to this service. The patient expressed understanding and agreed to proceed.   Reason for visit: follow-up  HPI: Chronic cough/chronic dyspnea: Patient reports over the last year or so she has had chronic mild dyspnea that she notices when she walks out to the mailbox and back.  Also notices it with going up stairs.  She does have some chest tightness with this.  She has had rare cough as well.  No wheezing.  No fevers.  No COVID-19 exposure.  She had a chest x-ray recently that revealed possible pneumonia and she was treated for that and follow-up x-ray was negative for pneumonia though did reveal findings consistent with COPD and bronchitis.  No prior PFTs.  She does have a cardiologist.  EKG and enzymes in the ED recently were unremarkable for ischemic cause.  Anemia: Mild though was found to be iron deficient.  No melena, bright red blood per rectum, hematuria, or vaginal bleeding.  Anxiety: Not having too much anxiety.  She remains on Lexapro.   She will occasionally take half a tablet of Xanax to help her sleep.  No drowsiness with this.  No alcohol intake with this.  No depression.   ROS: See pertinent positives and negatives per HPI.  Past Medical History:  Diagnosis Date  . Allergy   . Anxiety   . Arthritis   . Benign essential tremor   . Chronic dermatitis   . Colon polyps   . Combined form of senile cataract   . Constipation   . GERD (gastroesophageal reflux disease)   . Hearing loss   . Hemorrhoids   . History of cystocele   . History of shingles   . Hyperglycemia   . Hyperlipidemia   . Hyperopia with astigmatism and presbyopia   . Hypertension   . Iris nevus   . MVP (mitral valve prolapse)   . Osteopenia   . Osteoporosis   . Posterior vitreous detachment, bilateral   . Retinal drusen, bilateral   . Sjogrens syndrome ([place])   . Vitamin D deficiency     Past Surgical History:  Procedure Laterality Date  . ABDOMINAL HYSTERECTOMY    . BOTOX INJECTION N/A 10/11/2018   Procedure: BOTOX INJECTION;  Surgeon: Jules Husbands, MD;  Location: [facility] ORS;  Service: General;  Laterality: N/A;  . COLON SURGERY     hemmhoirdectomy with abcess fistula  . COLONOSCOPY W/ BIOPSIES  06/18/2005  . ORIF FINGER / THUMB FRACTURE Right    pin removed later  . TONSILLECTOMY      Family History  Problem Relation Age of Onset  . Hypertension  Mother   . Cancer Mother   . Stroke Mother   . Heart disease Mother   . Heart failure Mother   . Heart disease Father   . Hyperlipidemia Father   . Hypertension Father   . Heart attack Father   . Arthritis Sister   . Hypertension Sister   . Kidney disease Sister   . Stroke Sister   . Heart Problems Sister   . Heart disease Sister   . Heart Problems Brother   . Heart Problems Brother   . Heart Problems Brother   . Heart Problems Brother   . Heart Problems Sister   . Hypertension Sister   . Breast cancer Neg Hx     SOCIAL HX: Non-smoker   Current Outpatient Medications:   .  ALPRAZolam (XANAX) 0.5 MG tablet, Take 0.5 tablets (0.25 mg total) by mouth at bedtime as needed for anxiety., Disp: 15 tablet, Rfl: 0 .  atorvastatin (LIPITOR) 10 MG tablet, Take 1 tablet (10 mg total) by mouth daily., Disp: 90 tablet, Rfl: 0 .  escitalopram (LEXAPRO) 5 MG tablet, TAKE 1 TABLET BY MOUTH  DAILY, Disp: 90 tablet, Rfl: 2 .  Magnesium Citrate 200 MG TABS, Take 200 mg by mouth daily., Disp: , Rfl:  .  methylPREDNISolone (MEDROL DOSEPAK) 4 MG TBPK tablet, Take according to pack instructions, Disp: 21 tablet, Rfl: 0 .  Multiple Vitamin (MULTI-VITAMINS) TABS, Take 1 tablet by mouth daily. , Disp: , Rfl:  .  Omega-3 Fatty Acids (FISH OIL) 1000 MG CAPS, Take 1,000 mg by mouth daily. , Disp: , Rfl:  .  pantoprazole (PROTONIX) 40 MG tablet, Take 40 mg by mouth daily., Disp: , Rfl:  .  Polyvinyl Alcohol-Povidone PF (REFRESH) 1.4-0.6 % SOLN, Place 1 drop into both eyes daily as needed (for dry eyes)., Disp: , Rfl:  .  propranolol (INDERAL) 10 MG tablet, Take 1 tablet (10 mg total) by mouth 3 (three) times daily as needed. (Patient taking differently: Take 10 mg by mouth 3 (three) times daily as needed (for shaking). ), Disp: 270 tablet, Rfl: 2 .  triamcinolone cream (KENALOG) 0.1 %, Apply 1 application topically 2 (two) times daily., Disp: 30 g, Rfl: 0 .  albuterol (VENTOLIN HFA) 108 (90 Base) MCG/ACT inhaler, Inhale 2 puffs into the lungs every 6 (six) hours as needed for wheezing or shortness of breath., Disp: 18 g, Rfl: 0  EXAM:  VITALS per patient if applicable: None  GENERAL: alert, oriented, appears well and in no acute distress  HEENT: atraumatic, conjunttiva clear, no obvious abnormalities on inspection of external nose and ears  NECK: normal movements of the head and neck  LUNGS: on inspection no signs of respiratory distress, breathing rate appears normal, no obvious gross SOB, gasping or wheezing  CV: no obvious cyanosis  MS: moves all visible extremities without  noticeable abnormality  PSYCH/NEURO: pleasant and cooperative, no obvious depression or anxiety, speech and thought processing grossly intact  ASSESSMENT AND PLAN:  Discussed the following assessment and plan:  Chest pain on exertion Could be cardiac given history of CAD on imaging.  Less likely related to possible COPD.  Refer to cardiology.  Given reasons to seek medical attention in the emergency room.  Dyspnea on exertion Could be cardiac or COPD related.  Unlikely related to iron deficiency anemia given mild anemia.  Will refer to cardiology.  Will obtain PFTs.  We will provide her with an albuterol inhaler to trial if she has recurrent symptoms.  Given reasons to seek medical attention in the emergency room.  Iron deficiency anemia Undetermined cause.  She will keep her upcoming appointment with GI.  Mild anxiety Asymptomatic currently.  Xanax is helpful for sleep.  She rarely uses this.  Refill provided.  Controlled substance database reviewed.    I discussed the assessment and treatment plan with the patient. The patient was provided an opportunity to ask questions and all were answered. The patient agreed with the plan and demonstrated an understanding of the instructions.   The patient was advised to call back or seek an in-person evaluation if the symptoms worsen or if the condition fails to improve as anticipated.   Tommi Rumps, MD

## 2019-07-01 DIAGNOSIS — R0609 Other forms of dyspnea: Secondary | ICD-10-CM | POA: Insufficient documentation

## 2019-07-01 DIAGNOSIS — R079 Chest pain, unspecified: Secondary | ICD-10-CM | POA: Insufficient documentation

## 2019-07-01 DIAGNOSIS — D509 Iron deficiency anemia, unspecified: Secondary | ICD-10-CM | POA: Insufficient documentation

## 2019-07-01 MED ORDER — ALPRAZOLAM 0.5 MG PO TABS: 0.2500 mg | ORAL_TABLET | Freq: Every evening | ORAL | 0 refills | Status: DC | PRN

## 2019-07-01 MED ORDER — ALBUTEROL SULFATE HFA 108 (90 BASE) MCG/ACT IN AERS: 2.0000 | INHALATION_SPRAY | Freq: Four times a day (QID) | RESPIRATORY_TRACT | 0 refills | Status: DC | PRN

## 2019-07-01 NOTE — Assessment & Plan Note (Signed)
Undetermined cause.  She will keep her upcoming appointment with GI.

## 2019-07-01 NOTE — Assessment & Plan Note (Signed)
Asymptomatic currently.  Xanax is helpful for sleep.  She rarely uses this.  Refill provided.  Controlled substance database reviewed.

## 2019-07-01 NOTE — Assessment & Plan Note (Signed)
Could be cardiac or COPD related.  Unlikely related to iron deficiency anemia given mild anemia.  Will refer to cardiology.  Will obtain PFTs.  We will provide her with an albuterol inhaler to trial if she has recurrent symptoms.  Given reasons to seek medical attention in the emergency room.

## 2019-07-01 NOTE — Addendum Note (Signed)
Addended by: Leone Haven on: 07/01/2019 12:34 PM   Modules accepted: Orders

## 2019-07-01 NOTE — Assessment & Plan Note (Signed)
Could be cardiac given history of CAD on imaging.  Less likely related to possible COPD.  Refer to cardiology.  Given reasons to seek medical attention in the emergency room.

## 2019-07-03 ENCOUNTER — Telehealth: Payer: Self-pay

## 2019-07-03 NOTE — Telephone Encounter (Signed)
Copied from [place] [id number]. Topic: Conservator, museum/gallery Patient (Clinic Use ONLY) >> Jul 03, 2019 10:40 AM Gordy Councilman, CMA wrote: Reason for CRM: to ask if she has seen any blood in urine, stools or any vaginal bleeding. To let her know her referral to GI was placed and they will reach out to her for an appointment.  Nina,cma >> Jul 03, 2019 11:46 AM Leward Quan A wrote: Patient called back for [name] say that there is no blood noted anywhere and she will wait for the call from GI. Also say if there are any questions she can be contacted

## 2019-07-04 NOTE — Telephone Encounter (Signed)
You requested that I call the patient and ask is she has seen any blood in her urine,bowels or the vaginal area, patient states  there is no blood noted anywhere and she will wait for the call from GI. Also say if there are any questions she can be contacted.  Annasophia Crocker,cma

## 2019-07-04 NOTE — Telephone Encounter (Signed)
Noted. Thanks.

## 2019-07-04 NOTE — Telephone Encounter (Signed)
Noted. Thanks for reaching out to her. It looks like GI called her and left a message for her to call back. Please see if she received the call and if not please give her the number for [facility] for her to call to schedule an appointment. Thanks.

## 2019-07-04 NOTE — Telephone Encounter (Signed)
She has an appointment scheduled with [facility] for 07/20/2019.  Nina,cma

## 2019-07-05 ENCOUNTER — Ambulatory Visit: Payer: Medicare Other | Admitting: Gastroenterology

## 2019-07-12 ENCOUNTER — Encounter: Payer: Self-pay | Admitting: Family Medicine

## 2019-07-12 DIAGNOSIS — W57XXXA Bitten or stung by nonvenomous insect and other nonvenomous arthropods, initial encounter: Secondary | ICD-10-CM | POA: Diagnosis not present

## 2019-07-12 DIAGNOSIS — S90861A Insect bite (nonvenomous), right foot, initial encounter: Secondary | ICD-10-CM | POA: Diagnosis not present

## 2019-07-13 NOTE — Telephone Encounter (Signed)
Responded to other message.  

## 2019-07-20 ENCOUNTER — Other Ambulatory Visit: Payer: Self-pay

## 2019-07-20 ENCOUNTER — Ambulatory Visit (INDEPENDENT_AMBULATORY_CARE_PROVIDER_SITE_OTHER): Payer: Medicare Other | Admitting: Gastroenterology

## 2019-07-20 ENCOUNTER — Encounter: Payer: Self-pay | Admitting: Gastroenterology

## 2019-07-20 ENCOUNTER — Ambulatory Visit: Payer: Medicare Other | Admitting: Gastroenterology

## 2019-07-20 VITALS — BP 151/75 | HR 84 | Temp 98.4°F | Resp 17 | Ht 65.0 in | Wt 133.0 lb

## 2019-07-20 DIAGNOSIS — D509 Iron deficiency anemia, unspecified: Secondary | ICD-10-CM | POA: Diagnosis not present

## 2019-07-20 DIAGNOSIS — R5383 Other fatigue: Secondary | ICD-10-CM

## 2019-07-20 DIAGNOSIS — E538 Deficiency of other specified B group vitamins: Secondary | ICD-10-CM

## 2019-07-20 MED ORDER — FUSION PLUS PO CAPS: 1.0000 | ORAL_CAPSULE | Freq: Every day | ORAL | 2 refills | Status: AC

## 2019-07-20 NOTE — Progress Notes (Signed)
Barbara Darby, MD [address]  [place]  [place], [place] [address]  Main: [phone]  Fax: [phone]    Gastroenterology Consultation  Referring Provider:     Leone Haven, MD Primary Care Physician:  Leone Haven, MD Primary Gastroenterologist:  Dr. Cephas Garcia Reason for Consultation:    Iron deficiency anemia, chronic constipation        HPI:   Janit Lattin is a 78 y.o. female referred by Dr. Caryl Bis, Angela Adam, MD  for consultation & management of iron deficiency anemia, chronic constipation  Iron deficiency anemia: Patient reports that she is known to have iron deficiency since she was young which improved after her pregnancies and hysterectomy.  She cannot tolerate oral iron because of severe constipation.  She is currently taking molasses to supplement iron.  Her most recent labs reveal hemoglobin 11.2, ferritin 8.9, normal MCV.  Her platelets, LFTs are normal.  Her ferritin was low at 14 in 2016 she does have history of Sjogren's and reports difficulty swallowing.  She reports having had an upper endoscopy in [place] about 4 to 5 years ago.  She does not know the results.  She denies rectal bleeding, nausea, melena, hematochezia, vaginal bleeding or hematuria.  She does have restless legs which prompted to look for iron deficiency  Chronic constipation: This has been ongoing for several years after her pregnancies which resulted in the perineal tears.  She had a history of anal fissure and had Botox injection by Dr. Perrin Maltese which include the anal spasm.  She reports having hard bowel movements about 2-3 times a week associated with significant abdominal bloating.  She does not like to take MiraLAX.  She tries to incorporate more fiber in her diet.  She thinks she does not have enough fiber in her diet.  She takes laxative as needed.  She denies rectal bleeding, weight loss.  She had a colonoscopy about 4 to 5 years ago in [place], unknown  results  Patient lives with her husband, very active, functionally independent, keeps herself busy at home, does crafts and other stuff  NSAIDs: None  Antiplts/Anticoagulants/Anti thrombotics: None  GI Procedures: Colonoscopy in 2011 FINAL DIAGNOSIS: A.ASCENDING COLON POLYP, BIOPSIES: --SESSILE SERRATED ADENOMA (SSA). --NEGATIVE FOR MALIGNANCY.  Past Medical History:  Diagnosis Date  . Allergy   . Anxiety   . Arthritis   . Benign essential tremor   . Chronic dermatitis   . Colon polyps   . Combined form of senile cataract   . Constipation   . GERD (gastroesophageal reflux disease)   . Hearing loss   . Hemorrhoids   . History of cystocele   . History of shingles   . Hyperglycemia   . Hyperlipidemia   . Hyperopia with astigmatism and presbyopia   . Hypertension   . Iris nevus   . MVP (mitral valve prolapse)   . Osteopenia   . Osteoporosis   . Posterior vitreous detachment, bilateral   . Retinal drusen, bilateral   . Sjogrens syndrome ([place])   . Vitamin D deficiency     Past Surgical History:  Procedure Laterality Date  . ABDOMINAL HYSTERECTOMY    . BOTOX INJECTION N/A 10/11/2018   Procedure: BOTOX INJECTION;  Surgeon: Jules Husbands, MD;  Location: [facility] ORS;  Service: General;  Laterality: N/A;  . COLON SURGERY     hemmhoirdectomy with abcess fistula  . COLONOSCOPY W/ BIOPSIES  06/18/2005  . ORIF FINGER / THUMB FRACTURE Right  pin removed later  . TONSILLECTOMY      Current Outpatient Medications:  .  albuterol (VENTOLIN HFA) 108 (90 Base) MCG/ACT inhaler, Inhale 2 puffs into the lungs every 6 (six) hours as needed for wheezing or shortness of breath., Disp: 18 g, Rfl: 0 .  ALPRAZolam (XANAX) 0.5 MG tablet, Take 0.5 tablets (0.25 mg total) by mouth at bedtime as needed for anxiety., Disp: 15 tablet, Rfl: 0 .  atorvastatin (LIPITOR) 10 MG tablet, Take 1 tablet (10 mg total) by mouth daily., Disp: 90 tablet, Rfl: 0 .   escitalopram (LEXAPRO) 5 MG tablet, TAKE 1 TABLET BY MOUTH  DAILY, Disp: 90 tablet, Rfl: 2 .  loteprednol (LOTEMAX) 0.5 % ophthalmic suspension, nightly as needed, Disp: , Rfl:  .  Magnesium Citrate 200 MG TABS, Take 200 mg by mouth daily., Disp: , Rfl:  .  Multiple Vitamin (MULTI-VITAMINS) TABS, Take 1 tablet by mouth daily. , Disp: , Rfl:  .  Omega-3 Fatty Acids (FISH OIL) 1000 MG CAPS, Take 1,000 mg by mouth daily. , Disp: , Rfl:  .  pantoprazole (PROTONIX) 40 MG tablet, Take 40 mg by mouth daily., Disp: , Rfl:  .  Polyvinyl Alcohol-Povidone PF (REFRESH) 1.4-0.6 % SOLN, Place 1 drop into both eyes daily as needed (for dry eyes)., Disp: , Rfl:  .  propranolol (INDERAL) 10 MG tablet, Take 1 tablet (10 mg total) by mouth 3 (three) times daily as needed. (Patient taking differently: Take 10 mg by mouth 3 (three) times daily as needed (for shaking). ), Disp: 270 tablet, Rfl: 2 .  triamcinolone cream (KENALOG) 0.1 %, Apply 1 application topically 2 (two) times daily., Disp: 30 g, Rfl: 0 .  Cholecalciferol (VITAMIN D3) 25 MCG (1000 UT) CAPS, Take by mouth., Disp: , Rfl:  .  Iron-FA-B Cmp-C-Biot-Probiotic (FUSION PLUS) CAPS, Take 1 capsule by mouth daily., Disp: 30 capsule, Rfl: 2 .  methylPREDNISolone (MEDROL DOSEPAK) 4 MG TBPK tablet, Take according to pack instructions (Patient not taking: Reported on 07/20/2019), Disp: 21 tablet, Rfl: 0   Family History  Problem Relation Age of Onset  . Hypertension Mother   . Cancer Mother   . Stroke Mother   . Heart disease Mother   . Heart failure Mother   . Heart disease Father   . Hyperlipidemia Father   . Hypertension Father   . Heart attack Father   . Arthritis Sister   . Hypertension Sister   . Kidney disease Sister   . Stroke Sister   . Heart Problems Sister   . Heart disease Sister   . Heart Problems Brother   . Heart Problems Brother   . Heart Problems Brother   . Heart Problems Brother   . Heart Problems Sister   . Hypertension Sister    . Breast cancer Neg Hx      Social History   Tobacco Use  . Smoking status: Never Smoker  . Smokeless tobacco: Never Used  Substance Use Topics  . Alcohol use: No  . Drug use: No    Allergies as of 07/20/2019 - Review Complete 07/20/2019  Allergen Reaction Noted  . Lisinopril Cough 05/11/2013    Review of Systems:    All systems reviewed and negative except where noted in HPI.   Physical Exam:  BP (!) 151/75 (BP Location: Left Arm, Patient Position: Sitting, Cuff Size: Normal)   Pulse 84   Temp 98.4 F (36.9 C)   Resp 17   Ht 5\' 5"  (1.651 m)  Wt 133 lb (60.3 kg)   BMI 22.13 kg/m  No LMP recorded. Patient has had a hysterectomy.  General:   Alert,  Well-developed, well-nourished, pleasant and cooperative in NAD Head:  Normocephalic and atraumatic. Eyes:  Sclera clear, no icterus.   Conjunctiva pink. Ears:  Normal auditory acuity. Nose:  No deformity, discharge, or lesions. Mouth:  No deformity or lesions,oropharynx pink & moist. Neck:  Supple; no masses or thyromegaly. Lungs:  Respirations even and unlabored.  Clear throughout to auscultation.   No wheezes, crackles, or rhonchi. No acute distress. Heart:  Regular rate and rhythm; no murmurs, clicks, rubs, or gallops. Abdomen:  Normal bowel sounds. Soft, non-tender and mildly distended, tympanic to percussion without masses, hepatosplenomegaly or hernias noted.  No guarding or rebound tenderness.   Rectal: Not performed Msk:  Symmetrical without gross deformities. Good, equal movement & strength bilaterally. Pulses:  Normal pulses noted. Extremities:  No clubbing or edema.  No cyanosis. Neurologic:  Alert and oriented x3;  grossly normal neurologically. Skin:  Intact without significant lesions or rashes. No jaundice. Psych:  Alert and cooperative. Normal mood and affect.  Imaging Studies: No abdominal imaging  Assessment and Plan:   Jynelle Depaola is a 78 y.o. female with history of Sjogren's, chronic  iron deficiency anemia, chronic constipation, history of anal fissure  Chronic iron deficiency anemia Recommend EGD and colonoscopy for further evaluation Perform esophageal, gastric and duodenal biopsies given her history of Sjogren's Recommend fusion plus and she is willing to try Check B12 and folate levels, TSH and TTG IgA, total IgA Recheck labs in 1 month  Chronic constipation with abdominal bloating Discussed with her about high-fiber diet and fiber supplements Will try stool softeners if high-fiber diet does not help  Follow up in 4 to 6 weeks   Barbara Darby, MD

## 2019-07-20 NOTE — Patient Instructions (Signed)

## 2019-07-21 ENCOUNTER — Telehealth: Payer: Self-pay | Admitting: Gastroenterology

## 2019-07-21 NOTE — Telephone Encounter (Signed)
error 

## 2019-07-22 LAB — TISSUE TRANSGLUTAMINASE, IGA: Transglutaminase IgA: <2 U/mL (ref 0–3)

## 2019-07-22 LAB — B12 AND FOLATE PANEL
Folate: 15.2 ng/mL (ref >3.0)
Vitamin B-12: 307 pg/mL (ref 232–1245)

## 2019-07-22 LAB — IGA: IgA/Immunoglobulin A, Serum: 90 mg/dL (ref 64–422)

## 2019-07-22 LAB — TSH: TSH: 0.625 u[IU]/mL (ref 0.450–4.500)

## 2019-08-01 ENCOUNTER — Other Ambulatory Visit: Payer: Self-pay

## 2019-08-01 ENCOUNTER — Other Ambulatory Visit
Admission: RE | Admit: 2019-08-01 | Discharge: 2019-08-01 | Disposition: A | Discharge status: Home / Self Care | Payer: Medicare Other | Source: Ambulatory Visit | Attending: Gastroenterology | Admitting: Gastroenterology

## 2019-08-01 DIAGNOSIS — Z01812 Encounter for preprocedural laboratory examination: Secondary | ICD-10-CM | POA: Diagnosis not present

## 2019-08-01 DIAGNOSIS — Z20828 Contact with and (suspected) exposure to other viral communicable diseases: Secondary | ICD-10-CM | POA: Diagnosis not present

## 2019-08-01 LAB — SARS CORONAVIRUS 2 (TAT 6-24 HRS): SARS Coronavirus 2: NEGATIVE

## 2019-08-03 ENCOUNTER — Ambulatory Visit: Payer: Medicare Other | Admitting: Certified Registered"

## 2019-08-03 ENCOUNTER — Ambulatory Visit
Admission: RE | Admit: 2019-08-03 | Discharge: 2019-08-03 | Disposition: A | Discharge status: Home / Self Care | Payer: Medicare Other | Attending: Gastroenterology | Admitting: Gastroenterology

## 2019-08-03 ENCOUNTER — Other Ambulatory Visit: Payer: Self-pay

## 2019-08-03 ENCOUNTER — Encounter
Admission: RE | Disposition: A | Discharge status: Home / Self Care | Payer: Self-pay | Source: Home / Self Care | Attending: Gastroenterology

## 2019-08-03 DIAGNOSIS — I2510 Atherosclerotic heart disease of native coronary artery without angina pectoris: Secondary | ICD-10-CM | POA: Insufficient documentation

## 2019-08-03 DIAGNOSIS — K219 Gastro-esophageal reflux disease without esophagitis: Secondary | ICD-10-CM | POA: Diagnosis not present

## 2019-08-03 DIAGNOSIS — F419 Anxiety disorder, unspecified: Secondary | ICD-10-CM | POA: Diagnosis not present

## 2019-08-03 DIAGNOSIS — Z79899 Other long term (current) drug therapy: Secondary | ICD-10-CM | POA: Diagnosis not present

## 2019-08-03 DIAGNOSIS — M3500 Sicca syndrome, unspecified: Secondary | ICD-10-CM | POA: Diagnosis not present

## 2019-08-03 DIAGNOSIS — D509 Iron deficiency anemia, unspecified: Secondary | ICD-10-CM | POA: Diagnosis not present

## 2019-08-03 DIAGNOSIS — I10 Essential (primary) hypertension: Secondary | ICD-10-CM | POA: Insufficient documentation

## 2019-08-03 DIAGNOSIS — E559 Vitamin D deficiency, unspecified: Secondary | ICD-10-CM | POA: Diagnosis not present

## 2019-08-03 DIAGNOSIS — E785 Hyperlipidemia, unspecified: Secondary | ICD-10-CM | POA: Insufficient documentation

## 2019-08-03 DIAGNOSIS — H9190 Unspecified hearing loss, unspecified ear: Secondary | ICD-10-CM | POA: Insufficient documentation

## 2019-08-03 HISTORY — PX: COLONOSCOPY WITH PROPOFOL: SHX5780

## 2019-08-03 HISTORY — PX: ESOPHAGOGASTRODUODENOSCOPY (EGD) WITH PROPOFOL: SHX5813

## 2019-08-03 SURGERY — ESOPHAGOGASTRODUODENOSCOPY (EGD) WITH PROPOFOL
Anesthesia: General

## 2019-08-03 MED ORDER — PROPOFOL 500 MG/50ML IV EMUL
INTRAVENOUS | Status: AC
  Filled 2019-08-03: qty 50

## 2019-08-03 MED ORDER — PROPOFOL 10 MG/ML IV BOLUS
INTRAVENOUS | Status: DC | PRN
  Administered 2019-08-03: 80 mg via INTRAVENOUS
  Administered 2019-08-03: 10 mg via INTRAVENOUS

## 2019-08-03 MED ORDER — PHENYLEPHRINE HCL (PRESSORS) 10 MG/ML IV SOLN
INTRAVENOUS | Status: DC | PRN
  Administered 2019-08-03 (×3): 100 ug via INTRAVENOUS

## 2019-08-03 MED ORDER — LIDOCAINE HCL (CARDIAC) PF 100 MG/5ML IV SOSY
PREFILLED_SYRINGE | INTRAVENOUS | Status: DC | PRN
  Administered 2019-08-03: 40 mg via INTRAVENOUS

## 2019-08-03 MED ORDER — PROPOFOL 500 MG/50ML IV EMUL
INTRAVENOUS | Status: DC | PRN
  Administered 2019-08-03: 150 ug/kg/min via INTRAVENOUS

## 2019-08-03 MED ORDER — GLYCOPYRROLATE 0.2 MG/ML IJ SOLN
INTRAMUSCULAR | Status: DC | PRN
  Administered 2019-08-03: 0.2 mg via INTRAVENOUS

## 2019-08-03 MED ORDER — SODIUM CHLORIDE 0.9 % IV SOLN
INTRAVENOUS | Status: DC
  Administered 2019-08-03: 1000 mL via INTRAVENOUS

## 2019-08-03 NOTE — Op Note (Signed)
[facility] Gastroenterology Patient Name: Barbara Garcia Procedure Date: 08/03/2019 9:52 AM MRN: [id number] Account #: [id number] Date of Birth: January 12, 1941 Admit Type: Outpatient Age: 78 Room: [facility] ENDO ROOM 2 Gender: Female Note Status: Finalized Procedure:            Upper GI endoscopy Indications:          Unexplained iron deficiency anemia Providers:            Lin Landsman MD, MD Referring MD:         Angela Adam. Caryl Bis (Referring MD) Medicines:            Monitored Anesthesia Care Complications:        No immediate complications. Estimated blood loss: None. Procedure:            Pre-Anesthesia Assessment:                       - Prior to the procedure, a History and Physical was                        performed, and patient medications and allergies were                        reviewed. The patient is competent. The risks and                        benefits of the procedure and the sedation options and                        risks were discussed with the patient. All questions                        were answered and informed consent was obtained.                        Patient identification and proposed procedure were                        verified by the physician, the nurse, the                        anesthesiologist, the anesthetist and the technician in                        the pre-procedure area in the procedure room in the                        endoscopy suite. Mental Status Examination: alert and                        oriented. Airway Examination: normal oropharyngeal                        airway and neck mobility. Respiratory Examination:                        clear to auscultation. CV Examination: normal.                        Prophylactic Antibiotics: The patient does not require  prophylactic antibiotics. Prior Anticoagulants: The                        patient has taken no previous anticoagulant or                 antiplatelet agents. ASA Grade Assessment: II - A                        patient with mild systemic disease. After reviewing the                        risks and benefits, the patient was deemed in                        satisfactory condition to undergo the procedure. The                        anesthesia plan was to use monitored anesthesia care                        (MAC). Immediately prior to administration of                        medications, the patient was re-assessed for adequacy                        to receive sedatives. The heart rate, respiratory rate,                        oxygen saturations, blood pressure, adequacy of                        pulmonary ventilation, and response to care were                        monitored throughout the procedure. The physical status                        of the patient was re-assessed after the procedure.                       After obtaining informed consent, the endoscope was                        passed under direct vision. Throughout the procedure,                        the patient's blood pressure, pulse, and oxygen                        saturations were monitored continuously. The Endoscope                        was introduced through the mouth, and advanced to the                        second part of duodenum. The upper GI endoscopy was                        accomplished without difficulty.  The patient tolerated                        the procedure well. Findings:      The duodenal bulb and second portion of the duodenum were normal.       Biopsies for histology were taken with a cold forceps for evaluation of       celiac disease.      The entire examined stomach was normal. Biopsies were taken with a cold       forceps for Helicobacter pylori testing.      The cardia and gastric fundus were normal on retroflexion.      The gastroesophageal junction and examined esophagus were normal.       Biopsies were  taken with a cold forceps for histology.      Esophagogastric landmarks were identified: the gastroesophageal junction       was found at 40 cm from the incisors. Impression:           - Normal duodenal bulb and second portion of the                        duodenum. Biopsied.                       - Normal stomach. Biopsied.                       - Normal gastroesophageal junction and esophagus.                        Biopsied.                       - Esophagogastric landmarks identified. Recommendation:       - Await pathology results.                       - Proceed with colonoscopy as scheduled                       See colonoscopy report Procedure Code(s):    --- Professional ---                       [phone], Esophagogastroduodenoscopy, flexible, transoral;                        with biopsy, single or multiple Diagnosis Code(s):    --- Professional ---                       D50.9, Iron deficiency anemia, unspecified CPT copyright 2019 American Medical Association. All rights reserved. The codes documented in this report are preliminary and upon coder review may  be revised to meet current compliance requirements. Dr. Ulyess Mort Lin Landsman MD, MD 08/03/2019 10:07:19 AM This report has been signed electronically. Number of Addenda: 0 Note Initiated On: 08/03/2019 9:52 AM Estimated Blood Loss: Estimated blood loss: none.      [facility]

## 2019-08-03 NOTE — Anesthesia Postprocedure Evaluation (Signed)
Anesthesia Post Note  Patient: Barbara Garcia  Procedure(s) Performed: ESOPHAGOGASTRODUODENOSCOPY (EGD) WITH PROPOFOL (N/A ) COLONOSCOPY WITH PROPOFOL (N/A )  Patient location during evaluation: PACU Anesthesia Type: General Level of consciousness: awake and alert Pain management: pain level controlled Vital Signs Assessment: post-procedure vital signs reviewed and stable Respiratory status: spontaneous breathing, nonlabored ventilation and respiratory function stable Cardiovascular status: blood pressure returned to baseline and stable Postop Assessment: no apparent nausea or vomiting Anesthetic complications: no     Last Vitals:  Vitals:   08/03/19 1040 08/03/19 1050  BP: 130/75 133/77  Pulse: 92 84  Resp: 18 16  Temp:    SpO2: 100% 99%    Last Pain:  Vitals:   08/03/19 1020  TempSrc: Tympanic  PainSc:                  [name]

## 2019-08-03 NOTE — Anesthesia Procedure Notes (Signed)
Performed by: Roger Kettles, CRNA Pre-anesthesia Checklist: Patient identified, Emergency Drugs available, Suction available and Patient being monitored Patient Re-evaluated:Patient Re-evaluated prior to induction Oxygen Delivery Method: Nasal cannula Induction Type: IV induction Dental Injury: Teeth and Oropharynx as per pre-operative assessment  Comments: Nasal cannula with etCO2 monitoring       

## 2019-08-03 NOTE — Op Note (Signed)
[facility] Gastroenterology Patient Name: Barbara Garcia Procedure Date: 08/03/2019 9:51 AM MRN: [id number] Account #: [id number] Date of Birth: 04/19/1941 Admit Type: Outpatient Age: 78 Room: [facility] ENDO ROOM 2 Gender: Female Note Status: Finalized Procedure:            Colonoscopy Indications:          Unexplained iron deficiency anemia Providers:            Lin Landsman MD, MD Referring MD:         Angela Adam. Caryl Bis (Referring MD) Medicines:            Monitored Anesthesia Care Complications:        No immediate complications. Estimated blood loss: None. Procedure:            Pre-Anesthesia Assessment:                       - Prior to the procedure, a History and Physical was                        performed, and patient medications and allergies were                        reviewed. The patient is competent. The risks and                        benefits of the procedure and the sedation options and                        risks were discussed with the patient. All questions                        were answered and informed consent was obtained.                        Patient identification and proposed procedure were                        verified by the physician, the nurse, the                        anesthesiologist, the anesthetist and the technician in                        the pre-procedure area in the procedure room in the                        endoscopy suite. Mental Status Examination: alert and                        oriented. Airway Examination: normal oropharyngeal                        airway and neck mobility. Respiratory Examination:                        clear to auscultation. CV Examination: normal.                        Prophylactic Antibiotics: The patient does not require  prophylactic antibiotics. Prior Anticoagulants: The                        patient has taken no previous anticoagulant or          antiplatelet agents. ASA Grade Assessment: II - A                        patient with mild systemic disease. After reviewing the                        risks and benefits, the patient was deemed in                        satisfactory condition to undergo the procedure. The                        anesthesia plan was to use monitored anesthesia care                        (MAC). Immediately prior to administration of                        medications, the patient was re-assessed for adequacy                        to receive sedatives. The heart rate, respiratory rate,                        oxygen saturations, blood pressure, adequacy of                        pulmonary ventilation, and response to care were                        monitored throughout the procedure. The physical status                        of the patient was re-assessed after the procedure.                       After obtaining informed consent, the colonoscope was                        passed under direct vision. Throughout the procedure,                        the patient's blood pressure, pulse, and oxygen                        saturations were monitored continuously. The                        Colonoscope was introduced through the anus and                        advanced to the the terminal ileum, with identification                        of the appendiceal orifice and IC valve. The  colonoscopy was performed without difficulty. The                        patient tolerated the procedure well. The quality of                        the bowel preparation was evaluated using the BBPS                        [facility] Bowel Preparation Scale) with scores of: Right                        Colon = 3, Transverse Colon = 3 and Left Colon = 3                        (entire mucosa seen well with no residual staining,                        small fragments of stool or opaque liquid). The total                         BBPS score equals 9. Findings:      The perianal and digital rectal examinations were normal. Pertinent       negatives include normal sphincter tone and no palpable rectal lesions.      The terminal ileum appeared normal.      The colon (entire examined portion) appeared normal.      The retroflexed view of the distal rectum and anal verge was normal and       showed no anal or rectal abnormalities. Impression:           - The examined portion of the ileum was normal.                       - The entire examined colon is normal.                       - The distal rectum and anal verge are normal on                        retroflexion view.                       - No specimens collected. Recommendation:       - Discharge patient to home (with escort).                       - Resume regular diet today.                       - Continue present medications.                       - Restart oral iron                       - If IDA persistent after 59month, recommend VCE Procedure Code(s):    --- Professional ---                       [phone] Colonoscopy, flexible; diagnostic, including  collection of specimen(s) by brushing or washing, when                        performed (separate procedure) Diagnosis Code(s):    --- Professional ---                       D50.9, Iron deficiency anemia, unspecified CPT copyright 2019 American Medical Association. All rights reserved. The codes documented in this report are preliminary and upon coder review may  be revised to meet current compliance requirements. Dr. Ulyess Mort Lin Landsman MD, MD 08/03/2019 10:22:34 AM This report has been signed electronically. Number of Addenda: 0 Note Initiated On: 08/03/2019 9:51 AM Scope Withdrawal Time: 0 hours 6 minutes 25 seconds  Total Procedure Duration: 0 hours 11 minutes 7 seconds  Estimated Blood Loss: Estimated blood loss: none.      [facility]

## 2019-08-03 NOTE — H&P (Signed)
Cephas Darby, MD [address]  [place]  [place], [place] [address]  Main: [phone]  Fax: [phone] Pager: [phone]  Primary Care Physician:  Leone Haven, MD Primary Gastroenterologist:  Dr. Cephas Darby  Pre-Procedure History & Physical: HPI:  Barbara Garcia is a 78 y.o. female is here for an endoscopy and colonoscopy.   Past Medical History:  Diagnosis Date  . Allergy   . Anxiety   . Arthritis   . Benign essential tremor   . Chronic dermatitis   . Colon polyps   . Combined form of senile cataract   . Constipation   . GERD (gastroesophageal reflux disease)   . Hearing loss   . Hemorrhoids   . History of cystocele   . History of shingles   . Hyperglycemia   . Hyperlipidemia   . Hyperopia with astigmatism and presbyopia   . Hypertension   . Iris nevus   . MVP (mitral valve prolapse)   . Osteopenia   . Osteoporosis   . Posterior vitreous detachment, bilateral   . Retinal drusen, bilateral   . Sjogrens syndrome ([place])   . Vitamin D deficiency     Past Surgical History:  Procedure Laterality Date  . ABDOMINAL HYSTERECTOMY    . BOTOX INJECTION N/A 10/11/2018   Procedure: BOTOX INJECTION;  Surgeon: Jules Husbands, MD;  Location: [facility] ORS;  Service: General;  Laterality: N/A;  . COLON SURGERY     hemmhoirdectomy with abcess fistula  . COLONOSCOPY W/ BIOPSIES  06/18/2005  . ORIF FINGER / THUMB FRACTURE Right    pin removed later  . TONSILLECTOMY      Prior to Admission medications   Medication Sig Start Date End Date Taking? Authorizing Provider  albuterol (VENTOLIN HFA) 108 (90 Base) MCG/ACT inhaler Inhale 2 puffs into the lungs every 6 (six) hours as needed for wheezing or shortness of breath. 07/01/19   Leone Haven, MD  ALPRAZolam Duanne Moron) 0.5 MG tablet Take 0.5 tablets (0.25 mg total) by mouth at bedtime as needed for anxiety. 07/01/19   Leone Haven, MD  atorvastatin (LIPITOR) 10 MG tablet Take 1 tablet (10 mg  total) by mouth daily. 02/08/19   Leone Haven, MD  Cholecalciferol (VITAMIN D3) 25 MCG (1000 UT) CAPS Take by mouth.    [provider]  escitalopram (LEXAPRO) 5 MG tablet TAKE 1 TABLET BY MOUTH  DAILY 02/06/19   Leone Haven, MD  Iron-FA-B Cmp-C-Biot-Probiotic (FUSION PLUS) CAPS Take 1 capsule by mouth daily. 07/20/19 08/19/19  Lin Landsman, MD  loteprednol (LOTEMAX) 0.5 % ophthalmic suspension nightly as needed    [provider]  Magnesium Citrate 200 MG TABS Take 200 mg by mouth daily.    [provider]  methylPREDNISolone (MEDROL DOSEPAK) 4 MG TBPK tablet Take according to pack instructions Patient not taking: Reported on 07/20/2019 12/02/18   Jodelle Green, FNP  Multiple Vitamin (MULTI-VITAMINS) TABS Take 1 tablet by mouth daily.     [provider]  Omega-3 Fatty Acids (FISH OIL) 1000 MG CAPS Take 1,000 mg by mouth daily.     [provider]  pantoprazole (PROTONIX) 40 MG tablet Take 40 mg by mouth daily.    [provider]  Polyvinyl Alcohol-Povidone PF (REFRESH) 1.4-0.6 % SOLN Place 1 drop into both eyes daily as needed (for dry eyes).    [provider]  propranolol (INDERAL) 10 MG tablet Take 1 tablet (10 mg total) by mouth  3 (three) times daily as needed. Patient taking differently: Take 10 mg by mouth 3 (three) times daily as needed (for shaking).  08/25/18 07/27/19  Minna Merritts, MD  triamcinolone cream (KENALOG) 0.1 % Apply 1 application topically 2 (two) times daily. 05/28/19   Leone Haven, MD    Allergies as of 07/20/2019 - Review Complete 07/20/2019  Allergen Reaction Noted  . Lisinopril Cough 05/11/2013    Family History  Problem Relation Age of Onset  . Hypertension Mother   . Cancer Mother   . Stroke Mother   . Heart disease Mother   . Heart failure Mother   . Heart disease Father   . Hyperlipidemia Father   . Hypertension Father   . Heart attack Father   . Arthritis Sister   .  Hypertension Sister   . Kidney disease Sister   . Stroke Sister   . Heart Problems Sister   . Heart disease Sister   . Heart Problems Brother   . Heart Problems Brother   . Heart Problems Brother   . Heart Problems Brother   . Heart Problems Sister   . Hypertension Sister   . Breast cancer Neg Hx     Social History   Socioeconomic History  . Marital status: Married    Spouse name: Not on file  . Number of children: Not on file  . Years of education: Not on file  . Highest education level: Not on file  Occupational History  . Not on file  Social Needs  . Financial resource strain: Not hard at all  . Food insecurity    Worry: Never true    Inability: Never true  . Transportation needs    Medical: No    Non-medical: No  Tobacco Use  . Smoking status: Never Smoker  . Smokeless tobacco: Never Used  Substance and Sexual Activity  . Alcohol use: No  . Drug use: No  . Sexual activity: Never    Partners: Male  Lifestyle  . Physical activity    Days per week: 0 days    Minutes per session: Not on file  . Stress: Not on file  Relationships  . Social Herbalist on phone: Not on file    Gets together: Not on file    Attends religious service: Not on file    Active member of club or organization: Not on file    Attends meetings of clubs or organizations: Not on file    Relationship status: Not on file  . Intimate partner violence    Fear of current or ex partner: No    Emotionally abused: No    Physically abused: No    Forced sexual activity: No  Other Topics Concern  . Not on file  Social History Narrative  . Not on file    Review of Systems: See HPI, otherwise negative ROS  Physical Exam: BP (!) 150/80   Pulse 78   Temp (!) 97 F (36.1 C) (Tympanic)   Resp 20   Ht 5\' 5"  (1.651 m)   Wt 59.9 kg   SpO2 100%   BMI 21.97 kg/m  General:   Alert,  pleasant and cooperative in NAD Head:  Normocephalic and atraumatic. Neck:  Supple; no masses or  thyromegaly. Lungs:  Clear throughout to auscultation.    Heart:  Regular rate and rhythm. Abdomen:  Soft, nontender and nondistended. Normal bowel sounds, without guarding, and without rebound.   Neurologic:  Alert and  oriented x4;  grossly normal neurologically.  Impression/Plan: Barbara Garcia is here for an endoscopy and colonoscopy to be performed for chronic IDA  Risks, benefits, limitations, and alternatives regarding  endoscopy and colonoscopy have been reviewed with the patient.  Questions have been answered.  All parties agreeable.   Sherri Sear, MD  08/03/2019, 9:47 AM

## 2019-08-03 NOTE — Transfer of Care (Signed)
Immediate Anesthesia Transfer of Care Note  Patient: Barbara Garcia  Procedure(s) Performed: Procedure(s): ESOPHAGOGASTRODUODENOSCOPY (EGD) WITH PROPOFOL (N/A) COLONOSCOPY WITH PROPOFOL (N/A)  Patient Location: PACU and Endoscopy Unit  Anesthesia Type:General  Level of Consciousness: sedated  Airway & Oxygen Therapy: Patient Spontanous Breathing and Patient connected to nasal cannula oxygen  Post-op Assessment: Report given to RN and Post -op Vital signs reviewed and stable  Post vital signs: Reviewed and stable  Last Vitals:  Vitals:   08/03/19 1020 08/03/19 1028  BP: (!) 93/49 (!) 93/49  Pulse: 87 88  Resp: 16 12  Temp: (!) 36.1 C   SpO2: 123XX123 [id number]    Complications: No apparent anesthesia complications

## 2019-08-03 NOTE — Anesthesia Post-op Follow-up Note (Signed)
Anesthesia QCDR form completed.        

## 2019-08-03 NOTE — Anesthesia Preprocedure Evaluation (Signed)
Anesthesia Evaluation  Patient identified by MRN, date of birth, ID band Patient awake    Reviewed: Allergy & Precautions, H&P , NPO status , Patient's Chart, lab work & pertinent test results  Airway Mallampati: II  TM Distance: >3 FB Neck ROM: full    Dental  (+) Teeth Intact   Pulmonary neg pulmonary ROS,           Cardiovascular hypertension, (-) angina+ CAD  (-) Past MI (-) dysrhythmias      Neuro/Psych Anxiety negative neurological ROS     GI/Hepatic Neg liver ROS, GERD  Controlled,  Endo/Other  negative endocrine ROS  Renal/GU negative Renal ROS  negative genitourinary   Musculoskeletal   Abdominal   Peds  Hematology negative hematology ROS (+)   Anesthesia Other Findings Past Medical History: No date: Allergy No date: Anxiety No date: Arthritis No date: Benign essential tremor No date: Chronic dermatitis No date: Colon polyps No date: Combined form of senile cataract No date: Constipation No date: GERD (gastroesophageal reflux disease) No date: Hearing loss No date: Hemorrhoids No date: History of cystocele No date: History of shingles No date: Hyperglycemia No date: Hyperlipidemia No date: Hyperopia with astigmatism and presbyopia No date: Hypertension No date: Iris nevus No date: MVP (mitral valve prolapse) No date: Osteopenia No date: Osteoporosis No date: Posterior vitreous detachment, bilateral 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, bilateral No date: Sjogrens syndrome ([place]) No date: Vitamin D deficiency  Past Surgical History: No date: ABDOMINAL HYSTERECTOMY 10/11/2018: BOTOX INJECTION; N/A     Comment:  Procedure: BOTOX INJECTION;  Surgeon: Jules Husbands,               MD;  Location: [facility] ORS;  Service: General;  Laterality:               N/A; No date: COLON SURGERY     Comment:  hemmhoirdectomy with abcess fistula 06/18/2005: COLONOSCOPY W/ BIOPSIES No date: ORIF FINGER / THUMB FRACTURE;  Right     Comment:  pin removed later No date: TONSILLECTOMY  BMI    Body Mass Index: 21.97 kg/m      Reproductive/Obstetrics negative OB ROS                             Anesthesia Physical Anesthesia Plan  ASA: II  Anesthesia Plan: General   Post-op Pain Management:    Induction:   PONV Risk Score and Plan: Propofol infusion and TIVA  Airway Management Planned: Natural Airway and Nasal Cannula  Additional Equipment:   Intra-op Plan:   Post-operative Plan:   Informed Consent: I have reviewed the patients History and Physical, chart, labs and discussed the procedure including the risks, benefits and alternatives for the proposed anesthesia with the patient or authorized representative who has indicated his/her understanding and acceptance.     Dental Advisory Given  Plan Discussed with: Anesthesiologist and CRNA  Anesthesia Plan Comments:         Anesthesia Quick Evaluation

## 2019-08-04 ENCOUNTER — Encounter: Payer: Self-pay | Admitting: Gastroenterology

## 2019-08-04 LAB — SURGICAL PATHOLOGY

## 2019-08-06 NOTE — Progress Notes (Signed)
Cardiology Office Note  Date:  08/07/2019   ID:  Barbara, Garcia 1941-05-24, MRN [id number]  PCP:  Leone Haven, MD   Chief Complaint  Patient presents with  . Other     per E Sonnenberg for dyspnea on exertion and chest pain on exertion. Patient states her chest x-ray showed possible COPD. Meds reviewed verbally with patient.     HPI:  [name] is a 78 year old woman with past medical history of GERD anxiety Mitral valve prolapse Previous chest pain Coronary calcification on CT scan Who presents to establish care for her coronary artery disease  Significant stress at home with her husband who has medical issues Husband with anger issue He is on SSRI, continues to have problems He is very sedentary does not walk at all, does not participate in activities with her  Recent episode, recently watching TV, developed chest pain to jaw At rest, Daughter made her go to the hospital, [facility], told she had PNA, but had no sx TNT negative x2 repeat xray with "COPD"  Sob on hill walking recovers well Never has chest pain shortness of breath walking on flat surface  Now with albuterol, has used it twice Not sure if it is helping  No diabetes No smoking  BP elevated: long walk into office today Typically blood pressure well controlled at home  She declined EKG today  Other past medical hx In 07/2015 Prior stress test, carotid ultrasound, abdominal aortic ultrasound , CT coronary calcium score No significant carotid disease as detailed below, normal stress test, aorta was normal Very low calcium score 39  In 2015 She has been maintained on low-dose Lipitor When she takes the medication on a regular basis total cholesterol usually relatively well controlled Most recently total cholesterol 160 It has been as low as 140 Baseline cholesterol without any medication is typically 230s  Testing detailed below Prior stress test September 2016 NSR at  rest.  7:35 Bruce; 117% MPHR.  No exertional chest pain. Normal BP response.  1-1.85mm upsloping ST depression laterally at peak, transient/brief; normal in early recovery.  Symptomatically negative, electrically equivocal ECG treadmill test.  Carotid ultrasound Normal July 2015  CT coronary calcium score 39 in the LAD February 2015  ultrasound aorta normal study abdominal 1/ 2015  Stress test 2011 Carotid ultrasound 2010, no stenosis   PMH:   has a past medical history of Allergy, Anxiety, Arthritis, Benign essential tremor, Chronic dermatitis, Colon polyps, Combined form of senile cataract, Constipation, GERD (gastroesophageal reflux disease), Hearing loss, Hemorrhoids, History of cystocele, History of shingles, Hyperglycemia, Hyperlipidemia, Hyperopia with astigmatism and presbyopia, Hypertension, Iris nevus, MVP (mitral valve prolapse), Osteopenia, Osteoporosis, Posterior vitreous detachment, bilateral, Retinal drusen, bilateral, Sjogrens syndrome ([place]), and Vitamin D deficiency.  PSH:    Past Surgical History:  Procedure Laterality Date  . ABDOMINAL HYSTERECTOMY    . BOTOX INJECTION N/A 10/11/2018   Procedure: BOTOX INJECTION;  Surgeon: Jules Husbands, MD;  Location: [facility] ORS;  Service: General;  Laterality: N/A;  . COLON SURGERY     hemmhoirdectomy with abcess fistula  . COLONOSCOPY W/ BIOPSIES  06/18/2005  . COLONOSCOPY WITH PROPOFOL N/A 08/03/2019   Procedure: COLONOSCOPY WITH PROPOFOL;  Surgeon: Lin Landsman, MD;  Location[facility]COPY;  Service: Gastroenterology;  Laterality: N/A;  . ESOPHAGOGASTRODUODENOSCOPY (EGD) WITH PROPOFOL N/A 08/03/2019   Procedure: ESOPHAGOGASTRODUODENOSCOPY (EGD) WITH PROPOFOL;  Surgeon: Lin Landsman, MD;  Location: [facility] ENDOSCOPY;  Service: Gastroenterology;  Laterality: N/A;  .  ORIF FINGER / THUMB FRACTURE Right    pin removed later  . TONSILLECTOMY      Current Outpatient Medications  Medication Sig Dispense Refill  .  albuterol (VENTOLIN HFA) 108 (90 Base) MCG/ACT inhaler Inhale 2 puffs into the lungs every 6 (six) hours as needed for wheezing or shortness of breath. 18 g 0  . ALPRAZolam (XANAX) 0.5 MG tablet Take 0.5 tablets (0.25 mg total) by mouth at bedtime as needed for anxiety. 15 tablet 0  . atorvastatin (LIPITOR) 10 MG tablet Take 1 tablet (10 mg total) by mouth daily. 90 tablet 0  . Cholecalciferol (VITAMIN D3) 25 MCG (1000 UT) CAPS Take by mouth.    . escitalopram (LEXAPRO) 5 MG tablet TAKE 1 TABLET BY MOUTH  DAILY 90 tablet 2  . Iron-FA-B Cmp-C-Biot-Probiotic (FUSION PLUS) CAPS Take 1 capsule by mouth daily. 30 capsule 2  . loteprednol (LOTEMAX) 0.5 % ophthalmic suspension nightly as needed    . Magnesium Citrate 200 MG TABS Take 200 mg by mouth daily.    . Multiple Vitamin (MULTI-VITAMINS) TABS Take 1 tablet by mouth daily.     . Omega-3 Fatty Acids (FISH OIL) 1000 MG CAPS Take 1,000 mg by mouth daily.     . pantoprazole (PROTONIX) 40 MG tablet Take 40 mg by mouth daily.    . Polyvinyl Alcohol-Povidone PF (REFRESH) 1.4-0.6 % SOLN Place 1 drop into both eyes daily as needed (for dry eyes).    . triamcinolone cream (KENALOG) 0.1 % Apply 1 application topically 2 (two) times daily. 30 g 0  . propranolol (INDERAL) 10 MG tablet Take 1 tablet (10 mg total) by mouth 3 (three) times daily as needed. (Patient taking differently: Take 10 mg by mouth 3 (three) times daily as needed (for shaking). ) 270 tablet 2   No current facility-administered medications for this visit.      Allergies:   Lisinopril   Social History:  The patient  reports that she has never smoked. She has never used smokeless tobacco. She reports that she does not drink alcohol or use drugs.   Family History:   family history includes Arthritis in her sister; Cancer in her mother; Heart Problems in her brother, brother, brother, brother, sister, and sister; Heart attack in her father; Heart disease in her father, mother, and sister;  Heart failure in her mother; Hyperlipidemia in her father; Hypertension in her father, mother, sister, and sister; Kidney disease in her sister; Stroke in her mother and sister.    Review of Systems: Review of Systems  Constitutional: Negative.   HENT: Negative.   Respiratory: Negative.   Cardiovascular: Positive for chest pain.  Gastrointestinal: Negative.   Musculoskeletal: Negative.   Neurological: Negative.   Psychiatric/Behavioral: Negative.   All other systems reviewed and are negative.   PHYSICAL EXAM: VS:  BP (!) 156/82 (BP Location: Left Arm, Patient Position: Sitting, Cuff Size: Normal)   Pulse 80   Ht 5\' 5"  (1.651 m)   Wt 134 lb (60.8 kg)   SpO2 98%   BMI 22.30 kg/m  , BMI Body mass index is 22.3 kg/m. GEN: Well nourished, well developed, in no acute distress  HEENT: normal  Neck: no JVD, carotid bruits, or masses Cardiac: RRR; no murmurs, rubs, or gallops,no edema  Respiratory:  clear to auscultation bilaterally, normal work of breathing GI: soft, nontender, nondistended, + BS MS: no deformity or atrophy  Skin: warm and dry, no rash Neuro:  Strength and sensation are intact Psych:  euthymic mood, full affect  Recent Labs: 08/24/2018: ALT 9 05/23/2019: BUN 14; Creatinine, Ser 0.89; Potassium 4.2; Sodium 140 06/26/2019: Hemoglobin 11.2; Platelets 201.0 07/20/2019: TSH 0.625    Lipid Panel Lab Results  Component Value Date   CHOL 187 08/24/2018   HDL 66.00 08/24/2018   LDLCALC 92 08/24/2018   TRIG 146.0 08/24/2018      Wt Readings from Last 3 Encounters:  08/07/19 134 lb (60.8 kg)  08/03/19 132 lb (59.9 kg)  07/20/19 133 lb (60.3 kg)       ASSESSMENT AND PLAN:  Chest pain Atypical, presenting at rest not with exertion Discussed we could do testing, she has declined at this time Prefers to wait until her lung studies done next month  Shortness of breath Only on hills not on flat surface even with heavy exertion flat Lung studies pending If  normal potentially could order echocardiogram She prefers to not over order the testing  Coronary artery calcification seen on CAT scan - Plan: EKG 12-Lead Minimal coronary calcification noted 5 years ago Plan as above  Mixed hyperlipidemia Continue Lipitor Number stable  Hypertension goal BP (blood pressure) < 130/80 - Plan: EKG 12-Lead Elevated today after walking in the office but well controlled at home No changes made  MVP (mitral valve prolapse) No significant murmur appreciated on exam No further testing needed  Disposition:   F/U 1 year Instructions given to call us if shortness of breath or chest pain symptoms get worse We would order echo, possibly stress test for more chest pain symptoms   Total encounter time more than 25 minutes  Greater than 50% was spent in counseling and coordination of care with the patient Home   No orders of the defined types were placed in this encounter.    Signed, Esmond Plants, M.D., Ph.D. 08/07/2019  Alex, Roland

## 2019-08-07 ENCOUNTER — Other Ambulatory Visit: Payer: Self-pay

## 2019-08-07 ENCOUNTER — Ambulatory Visit (INDEPENDENT_AMBULATORY_CARE_PROVIDER_SITE_OTHER): Payer: Medicare Other | Admitting: Cardiovascular Disease

## 2019-08-07 ENCOUNTER — Encounter: Payer: Self-pay | Admitting: Cardiovascular Disease

## 2019-08-07 VITALS — BP 156/82 | HR 80 | Ht 65.0 in | Wt 134.0 lb

## 2019-08-07 DIAGNOSIS — I341 Nonrheumatic mitral (valve) prolapse: Secondary | ICD-10-CM

## 2019-08-07 DIAGNOSIS — I2510 Atherosclerotic heart disease of native coronary artery without angina pectoris: Secondary | ICD-10-CM | POA: Diagnosis not present

## 2019-08-07 DIAGNOSIS — I10 Essential (primary) hypertension: Secondary | ICD-10-CM

## 2019-08-07 DIAGNOSIS — E782 Mixed hyperlipidemia: Secondary | ICD-10-CM | POA: Diagnosis not present

## 2019-08-07 NOTE — Patient Instructions (Signed)

## 2019-08-21 ENCOUNTER — Ambulatory Visit: Payer: Medicare Other | Admitting: Cardiovascular Disease

## 2019-08-28 ENCOUNTER — Encounter: Payer: Self-pay | Admitting: Gastroenterology

## 2019-08-28 ENCOUNTER — Other Ambulatory Visit: Payer: Self-pay

## 2019-08-28 ENCOUNTER — Ambulatory Visit (INDEPENDENT_AMBULATORY_CARE_PROVIDER_SITE_OTHER): Payer: Medicare Other | Admitting: Gastroenterology

## 2019-08-28 VITALS — BP 146/72 | HR 97 | Temp 98.1°F | Wt 133.4 lb

## 2019-08-28 DIAGNOSIS — D508 Other iron deficiency anemias: Secondary | ICD-10-CM

## 2019-08-28 MED ORDER — FUSION PLUS PO CAPS: 1.0000 | ORAL_CAPSULE | Freq: Every day | ORAL | 1 refills | Status: AC

## 2019-08-28 NOTE — Progress Notes (Signed)
Cephas Darby, MD [address]  [place]  [place], [place] [address]  Main: [phone]  Fax: [phone]    Gastroenterology Consultation  Referring Provider:     Leone Haven, MD Primary Care Physician:  Leone Haven, MD Primary Gastroenterologist:  Dr. Cephas Darby Reason for Consultation:    Iron deficiency anemia, chronic constipation        HPI:   Barbara Garcia is a 78 y.o. female referred by Dr. Caryl Bis, Angela Adam, MD  for consultation & management of iron deficiency anemia, chronic constipation  Iron deficiency anemia: Patient reports that she is known to have iron deficiency since she was young which improved after her pregnancies and hysterectomy.  She cannot tolerate oral iron because of severe constipation.  She is currently taking molasses to supplement iron.  Her most recent labs reveal hemoglobin 11.2, ferritin 8.9, normal MCV.  Her platelets, LFTs are normal.  Her ferritin was low at 14 in 2016 she does have history of Sjogren's and reports difficulty swallowing.  She reports having had an upper endoscopy in [place] about 4 to 5 years ago.  She does not know the results.  She denies rectal bleeding, nausea, melena, hematochezia, vaginal bleeding or hematuria.  She does have restless legs which prompted to look for iron deficiency  Chronic constipation: This has been ongoing for several years after her pregnancies which resulted in the perineal tears.  She had a history of anal fissure and had Botox injection by Dr. Perrin Maltese which include the anal spasm.  She reports having hard bowel movements about 2-3 times a week associated with significant abdominal bloating.  She does not like to take MiraLAX.  She tries to incorporate more fiber in her diet.  She thinks she does not have enough fiber in her diet.  She takes laxative as needed.  She denies rectal bleeding, weight loss.  She had a colonoscopy about 4 to 5 years ago in [place], unknown  results  Patient lives with her husband, very active, functionally independent, keeps herself busy at home, does crafts and other stuff  Follow-up visit in 520 Patient underwent EGD and colonoscopy which were unremarkable.  She is taking fusion plus intermittently.  She does not have any symptoms today.  NSAIDs: None  Antiplts/Anticoagulants/Anti thrombotics: None  GI Procedures: Colonoscopy in 2011 FINAL DIAGNOSIS: A.ASCENDING COLON POLYP, BIOPSIES: --SESSILE SERRATED ADENOMA (SSA). --NEGATIVE FOR MALIGNANCY.  - Normal duodenal bulb and second portion of the duodenum. Biopsied. - Normal stomach. Biopsied. - Normal gastroesophageal junction and esophagus. Biopsied. - Esophagogastric landmarks identified.  - The examined portion of the ileum was normal. - The entire examined colon is normal. - The distal rectum and anal verge are normal on retroflexion view. - No specimens collected.  DIAGNOSIS:  A. DUODENUM, RANDOM; COLD BIOPSY:  - ENTERIC MUCOSA WITH PRESERVED VILLOUS ARCHITECTURE AND NO SIGNIFICANT  HISTOPATHOLOGIC CHANGE.  - NEGATIVE FOR FEATURES OF CELIAC, DYSPLASIA, AND MALIGNANCY.   B. STOMACH, RANDOM; COLD BIOPSY:  - GASTRIC ANTRAL AND OXYNTIC MUCOSA WITH NO SIGNIFICANT HISTOPATHOLOGIC  CHANGE.  - NEGATIVE FOR H. PYLORI, DYSPLASIA, AND MALIGNANCY.   C. ESOPHAGUS, RANDOM; COLD BIOPSY:  - BENIGN SQUAMOUS MUCOSA WITH NO SIGNIFICANT HISTOPATHOLOGIC CHANGE.  - NO INCREASE IN INTRAEPITHELIAL EOSINOPHILS (LESS THAN 2 PER HPF).  - NEGATIVE FOR DYSPLASIA AND MALIGNANCY.   Past Medical History:  Diagnosis Date   Allergy    Anxiety    Arthritis    Benign essential tremor  Chronic dermatitis    Colon polyps    Combined form of senile cataract    Constipation    GERD (gastroesophageal reflux disease)    Hearing loss    Hemorrhoids    History of cystocele    History of shingles    Hyperglycemia     Hyperlipidemia    Hyperopia with astigmatism and presbyopia    Hypertension    Iris nevus    MVP (mitral valve prolapse)    Osteopenia    Osteoporosis    Posterior vitreous detachment, bilateral    Retinal drusen, bilateral    Sjogrens syndrome (HCC)    Vitamin D deficiency     Past Surgical History:  Procedure Laterality Date   ABDOMINAL HYSTERECTOMY     BOTOX INJECTION N/A 10/11/2018   Procedure: BOTOX INJECTION;  Surgeon: Jules Husbands, MD;  Location: [facility];  Service: General;  Laterality: N/A;   COLON SURGERY     hemmhoirdectomy with abcess fistula   COLONOSCOPY W/ BIOPSIES  06/18/2005   COLONOSCOPY WITH PROPOFOL N/A 08/03/2019   Procedure: COLONOSCOPY WITH PROPOFOL;  Surgeon: Lin Landsman, MD;  Location: [facility];  Service: Gastroenterology;  Laterality: N/A;   ESOPHAGOGASTRODUODENOSCOPY (EGD) WITH PROPOFOL N/A 08/03/2019   Procedure: ESOPHAGOGASTRODUODENOSCOPY (EGD) WITH PROPOFOL;  Surgeon: Lin Landsman, MD;  Location: [facility];  Service: Gastroenterology;  Laterality: N/A;   ORIF FINGER / THUMB FRACTURE Right    pin removed later   TONSILLECTOMY      Current Outpatient Medications:    albuterol (VENTOLIN HFA) 108 (90 Base) MCG/ACT inhaler, Inhale 2 puffs into the lungs every 6 (six) hours as needed for wheezing or shortness of breath., Disp: 18 g, Rfl: 0   ALPRAZolam (XANAX) 0.5 MG tablet, Take 0.5 tablets (0.25 mg total) by mouth at bedtime as needed for anxiety., Disp: 15 tablet, Rfl: 0   escitalopram (LEXAPRO) 5 MG tablet, TAKE 1 TABLET BY MOUTH  DAILY, Disp: 90 tablet, Rfl: 2   loteprednol (LOTEMAX) 0.5 % ophthalmic suspension, nightly as needed, Disp: , Rfl:    Magnesium Citrate 200 MG TABS, Take 200 mg by mouth daily., Disp: , Rfl:    Multiple Vitamin (MULTI-VITAMINS) TABS, Take 1 tablet by mouth daily. , Disp: , Rfl:    pantoprazole (PROTONIX) 40 MG tablet, Take 40 mg by mouth daily., Disp: , Rfl:    Polyvinyl  Alcohol-Povidone PF (REFRESH) 1.4-0.6 % SOLN, Place 1 drop into both eyes daily as needed (for dry eyes)., Disp: , Rfl:    propranolol (INDERAL) 10 MG tablet, Take 1 tablet (10 mg total) by mouth 3 (three) times daily as needed. (Patient taking differently: Take 10 mg by mouth 3 (three) times daily as needed (for shaking). ), Disp: 270 tablet, Rfl: 2   triamcinolone cream (KENALOG) 0.1 %, Apply 1 application topically 2 (two) times daily., Disp: 30 g, Rfl: 0   Family History  Problem Relation Age of Onset   Hypertension Mother    Cancer Mother    Stroke Mother    Heart disease Mother    Heart failure Mother    Heart disease Father    Hyperlipidemia Father    Hypertension Father    Heart attack Father    Arthritis Sister    Hypertension Sister    Kidney disease Sister    Stroke Sister    Heart Problems Sister    Heart disease Sister    Heart Problems Brother    Heart Problems Brother  Heart Problems Brother    Heart Problems Brother    Heart Problems Sister    Hypertension Sister    Breast cancer Neg Hx      Social History   Tobacco Use   Smoking status: Never Smoker   Smokeless tobacco: Never Used  Substance Use Topics   Alcohol use: No   Drug use: No    Allergies as of 08/28/2019 - Review Complete 08/28/2019  Allergen Reaction Noted   Lisinopril Cough 05/11/2013    Review of Systems:    All systems reviewed and negative except where noted in HPI.   Physical Exam:  BP (!) 146/72 (BP Location: Left Arm, Patient Position: Sitting, Cuff Size: Normal)    Pulse 97    Temp 98.1 F (36.7 C) (Oral)    Wt 133 lb 6 oz (60.5 kg)    BMI 22.19 kg/m  No LMP recorded. Patient has had a hysterectomy.  General:   Alert,  Well-developed, well-nourished, pleasant and cooperative in NAD Head:  Normocephalic and atraumatic. Eyes:  Sclera clear, no icterus.   Conjunctiva pink. Ears:  Normal auditory acuity. Nose:  No deformity, discharge, or  lesions. Mouth:  No deformity or lesions,oropharynx pink & moist. Neck:  Supple; no masses or thyromegaly. Lungs:  Respirations even and unlabored.  Clear throughout to auscultation.   No wheezes, crackles, or rhonchi. No acute distress. Heart:  Regular rate and rhythm; no murmurs, clicks, rubs, or gallops. Abdomen:  Normal bowel sounds. Soft, non-tender and mildly distended, tympanic to percussion without masses, hepatosplenomegaly or hernias noted.  No guarding or rebound tenderness.   Rectal: Not performed Msk:  Symmetrical without gross deformities. Good, equal movement & strength bilaterally. Pulses:  Normal pulses noted. Extremities:  No clubbing or edema.  No cyanosis. Neurologic:  Alert and oriented x3;  grossly normal neurologically. Skin:  Intact without significant lesions or rashes. No jaundice. Psych:  Alert and cooperative. Normal mood and affect.  Imaging Studies: No abdominal imaging  Assessment and Plan:   Barbara Garcia is a 78 y.o. female with history of Sjogren's, chronic iron deficiency anemia, chronic constipation, history of anal fissure  Chronic iron deficiency anemia Unremarkable EGD and colonoscopy Advised her to take fusion plus regularly Recommend to recheck CBC and iron studies by Dr. Caryl Bis in 1 to 2 months If persistently iron deficient, patient is willing to undergo video capsule endoscopy  Chronic constipation with abdominal bloating Discussed with her about high-fiber diet and fiber supplements Will try stool softeners if high-fiber diet does not help  Follow up in 3 months   Cephas Darby, MD

## 2019-09-01 DIAGNOSIS — Z23 Encounter for immunization: Secondary | ICD-10-CM | POA: Diagnosis not present

## 2019-09-12 ENCOUNTER — Other Ambulatory Visit: Payer: Self-pay

## 2019-09-12 ENCOUNTER — Ambulatory Visit: Payer: Medicare Other | Attending: Family Medicine

## 2019-09-12 DIAGNOSIS — R0600 Dyspnea, unspecified: Secondary | ICD-10-CM

## 2019-09-12 DIAGNOSIS — R0609 Other forms of dyspnea: Secondary | ICD-10-CM | POA: Insufficient documentation

## 2019-09-12 MED ORDER — ALBUTEROL SULFATE (2.5 MG/3ML) 0.083% IN NEBU
2.5000 mg | INHALATION_SOLUTION | Freq: Once | RESPIRATORY_TRACT | Status: AC
  Administered 2019-09-12: 2.5 mg via RESPIRATORY_TRACT
  Filled 2019-09-12: qty 3

## 2019-09-20 ENCOUNTER — Telehealth: Payer: Self-pay | Admitting: Family Medicine

## 2019-09-20 DIAGNOSIS — R0609 Other forms of dyspnea: Secondary | ICD-10-CM

## 2019-09-20 NOTE — Telephone Encounter (Signed)
Patient called and was read not of Dr Caryl Bis 09/16/2019. She verbalized understanding and will wait to be set up for pulmonologist per Dr Caryl Bis note.

## 2019-09-20 NOTE — Telephone Encounter (Signed)
Patient called and was read not of Dr Caryl Bis 09/16/2019. She verbalized understanding and will wait to be set up for pulmonologist per Dr Caryl Bis note.  Celina Shiley,cma

## 2019-09-21 NOTE — Telephone Encounter (Signed)
Referral placed.

## 2019-09-21 NOTE — Addendum Note (Signed)
Addended by: Caryl Bis, Manus Weedman G on: 09/21/2019 10:09 AM   Modules accepted: Orders

## 2019-10-03 ENCOUNTER — Ambulatory Visit (INDEPENDENT_AMBULATORY_CARE_PROVIDER_SITE_OTHER): Payer: Medicare Other | Admitting: Family Medicine

## 2019-10-03 ENCOUNTER — Other Ambulatory Visit: Payer: Self-pay

## 2019-10-03 ENCOUNTER — Ambulatory Visit (INDEPENDENT_AMBULATORY_CARE_PROVIDER_SITE_OTHER): Payer: Medicare Other

## 2019-10-03 VITALS — BP 140/80 | HR 77 | Temp 97.6°F | Ht 65.0 in | Wt 134.0 lb

## 2019-10-03 DIAGNOSIS — Z0000 Encounter for general adult medical examination without abnormal findings: Secondary | ICD-10-CM

## 2019-10-03 DIAGNOSIS — I2510 Atherosclerotic heart disease of native coronary artery without angina pectoris: Secondary | ICD-10-CM | POA: Diagnosis not present

## 2019-10-03 DIAGNOSIS — K5909 Other constipation: Secondary | ICD-10-CM | POA: Diagnosis not present

## 2019-10-03 DIAGNOSIS — L280 Lichen simplex chronicus: Secondary | ICD-10-CM | POA: Diagnosis not present

## 2019-10-03 DIAGNOSIS — R0600 Dyspnea, unspecified: Secondary | ICD-10-CM | POA: Diagnosis not present

## 2019-10-03 DIAGNOSIS — E782 Mixed hyperlipidemia: Secondary | ICD-10-CM

## 2019-10-03 DIAGNOSIS — R0609 Other forms of dyspnea: Secondary | ICD-10-CM

## 2019-10-03 DIAGNOSIS — D509 Iron deficiency anemia, unspecified: Secondary | ICD-10-CM | POA: Diagnosis not present

## 2019-10-03 LAB — CBC
HCT: 36.6 % (ref 36.0–46.0)
Hemoglobin: 12.4 g/dL (ref 12.0–15.0)
MCHC: 33.7 g/dL (ref 30.0–36.0)
MCV: 88.3 fl (ref 78.0–100.0)
Platelets: 204 10*3/uL (ref 150.0–400.0)
RBC: 4.15 Mil/uL (ref 3.87–5.11)
RDW: 14.8 % (ref 11.5–15.5)
WBC: 2.9 10*3/uL — ABNORMAL LOW (ref 4.0–10.5)

## 2019-10-03 LAB — COMPREHENSIVE METABOLIC PANEL
ALT: 19 U/L (ref 0–35)
AST: 23 U/L (ref 0–37)
Albumin: 4.3 g/dL (ref 3.5–5.2)
Alkaline Phosphatase: 67 U/L (ref 39–117)
BUN: 19 mg/dL (ref 6–23)
CO2: 28 mEq/L (ref 19–32)
Calcium: 9.1 mg/dL (ref 8.4–10.5)
Chloride: 103 mEq/L (ref 96–112)
Creatinine, Ser: 0.82 mg/dL (ref 0.40–1.20)
GFR: 67.2 mL/min (ref >60.00)
Glucose, Bld: 100 mg/dL — ABNORMAL HIGH (ref 70–99)
Potassium: 4.4 mEq/L (ref 3.5–5.1)
Sodium: 136 mEq/L (ref 135–145)
Total Bilirubin: 0.4 mg/dL (ref 0.2–1.2)
Total Protein: 7.7 g/dL (ref 6.0–8.3)

## 2019-10-03 LAB — IBC + FERRITIN
Ferritin: 21.1 ng/mL (ref 10.0–291.0)
Iron: 105 ug/dL (ref 42–145)
Saturation Ratios: 24.4 % (ref 20.0–50.0)
Transferrin: 307 mg/dL (ref 212.0–360.0)

## 2019-10-03 LAB — LIPID PANEL
Cholesterol: 219 mg/dL — ABNORMAL HIGH (ref 0–200)
HDL: 71.7 mg/dL (ref >39.00)
LDL Cholesterol: 134 mg/dL — ABNORMAL HIGH (ref 0–99)
NonHDL: 146.94
Total CHOL/HDL Ratio: 3
Triglycerides: 67 mg/dL (ref 0.0–149.0)
VLDL: 13.4 mg/dL (ref 0.0–40.0)

## 2019-10-03 LAB — MAGNESIUM: Magnesium: 2.4 mg/dL (ref 1.5–2.5)

## 2019-10-03 MED ORDER — TETANUS-DIPHTH-ACELL PERTUSSIS 5-2.5-18.5 LF-MCG/0.5 IM SUSP: 0.5000 mL | Freq: Once | INTRAMUSCULAR | 0 refills | Status: AC

## 2019-10-03 MED ORDER — ALPRAZOLAM 0.5 MG PO TABS: 0.2500 mg | ORAL_TABLET | Freq: Every evening | ORAL | 0 refills | Status: DC | PRN

## 2019-10-03 NOTE — Progress Notes (Signed)
Subjective:   Barbara Garcia is a 78 y.o. female who presents for Medicare Annual (Subsequent) preventive examination.  Review of Systems:  No ROS.  Medicare Wellness Virtual Visit.  Visual/audio telehealth visit, UTA vital signs.   See social history for additional risk factors.   Cardiac Risk Factors include: advanced age (>9men, >33 women)     Objective:     Vitals: There were no vitals taken for this visit.  There is no height or weight on file to calculate BMI.  Advanced Directives 10/03/2019 08/03/2019 05/23/2019 05/23/2019 10/11/2018 09/30/2018 09/28/2018  Does Patient Have a Medical Advance Directive? Yes Yes Yes No Yes Yes Yes  Type of Paramedic of [place];Living will Living will Living will - [place];Living will Living will [place];Living will  Does patient want to make changes to medical advance directive? No - Patient declined - No - Patient declined - - No - Patient declined No - Patient declined  Copy of [place] in Chart? No - copy requested - - - No - copy requested No - copy requested No - copy requested  Would patient like information on creating a medical advance directive? - - No - Patient declined - - - -    Tobacco Social History   Tobacco Use  Smoking Status Never Smoker  Smokeless Tobacco Never Used     Counseling given: Not Answered   Clinical Intake:  Pre-visit preparation completed: Yes        Diabetes: No  How often do you need to have someone help you when you read instructions, pamphlets, or other written materials from your doctor or pharmacy?: 1 - Never  Interpreter Needed?: No     Past Medical History:  Diagnosis Date  . Allergy   . Anxiety   . Arthritis   . Benign essential tremor   . Chronic dermatitis   . Colon polyps   . Combined form of senile cataract   . Constipation   . GERD (gastroesophageal reflux disease)   . Hearing  loss   . Hemorrhoids   . History of cystocele   . History of shingles   . Hyperglycemia   . Hyperlipidemia   . Hyperopia with astigmatism and presbyopia   . Hypertension   . Iris nevus   . MVP (mitral valve prolapse)   . Osteopenia   . Osteoporosis   . Posterior vitreous detachment, bilateral   . Retinal drusen, bilateral   . Sjogrens syndrome ([place])   . Vitamin D deficiency    Past Surgical History:  Procedure Laterality Date  . ABDOMINAL HYSTERECTOMY    . BOTOX INJECTION N/A 10/11/2018   Procedure: BOTOX INJECTION;  Surgeon: Jules Husbands, MD;  Location: [facility];  Service: General;  Laterality: N/A;  . COLON SURGERY     hemmhoirdectomy with abcess fistula  . COLONOSCOPY W/ BIOPSIES  06/18/2005  . COLONOSCOPY WITH PROPOFOL N/A 08/03/2019   Procedure: COLONOSCOPY WITH PROPOFOL;  Surgeon: Lin Landsman, MD;  [facility] END[facility];  Service: Gastroenterology;  Laterality: N/A;  . ESOPHAGOGASTRODUODENOSCOPY (EGD) WITH PROPOFOL N/A 08/03/2019   Procedure: ESOPHAGOGASTRODUODENOSCOPY (EGD) WITH PROPOFOL;  Surgeon: Lin Landsman, MD;  Location: [facility] END[facility]COPY;  Service: Gastroenterology;  Laterality: N/A;  . ORIF FINGER / THUMB FRACTURE Right    pin removed later  . TONSILLECTOMY     Family History  Problem Relation Age of Onset  . Hypertension Mother   . Cancer  Mother   . Stroke Mother   . Heart disease Mother   . Heart failure Mother   . Heart disease Father   . Hyperlipidemia Father   . Hypertension Father   . Heart attack Father   . Arthritis Sister   . Hypertension Sister   . Kidney disease Sister   . Stroke Sister   . Heart Problems Sister   . Heart disease Sister   . Heart Problems Brother   . Heart Problems Brother   . Heart Problems Brother   . Heart Problems Brother   . Heart Problems Sister   . Hypertension Sister   . Breast cancer Neg Hx    Social History   Socioeconomic History  . Marital status: Married    Spouse name: Not on file  .  Number of children: Not on file  . Years of education: Not on file  . Highest education level: Not on file  Occupational History  . Not on file  Social Needs  . Financial resource strain: Not hard at all  . Food insecurity    Worry: Never true    Inability: Never true  . Transportation needs    Medical: No    Non-medical: No  Tobacco Use  . Smoking status: Never Smoker  . Smokeless tobacco: Never Used  Substance and Sexual Activity  . Alcohol use: No  . Drug use: No  . Sexual activity: Never    Partners: Male  Lifestyle  . Physical activity    Days per week: 0 days    Minutes per session: Not on file  . Stress: Not on file  Relationships  . Social Herbalist on phone: Not on file    Gets together: Not on file    Attends religious service: Not on file    Active member of club or organization: Not on file    Attends meetings of clubs or organizations: Not on file    Relationship status: Not on file  Other Topics Concern  . Not on file  Social History Narrative  . Not on file    Outpatient Encounter Medications as of 10/03/2019  Medication Sig  . albuterol (VENTOLIN HFA) 108 (90 Base) MCG/ACT inhaler Inhale 2 puffs into the lungs every 6 (six) hours as needed for wheezing or shortness of breath.  . ALPRAZolam (XANAX) 0.5 MG tablet Take 0.5 tablets (0.25 mg total) by mouth at bedtime as needed for anxiety.  Marland Kitchen escitalopram (LEXAPRO) 5 MG tablet TAKE 1 TABLET BY MOUTH  DAILY  . Magnesium Citrate 200 MG TABS Take 200 mg by mouth daily.  . Multiple Vitamin (MULTI-VITAMINS) TABS Take 1 tablet by mouth daily.   . pantoprazole (PROTONIX) 40 MG tablet Take 40 mg by mouth daily.  . Polyvinyl Alcohol-Povidone PF (REFRESH) 1.4-0.6 % SOLN Place 1 drop into both eyes daily as needed (for dry eyes).  . triamcinolone cream (KENALOG) 0.1 % Apply 1 application topically 2 (two) times daily.  . propranolol (INDERAL) 10 MG tablet Take 1 tablet (10 mg total) by mouth 3 (three)  times daily as needed. (Patient taking differently: Take 10 mg by mouth 3 (three) times daily as needed (for shaking). )  . [DISCONTINUED] loteprednol (LOTEMAX) 0.5 % ophthalmic suspension nightly as needed   No facility-administered encounter medications on file as of 10/03/2019.     Activities of Daily Living In your present state of health, do you have any difficulty performing the following activities: 10/03/2019  Hearing? N  Vision? N  Difficulty concentrating or making decisions? N  Walking or climbing stairs? N  Dressing or bathing? N  Doing errands, shopping? N  Preparing Food and eating ? N  Using the Toilet? N  In the past six months, have you accidently leaked urine? N  Do you have problems with loss of bowel control? N  Managing your Medications? N  Managing your Finances? N  Housekeeping or managing your Housekeeping? N  Some recent data might be hidden    Patient Care Team: Leone Haven, MD as PCP - General (Family Medicine)    Assessment:   This is a routine wellness examination for [place].  Nurse connected with patient 10/03/19 at 10:30 AM EST by a telephone enabled telemedicine application and verified that I am speaking with the correct person using two identifiers. Patient stated full name and DOB. Patient gave permission to continue with virtual visit. Patient's location was at home and Nurse's location was at [place] office.   Health Maintenance Due: See completed HM at the end of note.   Eye: Visual acuity not assessed. Virtual visit. Wears corrective lenses. Followed by their ophthalmologist.  Dental: UTD  Hearing: Hearing aids- yes  Safety:  Patient feels safe at home- yes Patient does have smoke detectors at home- yes Patient does wear sunscreen or protective clothing when in direct sunlight - yes Patient does wear seat belt when in a moving vehicle - yes Patient drives- yes Adequate lighting in walkways free from debris- yes Grab  bars and handrails used as appropriate- yes Ambulates with no assistive device Cell phone on person when ambulating outside of the home- yes  Social: Alcohol intake - no    Smoking history- never   Smokers in home? none Illicit drug use? none  Depression: PHQ 2 &9 complete. See screening below. Denies irritability, anhedonia, sadness/tearfullness.   Falls: See screening below.    Medication: Taking as directed and without issues.   Covid-19: Precautions and sickness symptoms discussed. Wears mask, social distancing, hand hygiene as appropriate.   Activities of Daily Living Patient denies needing assistance with: household chores, feeding themselves, getting from bed to chair, getting to the toilet, bathing/showering, dressing, managing money, or preparing meals.   Memory: Patient is alert. Patient denies difficulty focusing or concentrating. Correctly identified the president of the [place], season and recall. Patient likes to read, and play the piano for brain stimulation.   BMI- discussed the importance of a healthy diet, water intake and the benefits of aerobic exercise.  Educational material provided.  Physical activity- walking; monitors steps daily 5,000 -10,000.  Diet:  Regular Water: fair intake  Other Providers Patient Care Team: Leone Haven, MD as PCP - General (Family Medicine)  Exercise Activities and Dietary recommendations Current Exercise Habits: Home exercise routine, Type of exercise: walking, Intensity: Mild  Goals      Patient Stated   . Increase physical activity (pt-stated)     Join the [employer] senior program Walk for exercise       Fall Risk Fall Risk  10/03/2019 06/30/2019 01/02/2019 11/28/2018 08/24/2018  Falls in the past year? 0 0 0 0 No  Number falls in past yr: - 0 - 0 -  Injury with Fall? - - - 0 -   Timed Get Up and Go performed: no, virtual visit  Depression Screen PHQ 2/9 Scores 10/03/2019 06/30/2019 08/24/2018 09/27/2017  PHQ - 2  Score 0 0 0 0  Cognitive Function MMSE - Mini Mental State Exam 09/27/2017  Orientation to time 5  Orientation to Place 5  Registration 3  Attention/ Calculation 5  Recall 3  Language- name 2 objects 2  Language- repeat 1  Language- follow 3 step command 3  Language- read & follow direction 1  Write a sentence 1  Copy design 1  Total score 30     6CIT Screen 10/03/2019 09/28/2018  What Year? 0 points 0 points  What month? 0 points 0 points  What time? 0 points 0 points  Count back from 20 0 points 0 points  Months in reverse 0 points 0 points  Repeat phrase - 0 points  Total Score - 0    Immunization History  Administered Date(s) Administered  . Hepatitis A, Ped/Adol-2 Dose 05/16/1960  . Influenza, High Dose Seasonal PF 10/02/2016, 09/14/2017, 08/24/2018, 08/31/2019  . Pneumococcal Conjugate-13 12/07/2013  . Pneumococcal Polysaccharide-23 07/06/2006  . Tdap 07/06/2006, 02/19/2011  . Zoster Recombinat (Shingrix) 02/19/2018   Screening Tests Health Maintenance  Topic Date Due  . TETANUS/TDAP  02/18/2021  . INFLUENZA VACCINE  Completed  . DEXA SCAN  Completed  . PNA vac Low Risk Adult  Completed      Plan:   Keep all routine maintenance appointments.   Follow up with your doctor today @ 11:00.  Medicare Attestation I have personally reviewed: The patient's medical and social history Their use of alcohol, tobacco or illicit drugs Their current medications and supplements The patient's functional ability including ADLs,fall risks, home safety risks, cognitive, and hearing and visual impairment Diet and physical activities Evidence for depression   In addition, I have reviewed and discussed with patient certain preventive protocols, quality metrics, and best practice recommendations. A written personalized care plan for preventive services as well as general preventive health recommendations were provided to patient via mail.     Varney Biles,  LPN  [id number]

## 2019-10-03 NOTE — Patient Instructions (Signed)
Nice to see you. We will get lab work today and contact you with the results. We will check on your pulmonology referral.

## 2019-10-03 NOTE — Assessment & Plan Note (Signed)
Improved with daily magnesium supplement.  Discussed checking magnesium level.

## 2019-10-03 NOTE — Progress Notes (Signed)
Tommi Rumps, MD Phone: [phone]  Barbara Garcia is a 78 y.o. female who presents today for follow-up.  Dyspnea on exertion: Patient saw cardiology.  They recommended that she complete the PFTs and then if they were normal they would consider further evaluation.  PFTs revealed restrictive disease.  She notes mild dyspnea on exertion when she is up moving around or talking lots.  No chest pain.  She has been referred to pulmonology for the restrictive disease though has not heard anything yet.  Iron deficiency anemia: She has been taking her iron supplement daily.  She does feel better and notes she has more energy.  No bleeding issues.  She underwent EGD and colonoscopy with no obvious cause for her iron deficiency.  Constipation: Patient notes this has improved quite a bit with use of magnesium supplementation.  She has bowel movements most days.  No abdominal pain.  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  Notes a current patch on her right flank has been occurring for 3 to 4 months.  It itches.  Triamcinolone does help some though does not resolve it.  Notes previously tried multiple other creams with no benefit.  Social History   Tobacco Use  Smoking Status Never Smoker  Smokeless Tobacco Never Used     ROS see history of present illness  Objective  Physical Exam Vitals:   10/03/19 1123  BP: 140/80  Pulse: 77  Temp: 97.6 F (36.4 C)  SpO2: 97%    BP Readings from Last 3 Encounters:  10/03/19 140/80  08/28/19 (!) 146/72  08/07/19 (!) 156/82   Wt Readings from Last 3 Encounters:  10/03/19 134 lb (60.8 kg)  08/28/19 133 lb 6 oz (60.5 kg)  08/07/19 134 lb (60.8 kg)    Physical Exam Constitutional:      General: She is not in acute distress.    Appearance: She is not diaphoretic.  Cardiovascular:     Rate and Rhythm: Normal rate and regular rhythm.     Heart sounds: Normal heart sounds.  Pulmonary:     Effort:  Pulmonary effort is normal.     Breath sounds: Normal breath sounds.  Skin:    General: Skin is warm and dry.       Neurological:     Mental Status: She is alert.      Assessment/Plan: Please see individual problem list.  Chronic constipation Improved with daily magnesium supplement.  Discussed checking magnesium level.  Dyspnea on exertion Relatively stable.  Given restrictive findings on PFTs we will have her see pulmonology.  Discussed working on conditioning.  Iron deficiency anemia Recheck iron levels.  She will continue iron supplementation.  Neurodermatitis I encouraged her to contact her dermatologist.   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  She notes it has been positive over the last 10 years or so.  She did see a rheumatologist initially and they noted she did not have rheumatoid arthritis.  She notes no significant joint issues.  She does have some DIP joint enlargement in her fingers.  She will monitor for symptoms of joint pain and rheumatoid arthritis though I suspect she does not have rheumatoid arthritis.  Health Maintenance: Patient is due for Tdap.  This is sent to her pharmacy.  Orders Placed This Encounter  Procedures  . CBC  . IBC + Ferritin  . Comp Met (CMET)  . Lipid panel  . Magnesium    Meds ordered this  encounter  Medications  . ALPRAZolam (XANAX) 0.5 MG tablet    Sig: Take 0.5 tablets (0.25 mg total) by mouth at bedtime as needed for anxiety.    Dispense:  15 tablet    Refill:  0  . Tdap (BOOSTRIX) 5-2.5-18.5 LF-MCG/0.5 injection    Sig: Inject 0.5 mLs into the muscle once for 1 dose.    Dispense:  0.5 mL    Refill:  0     Tommi Rumps, MD [place]

## 2019-10-03 NOTE — Assessment & Plan Note (Signed)
I encouraged her to contact her dermatologist.

## 2019-10-03 NOTE — Assessment & Plan Note (Addendum)
Relatively stable.  Given restrictive findings on PFTs we will have her see pulmonology.  Discussed working on conditioning.

## 2019-10-03 NOTE — Assessment & Plan Note (Signed)
Recheck iron levels.  She will continue iron supplementation.

## 2019-10-03 NOTE — Progress Notes (Signed)
I have reviewed the above note and agree.  Ezio Wieck, M.D.  

## 2019-10-03 NOTE — Patient Instructions (Addendum)
  Barbara Garcia , Thank you for taking time to come for your Medicare Wellness Visit. I appreciate your ongoing commitment to your health goals. Please review the following plan we discussed and let me know if I can assist you in the future.   These are the goals we discussed: Goals      Patient Stated   . Increase physical activity (pt-stated)     Join the [employer] senior program Walk for exercise       This is a list of the screening recommended for you and due dates:  Health Maintenance  Topic Date Due  . Tetanus Vaccine  02/18/2021  . Flu Shot  Completed  . DEXA scan (bone density measurement)  Completed  . Pneumonia vaccines  Completed

## 2019-10-04 ENCOUNTER — Other Ambulatory Visit: Payer: Self-pay | Admitting: Family Medicine

## 2019-10-04 DIAGNOSIS — D72819 Decreased white blood cell count, unspecified: Secondary | ICD-10-CM

## 2019-10-08 ENCOUNTER — Other Ambulatory Visit: Payer: Self-pay | Admitting: Family Medicine

## 2019-11-03 DIAGNOSIS — L570 Actinic keratosis: Secondary | ICD-10-CM | POA: Diagnosis not present

## 2019-11-03 DIAGNOSIS — L309 Dermatitis, unspecified: Secondary | ICD-10-CM | POA: Diagnosis not present

## 2019-11-03 DIAGNOSIS — X32XXXA Exposure to sunlight, initial encounter: Secondary | ICD-10-CM | POA: Diagnosis not present

## 2019-11-13 ENCOUNTER — Other Ambulatory Visit: Payer: Self-pay

## 2019-11-13 ENCOUNTER — Encounter: Payer: Self-pay | Admitting: Pulmonary Disease

## 2019-11-13 ENCOUNTER — Ambulatory Visit (INDEPENDENT_AMBULATORY_CARE_PROVIDER_SITE_OTHER): Payer: Medicare Other | Admitting: Pulmonary Disease

## 2019-11-13 VITALS — BP 135/70 | HR 71 | Temp 97.7°F | Ht 65.0 in | Wt 134.4 lb

## 2019-11-13 DIAGNOSIS — R0600 Dyspnea, unspecified: Secondary | ICD-10-CM

## 2019-11-13 DIAGNOSIS — R0609 Other forms of dyspnea: Secondary | ICD-10-CM

## 2019-11-13 NOTE — Progress Notes (Signed)
Subjective:   PATIENT ID: Barbara Garcia GENDER: female DOB: 1941/04/15, MRN: [id number]   HPI  Chief Complaint  Patient presents with  . Consult    referred by PCP for sob, PNA.      Reason for Visit: New consult for shortness of breath  [name] is a 78 year old female never smoker who presents with shortness of breath.  For the last year, she has noticed mild dyspnea on exertion that has gradually worsened. [facility] She would walk or use the elliptical for 2 miles and do strength training on the machines. Since the pandemic, she has been able to enjoy her regular activity. She recently notices having dyspnea on exertion when walking uphill after getting her mail. She does not need to stop and rest during this activity. She does well on level ground and usually her dyspnea resolves when returning the level areas. Denies associated wheezing or cough. She was evaluated by her PCP and noted to have CXR concerning for COPD or chronic bronchitis. She also had pulmonary function tests completed concerning for possible mild restrictive lung disease.  Social History: Never smoker  Environmental exposures: Psychologist, occupational as a hobby, infrequent exposure per patient  I have personally reviewed patient's past medical/family/social history, allergies, current medications.  Past Medical History:  Diagnosis Date  . Allergy   . Anxiety   . Arthritis   . Benign essential tremor   . Chronic dermatitis   . Colon polyps   . Combined form of senile cataract   . Constipation   . GERD (gastroesophageal reflux disease)   . Hearing loss   . Hemorrhoids   . History of cystocele   . History of shingles   . Hyperglycemia   . Hyperlipidemia   . Hyperopia with astigmatism and presbyopia   . Hypertension   . Iris nevus   . MVP (mitral valve prolapse)   . Osteopenia   . Osteoporosis   . Posterior vitreous detachment,  bilateral   . Retinal drusen, bilateral   . Sjogrens syndrome ([place])   . Vitamin D deficiency      Family History  Problem Relation Age of Onset  . Hypertension Mother   . Cancer Mother   . Stroke Mother   . Heart disease Mother   . Heart failure Mother   . Heart disease Father   . Hyperlipidemia Father   . Hypertension Father   . Heart attack Father   . Arthritis Sister   . Hypertension Sister   . Kidney disease Sister   . Stroke Sister   . Heart Problems Sister   . Heart disease Sister   . Heart Problems Brother   . Heart Problems Brother   . Heart Problems Brother   . Heart Problems Brother   . Heart Problems Sister   . Hypertension Sister   . Breast cancer Neg Hx      Social History   Occupational History  . Not on file  Tobacco Use  . Smoking status: Never Smoker  . Smokeless tobacco: Never Used  Substance and Sexual Activity  . Alcohol use: No  . Drug use: No  . Sexual activity: Never    Partners: Male    Allergies  Allergen Reactions  . Lisinopril Cough     Outpatient Medications Prior to Visit  Medication Sig Dispense Refill  . albuterol (VENTOLIN HFA) 108 (90 Base) MCG/ACT inhaler Inhale  2 puffs into the lungs every 6 (six) hours as needed for wheezing or shortness of breath. 18 g 0  . ALPRAZolam (XANAX) 0.5 MG tablet Take 0.5 tablets (0.25 mg total) by mouth at bedtime as needed for anxiety. 15 tablet 0  . escitalopram (LEXAPRO) 5 MG tablet TAKE 1 TABLET BY MOUTH  DAILY 90 tablet 2  . Magnesium Citrate 200 MG TABS Take 200 mg by mouth daily.    . Multiple Vitamin (MULTI-VITAMINS) TABS Take 1 tablet by mouth daily.     . pantoprazole (PROTONIX) 40 MG tablet Take 40 mg by mouth daily.    . Polyvinyl Alcohol-Povidone PF (REFRESH) 1.4-0.6 % SOLN Place 1 drop into both eyes daily as needed (for dry eyes).    . propranolol (INDERAL) 10 MG tablet Take 1 tablet (10 mg total) by mouth 3 (three) times daily as needed. (Patient taking differently: Take 10 mg  by mouth 3 (three) times daily as needed (for shaking). ) 270 tablet 2  . triamcinolone cream (KENALOG) 0.1 % APPLY TO AFFECTED AREA TWICE A DAY 30 g 0   No facility-administered medications prior to visit.    Review of Systems  Constitutional: Negative for chills, diaphoresis, fever, malaise/fatigue and weight loss.  HENT: Negative for congestion, ear pain and sore throat.   Respiratory: Positive for cough and shortness of breath. Negative for hemoptysis, sputum production and wheezing.   Cardiovascular: Negative for chest pain, palpitations and leg swelling.  Gastrointestinal: Negative for abdominal pain, heartburn and nausea.  Genitourinary: Negative for frequency.  Musculoskeletal: Negative for joint pain and myalgias.  Skin: Negative for itching and rash.  Neurological: Negative for dizziness, weakness and headaches.  Endo/Heme/Allergies: Does not bruise/bleed easily.  Psychiatric/Behavioral: Negative for depression. The patient is not nervous/anxious.      Objective:   Vitals:   11/13/19 1020  BP: 135/70  Pulse: 71  Temp: 97.7 F (36.5 C)  TempSrc: Temporal  SpO2: 99%  Weight: 134 lb 6.4 oz (61 kg)  Height: 5\' 5"  (1.651 m)     Physical Exam: General: Thin, well-appearing, no acute distress HENT: [place], AT Eyes: EOMI, no scleral icterus Respiratory: Clear to auscultation bilaterally.  No crackles, wheezing or rales Cardiovascular: RRR, -M/R/G, no JVD GI: BS+, soft, nontender Extremities:-Edema,-tenderness Neuro: AAO x4, CNII-XII grossly intact Skin: Intact, no rashes or bruising Psych: Normal mood, normal affect  Data Reviewed:  Imaging: CXR 06/26/19 - Hyperinflation and peribronchial thickening   PFT: 09/12/19 FVC 2.24 (80%) FEV1 1.8 (86%) Ratio 79  TLC 84% DLCO 93% Interpretation: Reduced FVC however normal TLC. No evidence of obstructive or restrictive defect present. Normal gas exchange.  Labs: CBC    Component Value Date/Time   WBC 2.9 (L) 10/03/2019  1129   RBC 4.15 10/03/2019 1129   HGB 12.4 10/03/2019 1129   HCT 36.6 10/03/2019 1129   PLT 204.0 10/03/2019 1129   MCV 88.3 10/03/2019 1129   MCH 28.3 05/23/2019 1138   MCHC 33.7 10/03/2019 1129   RDW 14.8 10/03/2019 1129   BMET    Component Value Date/Time   NA 136 10/03/2019 1129   NA 140 09/22/2016 0000   K 4.4 10/03/2019 1129   CL 103 10/03/2019 1129   CO2 28 10/03/2019 1129   GLUCOSE 100 (H) 10/03/2019 1129   BUN 19 10/03/2019 1129   BUN 15 09/22/2016 0000   CREATININE 0.82 10/03/2019 1129   CALCIUM 9.1 10/03/2019 1129   GFRNONAA >60 05/23/2019 1138   GFRAA >60 05/23/2019 1138  Imaging, labs and tests noted above have been reviewed independently by me.    Assessment & Plan:   Discussion: 78 year old female never smoker with progressive but mild dyspnea on exertion over the last nine months. No significant or known environmental or toxic exposures. Though she does enjoy ceramic making from time to time. I personally reviewed her PFTs which do demonstrated a borderline reduced FVC however in the setting of normal lung volumes, this does not represent a restrictive defect. We discussed her CXR findings which do suggest an obstructive picture but once again no functional defects noted on her PFTs. Overall, her clinical picture suggests deconditioning and we could pursue a CPET to confirm this however given how mild her symptoms are, I would instead encourage resuming regular activity and using SABA as needed. If symptoms persist or worsen we discussed considering advanced inhaler therapies we could trial since her physical presentation may benefit from bronchodilators.   Shortness of breath We reviewed your available tests including your chest X-ray which demonstrates hyper-expanded lungs and changes suggestive of chronic bronchitis. However your pulmonary function tests are normal with NO evidence of obstructive or restrictive lung disease. After discussing your pulmonary  history and symptoms, these seem consistent with deconditioning.   Recommend: --Restarting regular aerobic exercise (walking, biking, elliptical etc) --Take albuterol TWO puffs as needed prior to exercise  --If you develop symptoms of chronic bronchitis (cough, wheeze or shortness of breath) that does not improve on albuterol, please return to clinic to consider other inhalers to help  Health Maintenance Immunization History  Administered Date(s) Administered  . Hepatitis A, Ped/Adol-2 Dose 05/16/1959  . Influenza, High Dose Seasonal PF 10/02/2016, 09/14/2017, 08/24/2018, 08/31/2019  . Pneumococcal Conjugate-13 12/07/2013  . Pneumococcal Polysaccharide-23 07/06/2006  . Tdap 07/06/2006, 02/19/2011, 11/01/2019  . Zoster Recombinat (Shingrix) 02/19/2018, 07/26/2019   CT Lung Screen - not indicated  No orders of the defined types were placed in this encounter. No orders of the defined types were placed in this encounter.   Return if symptoms worsen or fail to improve.  [place] Gates, MD [facility] 11/13/2019 10:04 AM  Office Number [phone]

## 2019-11-13 NOTE — Patient Instructions (Signed)
Shortness of breath We reviewed your available tests including your chest X-ray which demonstrates hyper-expanded lungs and changes suggestive of chronic bronchitis. However your pulmonary function tests are normal with NO evidence of obstructive or restrictive lung disease. After discussing your pulmonary history and symptoms, these seem consistent with deconditioning.   Recommend: --Restarting regular aerobic exercise (walking, biking, elliptical etc) --Take albuterol TWO puffs as needed prior to exercise  --If you develop symptoms of chronic bronchitis (cough, wheeze or shortness of breath) that does not improve on albuterol, please return to clinic to consider other inhalers to help  Follow-up with me as needed

## 2019-12-04 ENCOUNTER — Other Ambulatory Visit: Payer: Self-pay

## 2019-12-04 ENCOUNTER — Other Ambulatory Visit (INDEPENDENT_AMBULATORY_CARE_PROVIDER_SITE_OTHER): Payer: Medicare Other

## 2019-12-04 DIAGNOSIS — D72819 Decreased white blood cell count, unspecified: Secondary | ICD-10-CM

## 2019-12-04 LAB — CBC WITH DIFFERENTIAL/PLATELET
Basophils Absolute: 0 10*3/uL (ref 0.0–0.1)
Basophils Relative: 0.9 % (ref 0.0–3.0)
Eosinophils Absolute: 0.1 10*3/uL (ref 0.0–0.7)
Eosinophils Relative: 1.7 % (ref 0.0–5.0)
HCT: 39.4 % (ref 36.0–46.0)
Hemoglobin: 13.4 g/dL (ref 12.0–15.0)
Lymphocytes Relative: 37.1 % (ref 12.0–46.0)
Lymphs Abs: 1.5 10*3/uL (ref 0.7–4.0)
MCHC: 34 g/dL (ref 30.0–36.0)
MCV: 90.3 fl (ref 78.0–100.0)
Monocytes Absolute: 0.5 10*3/uL (ref 0.1–1.0)
Monocytes Relative: 11.4 % (ref 3.0–12.0)
Neutro Abs: 2 10*3/uL (ref 1.4–7.7)
Neutrophils Relative %: 48.9 % (ref 43.0–77.0)
Platelets: 208 10*3/uL (ref 150.0–400.0)
RBC: 4.36 Mil/uL (ref 3.87–5.11)
RDW: 13.3 % (ref 11.5–15.5)
WBC: 4.1 10*3/uL (ref 4.0–10.5)

## 2019-12-29 ENCOUNTER — Ambulatory Visit (INDEPENDENT_AMBULATORY_CARE_PROVIDER_SITE_OTHER): Payer: Medicare Other | Admitting: Family Medicine

## 2019-12-29 ENCOUNTER — Encounter: Payer: Self-pay | Admitting: Family Medicine

## 2019-12-29 ENCOUNTER — Other Ambulatory Visit: Payer: Self-pay

## 2019-12-29 VITALS — BP 138/64 | HR 80 | Temp 98.0°F | Ht 65.0 in | Wt 134.1 lb

## 2019-12-29 DIAGNOSIS — Z7689 Persons encountering health services in other specified circumstances: Secondary | ICD-10-CM

## 2019-12-29 DIAGNOSIS — F5104 Psychophysiologic insomnia: Secondary | ICD-10-CM

## 2019-12-29 DIAGNOSIS — L280 Lichen simplex chronicus: Secondary | ICD-10-CM | POA: Diagnosis not present

## 2019-12-29 MED ORDER — ALPRAZOLAM 0.5 MG PO TABS: 0.2500 mg | ORAL_TABLET | Freq: Every evening | ORAL | 0 refills | Status: DC | PRN

## 2019-12-29 NOTE — Patient Instructions (Signed)
Good to meet you today  Please follow up in 6 months for complete physical and labs

## 2019-12-29 NOTE — Progress Notes (Signed)
   Subjective:    Patient ID: Barbara Garcia, female    DOB: 04-20-1941, 79 y.o.   MRN: [id number]  HPI Chief Complaint  Patient presents with  . New Patient (Initial Visit)    TOC from Dr Caryl Bis.    This is a 79 yo female who presents today to establish care. She is married. Her husband uses a walker. Enjoys sewing and crafting. Goes out to lunch with her daughter.   Last CPE- Medicare AWV Mammo- 01/24/2018 Colonoscopy- 08/03/2019 Tdap- 11/01/2019 Flu- annual Eye- regular Exercise- [facility]    Review of Systems She denies chest pain, SOB, abdominal pain, diarrhea/ constipation.     Objective:   Physical Exam Physical Exam  Constitutional: Oriented to person, place, and time. She appears well-developed and well-nourished.  HENT:  Head: Normocephalic and atraumatic.  Eyes: Conjunctivae are normal.  Neck: Normal range of motion. Neck supple.  Cardiovascular: Normal rate, regular rhythm and normal heart sounds.   Pulmonary/Chest: Effort normal and breath sounds normal.  Musculoskeletal: Normal range of motion.  Neurological: Alert and oriented to person, place, and time.  Skin: Skin is warm and dry.  Psychiatric: Normal mood and affect. Behavior is normal. Judgment and thought content normal.  Vitals reviewed.   BP 138/64 (BP Location: Right Arm, Patient Position: Sitting, Cuff Size: Normal)   Pulse 80   Temp 98 F (36.7 C) (Temporal)   Ht 5\' 5"  (1.651 m)   Wt 134 lb 1.9 oz (60.8 kg)   SpO2 97%   BMI 22.32 kg/m  Wt Readings from Last 3 Encounters:  12/29/19 134 lb 1.9 oz (60.8 kg)  11/13/19 134 lb 6.4 oz (61 kg)  10/03/19 134 lb (60.8 kg)      Assessment & Plan:  1. Encounter to establish care - Reviewed records and last labs - follow up in 6 months for annual exam  2. Psychophysiological insomnia - per patient, she uses a couple of times a month - ALPRAZolam (XANAX) 0.5 MG tablet; Take 0.5 tablets (0.25 mg total) by mouth at bedtime  as needed for anxiety.  Dispense: 15 tablet; Refill: 0  3. Neurodermatitis - well controlled with gabapentin 100 mg tid prn  This visit occurred during the SARS-CoV-2 public health emergency.  Safety protocols were in place, including screening questions prior to the visit, additional usage of staff PPE, and extensive cleaning of exam room while observing appropriate contact time as indicated for disinfecting solutions.    Clarene Reamer, FNP-BC  [facility] at [facility]  12/29/2019 11:12 AM

## 2020-03-11 DIAGNOSIS — D2272 Melanocytic nevi of left lower limb, including hip: Secondary | ICD-10-CM | POA: Diagnosis not present

## 2020-03-11 DIAGNOSIS — L270 Generalized skin eruption due to drugs and medicaments taken internally: Secondary | ICD-10-CM | POA: Diagnosis not present

## 2020-03-11 DIAGNOSIS — D2261 Melanocytic nevi of right upper limb, including shoulder: Secondary | ICD-10-CM | POA: Diagnosis not present

## 2020-03-11 DIAGNOSIS — D2262 Melanocytic nevi of left upper limb, including shoulder: Secondary | ICD-10-CM | POA: Diagnosis not present

## 2020-03-11 DIAGNOSIS — L538 Other specified erythematous conditions: Secondary | ICD-10-CM | POA: Diagnosis not present

## 2020-03-11 DIAGNOSIS — D225 Melanocytic nevi of trunk: Secondary | ICD-10-CM | POA: Diagnosis not present

## 2020-03-11 DIAGNOSIS — L820 Inflamed seborrheic keratosis: Secondary | ICD-10-CM | POA: Diagnosis not present

## 2020-03-11 DIAGNOSIS — D2271 Melanocytic nevi of right lower limb, including hip: Secondary | ICD-10-CM | POA: Diagnosis not present

## 2020-03-11 DIAGNOSIS — L570 Actinic keratosis: Secondary | ICD-10-CM | POA: Diagnosis not present

## 2020-03-11 DIAGNOSIS — L821 Other seborrheic keratosis: Secondary | ICD-10-CM | POA: Diagnosis not present

## 2020-03-11 DIAGNOSIS — X32XXXA Exposure to sunlight, initial encounter: Secondary | ICD-10-CM | POA: Diagnosis not present

## 2020-04-01 ENCOUNTER — Ambulatory Visit: Payer: Medicare Other | Admitting: Family Medicine

## 2020-04-05 DIAGNOSIS — L309 Dermatitis, unspecified: Secondary | ICD-10-CM | POA: Diagnosis not present

## 2020-05-10 IMAGING — DX CHEST - 2 VIEW
2 series · 2 of 2 positions shown · non-contrast
Comparison: No prior.

CLINICAL DATA: Chest pain.

EXAM:
CHEST - 2 VIEW

[chest pa]
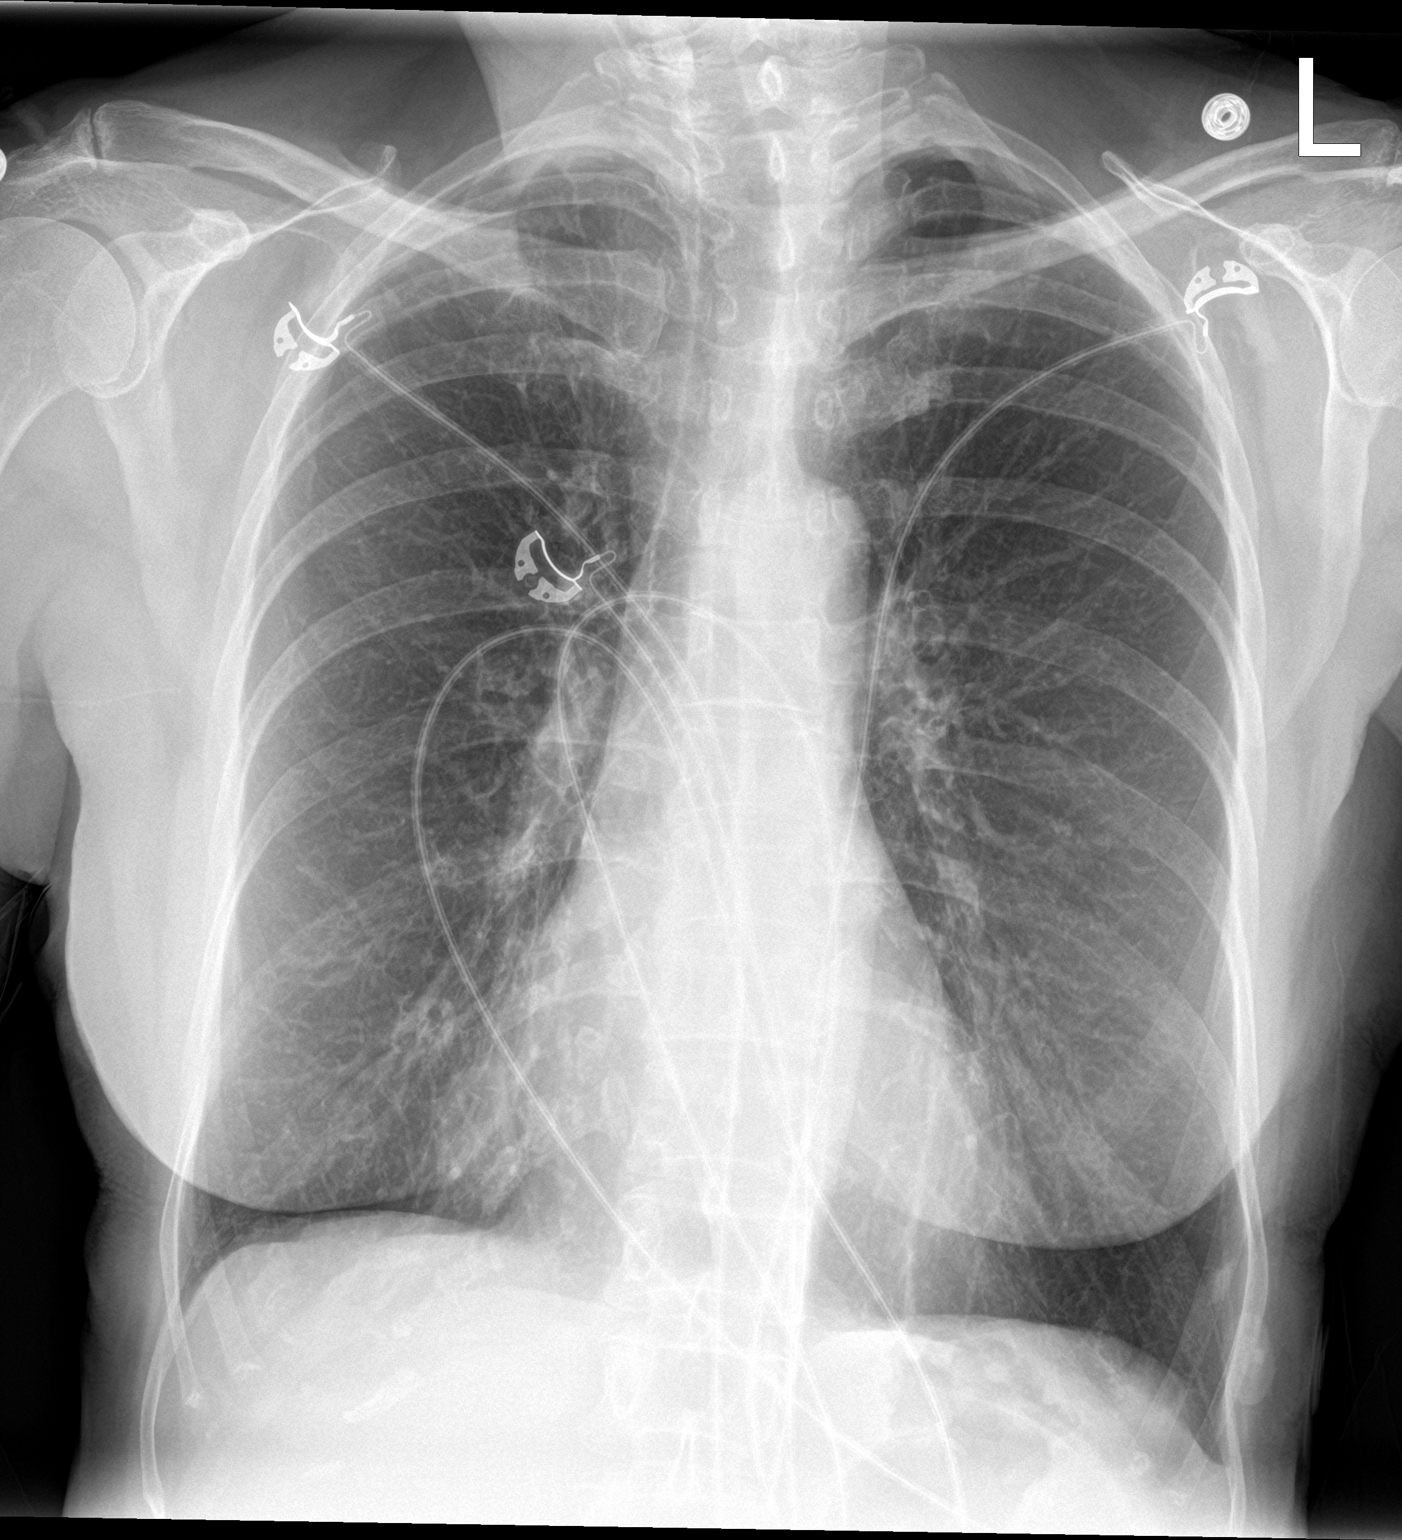

[chest lat]
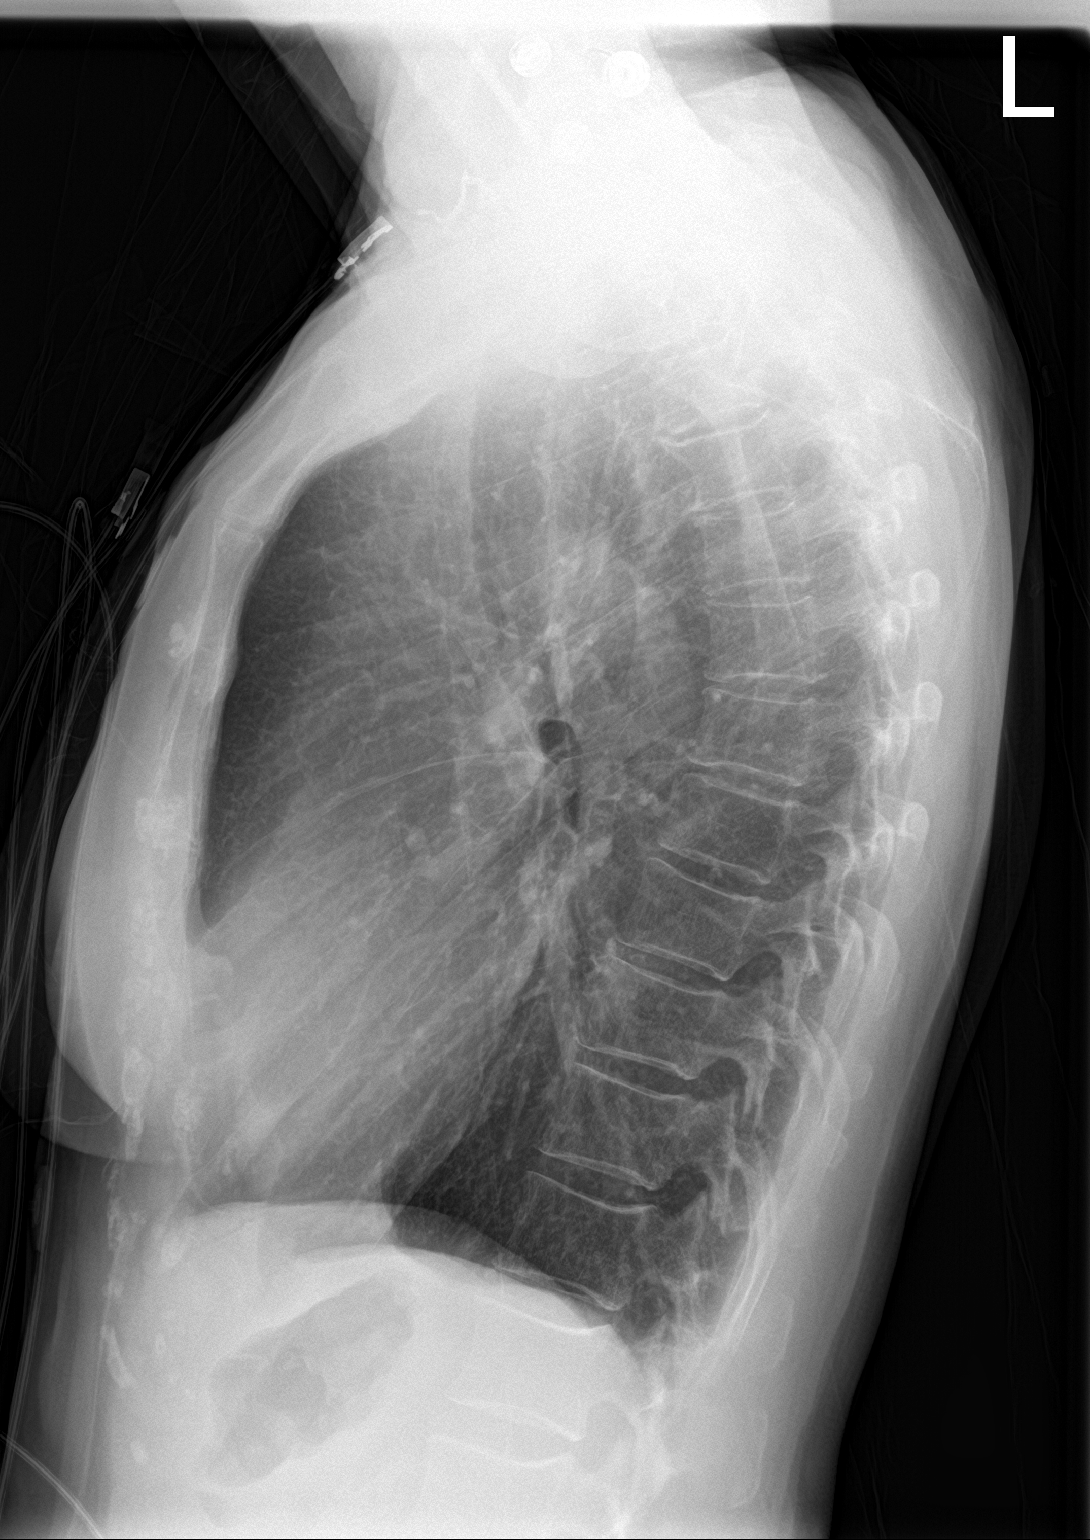

[2 of 2 positions shown; findings below may reference images not displayed]

FINDINGS: Mediastinum and hilar structures normal. Heart size normal. No
pulmonary venous congestion. Mild right middle lobe
atelectasis/infiltrate. Biapical pleural-parenchymal thickening
consistent scarring. No pleural effusion or pneumothorax. No acute
bony abnormality.
IMPRESSION: Mild right middle lobe atelectasis/infiltrate. Biapical
pleural-parenchymal thickening consistent scarring.

## 2020-06-03 ENCOUNTER — Encounter: Payer: Self-pay | Admitting: Family Medicine

## 2020-06-05 DIAGNOSIS — Z9289 Personal history of other medical treatment: Secondary | ICD-10-CM | POA: Diagnosis not present

## 2020-06-05 DIAGNOSIS — Z1231 Encounter for screening mammogram for malignant neoplasm of breast: Secondary | ICD-10-CM | POA: Diagnosis not present

## 2020-06-10 ENCOUNTER — Other Ambulatory Visit: Payer: Self-pay | Admitting: Family Medicine

## 2020-06-10 MED ORDER — ESCITALOPRAM OXALATE 5 MG PO TABS: 5.0000 mg | ORAL_TABLET | Freq: Every day | ORAL | 3 refills | Status: DC

## 2020-06-10 NOTE — Telephone Encounter (Signed)
Please call patient and let her know that I have sent in a refill of her escitalopram 5 mg to her mail order pharmacy. Does she want me to send in a 2 week supply to a local pharmacy?

## 2020-06-10 NOTE — Telephone Encounter (Signed)
Last refilled escitalopram (LEXAPRO) 5 MG tablet #90 x 0 refills. Pt last OV was [facility] 12/29/19  Please advise, thanks.

## 2020-06-10 NOTE — Telephone Encounter (Signed)
Left a detailed message on verified machine to return call regarding short supply to local pharmacy

## 2020-06-12 ENCOUNTER — Other Ambulatory Visit: Payer: Self-pay | Admitting: Family Medicine

## 2020-06-12 MED ORDER — ESCITALOPRAM OXALATE 5 MG PO TABS: 5.0000 mg | ORAL_TABLET | Freq: Every day | ORAL | 0 refills | Status: DC

## 2020-06-17 ENCOUNTER — Other Ambulatory Visit: Payer: Self-pay | Admitting: Family Medicine

## 2020-06-17 DIAGNOSIS — E782 Mixed hyperlipidemia: Secondary | ICD-10-CM

## 2020-06-17 DIAGNOSIS — E559 Vitamin D deficiency, unspecified: Secondary | ICD-10-CM

## 2020-06-17 DIAGNOSIS — M8580 Other specified disorders of bone density and structure, unspecified site: Secondary | ICD-10-CM

## 2020-06-17 DIAGNOSIS — D509 Iron deficiency anemia, unspecified: Secondary | ICD-10-CM

## 2020-06-24 ENCOUNTER — Other Ambulatory Visit: Payer: Self-pay

## 2020-06-24 ENCOUNTER — Other Ambulatory Visit (INDEPENDENT_AMBULATORY_CARE_PROVIDER_SITE_OTHER): Payer: Medicare Other

## 2020-06-24 DIAGNOSIS — D509 Iron deficiency anemia, unspecified: Secondary | ICD-10-CM

## 2020-06-24 DIAGNOSIS — E559 Vitamin D deficiency, unspecified: Secondary | ICD-10-CM | POA: Diagnosis not present

## 2020-06-24 DIAGNOSIS — E782 Mixed hyperlipidemia: Secondary | ICD-10-CM

## 2020-06-24 DIAGNOSIS — M8580 Other specified disorders of bone density and structure, unspecified site: Secondary | ICD-10-CM | POA: Diagnosis not present

## 2020-06-24 LAB — LIPID PANEL
Cholesterol: 227 mg/dL — ABNORMAL HIGH (ref 0–200)
HDL: 60.3 mg/dL (ref >39.00)
LDL Cholesterol: 147 mg/dL — ABNORMAL HIGH (ref 0–99)
NonHDL: 166.91
Total CHOL/HDL Ratio: 4
Triglycerides: 102 mg/dL (ref 0.0–149.0)
VLDL: 20.4 mg/dL (ref 0.0–40.0)

## 2020-06-24 LAB — VITAMIN D 25 HYDROXY (VIT D DEFICIENCY, FRACTURES): VITD: 27.15 ng/mL — ABNORMAL LOW (ref 30.00–100.00)

## 2020-06-24 LAB — BASIC METABOLIC PANEL
BUN: 21 mg/dL (ref 6–23)
CO2: 28 mEq/L (ref 19–32)
Calcium: 9.4 mg/dL (ref 8.4–10.5)
Chloride: 105 mEq/L (ref 96–112)
Creatinine, Ser: 0.88 mg/dL (ref 0.40–1.20)
GFR: 61.83 mL/min (ref >60.00)
Glucose, Bld: 91 mg/dL (ref 70–99)
Potassium: 5.3 mEq/L — ABNORMAL HIGH (ref 3.5–5.1)
Sodium: 138 mEq/L (ref 135–145)

## 2020-06-24 LAB — CBC WITH DIFFERENTIAL/PLATELET
Basophils Absolute: 0 10*3/uL (ref 0.0–0.1)
Basophils Relative: 1.2 % (ref 0.0–3.0)
Eosinophils Absolute: 0.1 10*3/uL (ref 0.0–0.7)
Eosinophils Relative: 2.8 % (ref 0.0–5.0)
HCT: 36.9 % (ref 36.0–46.0)
Hemoglobin: 12.2 g/dL (ref 12.0–15.0)
Lymphocytes Relative: 33.1 % (ref 12.0–46.0)
Lymphs Abs: 1.2 10*3/uL (ref 0.7–4.0)
MCHC: 33.2 g/dL (ref 30.0–36.0)
MCV: 90.2 fl (ref 78.0–100.0)
Monocytes Absolute: 0.4 10*3/uL (ref 0.1–1.0)
Monocytes Relative: 10.9 % (ref 3.0–12.0)
Neutro Abs: 1.9 10*3/uL (ref 1.4–7.7)
Neutrophils Relative %: 52 % (ref 43.0–77.0)
Platelets: 184 10*3/uL (ref 150.0–400.0)
RBC: 4.09 Mil/uL (ref 3.87–5.11)
RDW: 12.8 % (ref 11.5–15.5)
WBC: 3.6 10*3/uL — ABNORMAL LOW (ref 4.0–10.5)

## 2020-06-25 ENCOUNTER — Ambulatory Visit: Payer: Medicare Other

## 2020-06-28 ENCOUNTER — Encounter: Payer: Self-pay | Admitting: Family Medicine

## 2020-06-28 ENCOUNTER — Ambulatory Visit (INDEPENDENT_AMBULATORY_CARE_PROVIDER_SITE_OTHER): Payer: Medicare Other | Admitting: Family Medicine

## 2020-06-28 ENCOUNTER — Other Ambulatory Visit: Payer: Self-pay

## 2020-06-28 VITALS — BP 120/66 | HR 79 | Temp 97.7°F | Wt 133.0 lb

## 2020-06-28 DIAGNOSIS — L280 Lichen simplex chronicus: Secondary | ICD-10-CM | POA: Diagnosis not present

## 2020-06-28 DIAGNOSIS — K219 Gastro-esophageal reflux disease without esophagitis: Secondary | ICD-10-CM

## 2020-06-28 DIAGNOSIS — E559 Vitamin D deficiency, unspecified: Secondary | ICD-10-CM

## 2020-06-28 DIAGNOSIS — E875 Hyperkalemia: Secondary | ICD-10-CM | POA: Diagnosis not present

## 2020-06-28 DIAGNOSIS — F5104 Psychophysiologic insomnia: Secondary | ICD-10-CM | POA: Diagnosis not present

## 2020-06-28 LAB — BASIC METABOLIC PANEL
BUN: 21 mg/dL (ref 6–23)
CO2: 32 mEq/L (ref 19–32)
Calcium: 9.6 mg/dL (ref 8.4–10.5)
Chloride: 104 mEq/L (ref 96–112)
Creatinine, Ser: 0.86 mg/dL (ref 0.40–1.20)
GFR: 63.49 mL/min (ref >60.00)
Glucose, Bld: 88 mg/dL (ref 70–99)
Potassium: 4.5 mEq/L (ref 3.5–5.1)
Sodium: 139 mEq/L (ref 135–145)

## 2020-06-28 MED ORDER — PANTOPRAZOLE SODIUM 20 MG PO TBEC: 20.0000 mg | DELAYED_RELEASE_TABLET | Freq: Every day | ORAL | 2 refills | Status: DC

## 2020-06-28 NOTE — Progress Notes (Signed)
   Subjective:    Patient ID: Barbara Garcia, female    DOB: 06/13/1941, 79 y.o.   MRN: [id number]  HPI Chief Complaint  Patient presents with  . Follow-up   This is an 79 yo female who presents today for follow up of chronic medical conditions.  Had labs done earlier this week.   GERD- taking pantoprazole 40 mg prn, usually takes 3x/ week  Insomnia- sleeping poorly some night a week. Has tried melatonin. Usually worse when under stress. Good relief with occasional alprazolam 0.5 mg. Usually 30 will last her 6 months.   Dermatitis- had a flare and was prescribed diprolene with good results.   Elevated potassium on last labs. Had felt a little nauseous. Drinks decaf tea, little water.   Vitamin D- low and she has started otc supplement  Review of Systems Denies chest pain, SOB, palpitations, abdominal pain, diarrhea, constipation, dysuria, hematuria, leg swelling.     Objective:   Physical Exam Vitals reviewed.  Constitutional:      Appearance: Normal appearance. She is normal weight.  HENT:     Head: Normocephalic and atraumatic.  Cardiovascular:     Rate and Rhythm: Normal rate and regular rhythm.     Heart sounds: Normal heart sounds.  Pulmonary:     Effort: Pulmonary effort is normal.     Breath sounds: Normal breath sounds.  Musculoskeletal:     Right lower leg: No edema.     Left lower leg: No edema.  Skin:    General: Skin is warm and dry.  Neurological:     Mental Status: She is alert and oriented to person, place, and time.  Psychiatric:        Mood and Affect: Mood normal.        Behavior: Behavior normal.        Thought Content: Thought content normal.        Judgment: Judgment normal.          BP 120/66   Pulse 79   Temp 97.7 F (36.5 C) (Temporal)   Wt 133 lb (60.3 kg)   SpO2 98%   BMI 22.13 kg/m  Wt Readings from Last 3 Encounters:  06/28/20 133 lb (60.3 kg)  12/29/19 134 lb 1.9 oz (60.8 kg)  11/13/19 134 lb 6.4 oz (61 kg)     Assessment & Plan:  1. Hyperkalemia - Basic Metabolic Panel  2. Psychophysiological insomnia - using alprazolam sparingly with good results - ALPRAZolam (XANAX) 0.5 MG tablet; Take 0.5 tablets (0.25 mg total) by mouth at bedtime as needed for anxiety.  Dispense: 15 tablet; Refill: 0  3. Neurodermatitis - good results with diprolene. Continue follow up with dermatology.  4. Vitamin D deficiency - results reviewed with patient and she has already started vitamin D3 supplement  5. Gastroesophageal reflux disease, unspecified whether esophagitis present - using prn, discussed weaning, lower dose sent to patient's pharmacy - pantoprazole (PROTONIX) 20 MG tablet; Take 1 tablet (20 mg total) by mouth daily.  Dispense: 30 tablet; Refill: 2  - follow up in 6 months  This visit occurred during the SARS-CoV-2 public health emergency.  Safety protocols were in place, including screening questions prior to the visit, additional usage of staff PPE, and extensive cleaning of exam room while observing appropriate contact time as indicated for disinfecting solutions.    Clarene Reamer, FNP-BC  [facility] [place] [facility]  06/29/2020 7:40 AM

## 2020-06-28 NOTE — Patient Instructions (Signed)
I have sent in a prescription for a lower dose pantoprazole 20 mg, let's try to wean as much as we can  I will notify you of lab results

## 2020-06-29 MED ORDER — ALPRAZOLAM 0.5 MG PO TABS: 0.2500 mg | ORAL_TABLET | Freq: Every evening | ORAL | 0 refills | Status: DC | PRN

## 2020-08-23 NOTE — Progress Notes (Signed)
Cardiology Office Note  Date:  08/26/2020   ID:  Barbara, Garcia 13-Mar-1941, MRN [id number]  PCP:  Elby Beck, FNP   Chief Complaint  Patient presents with   OTHER    No complaints today. Meds reviewed verbally with pt.    HPI:  [name] is a 79 year old woman with past medical history of GERD anxiety Mitral valve prolapse Previous chest pain Coronary calcification on CT scan Who presents for f/u of her coronary artery disease  Gets out of the house, active No exercise Rare woozy spells, when standing/sitting Dates back years, 7 years  Stress with husband at home Place him at home, he is an active with health issues  Rare high pressure  Possibly stress related  No diabetes No smoking  EKG personally reviewed by myself on todays visit NSR rate 78 no ST or T wave changes, PAC  Other past medical hx In 07/2015 Prior stress test, carotid ultrasound, abdominal aortic ultrasound , CT coronary calcium score No significant carotid disease as detailed below, normal stress test, aorta was normal Very low calcium score 39  In 2015 She has been maintained on low-dose Lipitor When she takes the medication on a regular basis total cholesterol usually relatively well controlled Most recently total cholesterol 160 It has been as low as 140 Baseline cholesterol without any medication is typically 230s  Testing detailed below Prior stress test September 2016 NSR at rest.  7:35 Bruce; 117% MPHR.  No exertional chest pain. Normal BP response.  1-1.77mm upsloping ST depression laterally at peak, transient/brief; normal in early recovery.  Symptomatically negative, electrically equivocal ECG treadmill test.  Carotid ultrasound Normal July 2015  CT coronary calcium score 39 in the LAD February 2015  ultrasound aorta normal study abdominal 1/ 2015  Stress test 2011 Carotid ultrasound 2010, no stenosis   PMH:   has a past medical history of  Allergy, Anxiety, Arthritis, Benign essential tremor, Chronic dermatitis, Colon polyps, Combined form of senile cataract, Constipation, GERD (gastroesophageal reflux disease), Hearing loss, Hemorrhoids, History of cystocele, History of shingles, Hyperglycemia, Hyperlipidemia, Hyperopia with astigmatism and presbyopia, Hypertension, Iris nevus, MVP (mitral valve prolapse), Osteopenia, Osteoporosis, Posterior vitreous detachment, bilateral, Retinal drusen, bilateral, Sjogrens syndrome ([place]), and Vitamin D deficiency.  PSH:    Past Surgical History:  Procedure Laterality Date   ABDOMINAL HYSTERECTOMY     BOTOX INJECTION N/A 10/11/2018   Procedure: BOTOX INJECTION;  Surgeon: Jules Husbands, MD;  Location: [facility] ORS;  Service: General;  Laterality: N/A;   COLON SURGERY     hemmhoirdectomy with abcess fistula   COLONOSCOPY W/ BIOPSIES  06/18/2005   COLONOSCOPY WITH PROPOFOL N/A 08/03/2019   Procedure: COLONOSCOPY WITH PROPOFOL;  Surgeon: Lin Landsman, MD;  Location: [facility] ENDOSCOPY;  Service: Gastroenterology;  Laterality: N/A;   ESOPHAGOGASTRODUODENOSCOPY (EGD) WITH PROPOFOL N/A 08/03/2019   Procedure: ESOPHAGOGASTRODUODENOSCOPY (EGD) WITH PROPOFOL;  Surgeon: Lin Landsman, MD;  Location: [place];  Service: Gastroenterology;  Laterality: N/A;   ORIF FINGER / THUMB FRACTURE Right    pin removed later   TONSILLECTOMY      Current Outpatient Medications  Medication Sig Dispense Refill   albuterol (VENTOLIN HFA) 108 (90 Base) MCG/ACT inhaler Inhale 2 puffs into the lungs every 6 (six) hours as needed for wheezing or shortness of breath. 18 g 0   ALPRAZolam (XANAX) 0.5 MG tablet Take 0.5 tablets (0.25 mg total) by mouth at bedtime as needed for anxiety. 15 tablet 0   augmented  betamethasone dipropionate (DIPROLENE-AF) 0.05 % ointment Apply topically 2 (two) times daily.     Bioflavonoid Products (SUPER-C 1000 PO) Take 1 tablet by mouth daily.     Cholecalciferol (VITAMIN  D3) 125 MCG (5000 UT) CAPS Take by mouth.     escitalopram (LEXAPRO) 5 MG tablet Take 1 tablet (5 mg total) by mouth daily. 14 tablet 0   Magnesium Citrate 200 MG TABS Take 200 mg by mouth daily.     pantoprazole (PROTONIX) 20 MG tablet Take 1 tablet (20 mg total) by mouth daily. 30 tablet 2   Polyvinyl Alcohol-Povidone PF (REFRESH) 1.4-0.6 % SOLN Place 1 drop into both eyes daily as needed (for dry eyes).     propranolol (INDERAL) 10 MG tablet Take 1 tablet (10 mg total) by mouth 3 (three) times daily as needed. 270 tablet 2   No current facility-administered medications for this visit.     Allergies:   Lisinopril   Social History:  The patient  reports that she has never smoked. She has never used smokeless tobacco. She reports that she does not drink alcohol and does not use drugs.   Family History:   family history includes Arthritis in her sister; Cancer in her mother; Heart Problems in her brother, brother, brother, brother, sister, and sister; Heart attack in her father; Heart disease in her father, mother, and sister; Heart failure in her mother; Hyperlipidemia in her father; Hypertension in her father, mother, sister, and sister; Kidney disease in her sister; Stroke in her mother and sister.    Review of Systems: Review of Systems  Constitutional: Negative.   HENT: Negative.   Respiratory: Negative.   Cardiovascular: Positive for chest pain.  Gastrointestinal: Negative.   Musculoskeletal: Negative.   Neurological: Negative.   Psychiatric/Behavioral: Negative.   All other systems reviewed and are negative.   PHYSICAL EXAM: VS:  BP 130/70 (BP Location: Left Arm, Patient Position: Sitting, Cuff Size: Normal)    Pulse 78    Ht 5\' 5"  (1.651 m)    Wt 134 lb 6 oz (61 kg)    BMI 22.36 kg/m  , BMI Body mass index is 22.36 kg/m. Constitutional:  oriented to person, place, and time. No distress.  HENT:  Head: Grossly normal Eyes:  no discharge. No scleral icterus.  Neck: No  JVD, no carotid bruits  Cardiovascular: Regular rate and rhythm, no murmurs appreciated Pulmonary/Chest: Clear to auscultation bilaterally, no wheezes or rails Abdominal: Soft.  no distension.  no tenderness.  Musculoskeletal: Normal range of motion Neurological:  normal muscle tone. Coordination normal. No atrophy Skin: Skin warm and dry Psychiatric: normal affect, pleasant   Recent Labs: 10/03/2019: ALT 19; Magnesium 2.4 06/24/2020: Hemoglobin 12.2; Platelets 184.0 06/28/2020: BUN 21; Creatinine, Ser 0.86; Potassium 4.5; Sodium 139    Lipid Panel Lab Results  Component Value Date   CHOL 227 (H) 06/24/2020   HDL 60.30 06/24/2020   LDLCALC 147 (H) 06/24/2020   TRIG 102.0 06/24/2020      Wt Readings from Last 3 Encounters:  08/26/20 134 lb 6 oz (61 kg)  06/28/20 133 lb (60.3 kg)  12/29/19 134 lb 1.9 oz (60.8 kg)      ASSESSMENT AND PLAN:  Chest pain No sx, no further workup at this time  Shortness of breath Recommended walking program  Coronary artery calcification seen on CAT scan - Plan: EKG 12-Lead Minimal coronary calcification noted 5 years ago She does not want cholesterol medication  Mixed hyperlipidemia Continue Lipitor Number stable  Hypertension goal BP (blood pressure) < 130/80 - Plan: EKG 12-Lead Blood pressure is well controlled on today's visit. No changes made to the medications. Rare spikes  MVP (mitral valve prolapse) No significant murmur appreciated on exam stable   Total encounter time more than 25 minutes  Greater than 50% was spent in counseling and coordination of care with the patient    No orders of the defined types were placed in this encounter.    Signed, Esmond Plants, M.D., Ph.D. 08/26/2020  [place], [place]

## 2020-08-26 ENCOUNTER — Other Ambulatory Visit: Payer: Self-pay

## 2020-08-26 ENCOUNTER — Ambulatory Visit (INDEPENDENT_AMBULATORY_CARE_PROVIDER_SITE_OTHER): Payer: Medicare Other | Admitting: Cardiovascular Disease

## 2020-08-26 ENCOUNTER — Encounter: Payer: Self-pay | Admitting: Cardiovascular Disease

## 2020-08-26 VITALS — BP 130/70 | HR 78 | Ht 65.0 in | Wt 134.4 lb

## 2020-08-26 DIAGNOSIS — I2510 Atherosclerotic heart disease of native coronary artery without angina pectoris: Secondary | ICD-10-CM

## 2020-08-26 DIAGNOSIS — I341 Nonrheumatic mitral (valve) prolapse: Secondary | ICD-10-CM | POA: Diagnosis not present

## 2020-08-26 DIAGNOSIS — I10 Essential (primary) hypertension: Secondary | ICD-10-CM

## 2020-08-26 DIAGNOSIS — E782 Mixed hyperlipidemia: Secondary | ICD-10-CM

## 2020-08-26 NOTE — Patient Instructions (Signed)
Medication Instructions:  No changes  If you need a refill on your cardiac medications before your next appointment, please call your pharmacy.    Lab work: No new labs needed   If you have labs (blood work) drawn today and your tests are completely normal, you will receive your results only by: . MyChart Message (if you have MyChart) OR . A paper copy in the mail If you have any lab test that is abnormal or we need to change your treatment, we will call you to review the results.   Testing/Procedures: No new testing needed   Follow-Up: At [facility], you and your health needs are our priority.  As part of our continuing mission to provide you with exceptional heart care, we have created designated Provider Care Teams.  These Care Teams include your primary Cardiologist (physician) and Advanced Practice Providers (APPs -  Physician Assistants and Nurse Practitioners) who all work together to provide you with the care you need, when you need it.  . You will need a follow up appointment as needed  . Providers on your designated Care Team:   . Christopher Berge, NP . Ryan Dunn, PA-C . Jacquelyn Visser, PA-C  Any Other Special Instructions Will Be Listed Below (If Applicable).  COVID-19 Vaccine Information can be found at: [email or web address] For questions related to vaccine distribution or appointments, please email [email or web address] or call [phone].     

## 2020-09-07 ENCOUNTER — Other Ambulatory Visit: Payer: Self-pay

## 2020-09-07 ENCOUNTER — Ambulatory Visit (INDEPENDENT_AMBULATORY_CARE_PROVIDER_SITE_OTHER): Payer: Medicare Other

## 2020-09-07 DIAGNOSIS — Z23 Encounter for immunization: Secondary | ICD-10-CM

## 2020-10-03 ENCOUNTER — Ambulatory Visit: Payer: Medicare Other

## 2020-12-03 DIAGNOSIS — Z23 Encounter for immunization: Secondary | ICD-10-CM | POA: Diagnosis not present

## 2021-01-01 ENCOUNTER — Ambulatory Visit: Payer: Medicare Other | Admitting: Family Medicine

## 2021-01-10 DIAGNOSIS — E611 Iron deficiency: Secondary | ICD-10-CM | POA: Diagnosis not present

## 2021-01-10 DIAGNOSIS — I2510 Atherosclerotic heart disease of native coronary artery without angina pectoris: Secondary | ICD-10-CM | POA: Diagnosis not present

## 2021-01-10 DIAGNOSIS — E559 Vitamin D deficiency, unspecified: Secondary | ICD-10-CM | POA: Diagnosis not present

## 2021-01-10 DIAGNOSIS — G4700 Insomnia, unspecified: Secondary | ICD-10-CM | POA: Diagnosis not present

## 2021-01-10 DIAGNOSIS — I10 Essential (primary) hypertension: Secondary | ICD-10-CM | POA: Diagnosis not present

## 2021-01-10 DIAGNOSIS — F419 Anxiety disorder, unspecified: Secondary | ICD-10-CM | POA: Diagnosis not present

## 2021-01-21 DIAGNOSIS — H43813 Vitreous degeneration, bilateral: Secondary | ICD-10-CM | POA: Diagnosis not present

## 2021-01-21 DIAGNOSIS — D3142 Benign neoplasm of left ciliary body: Secondary | ICD-10-CM | POA: Diagnosis not present

## 2021-01-21 DIAGNOSIS — H35371 Puckering of macula, right eye: Secondary | ICD-10-CM | POA: Diagnosis not present

## 2021-01-21 DIAGNOSIS — H25813 Combined forms of age-related cataract, bilateral: Secondary | ICD-10-CM | POA: Diagnosis not present

## 2021-03-11 ENCOUNTER — Encounter: Payer: Self-pay | Admitting: Family

## 2021-03-11 ENCOUNTER — Ambulatory Visit (INDEPENDENT_AMBULATORY_CARE_PROVIDER_SITE_OTHER): Payer: Medicare Other

## 2021-03-11 ENCOUNTER — Ambulatory Visit (INDEPENDENT_AMBULATORY_CARE_PROVIDER_SITE_OTHER): Payer: Medicare Other | Admitting: Family

## 2021-03-11 ENCOUNTER — Other Ambulatory Visit: Payer: Self-pay

## 2021-03-11 VITALS — BP 144/78 | HR 83 | Ht 65.0 in | Wt 135.0 lb

## 2021-03-11 DIAGNOSIS — R0789 Other chest pain: Secondary | ICD-10-CM

## 2021-03-11 DIAGNOSIS — E785 Hyperlipidemia, unspecified: Secondary | ICD-10-CM | POA: Diagnosis not present

## 2021-03-11 DIAGNOSIS — I2510 Atherosclerotic heart disease of native coronary artery without angina pectoris: Secondary | ICD-10-CM | POA: Diagnosis not present

## 2021-03-11 DIAGNOSIS — R002 Palpitations: Secondary | ICD-10-CM

## 2021-03-11 DIAGNOSIS — I10 Essential (primary) hypertension: Secondary | ICD-10-CM | POA: Diagnosis not present

## 2021-03-11 DIAGNOSIS — R0609 Other forms of dyspnea: Secondary | ICD-10-CM

## 2021-03-11 DIAGNOSIS — R0600 Dyspnea, unspecified: Secondary | ICD-10-CM

## 2021-03-11 MED ORDER — ATORVASTATIN CALCIUM 10 MG PO TABS: 10.0000 mg | ORAL_TABLET | Freq: Every day | ORAL | 0 refills | Status: DC

## 2021-03-11 MED ORDER — METOPROLOL SUCCINATE ER 25 MG PO TB24: 25.0000 mg | ORAL_TABLET | Freq: Every day | ORAL | 0 refills | Status: DC

## 2021-03-11 MED ORDER — NITROGLYCERIN 0.4 MG SL SUBL: 0.4000 mg | SUBLINGUAL_TABLET | SUBLINGUAL | 2 refills | Status: DC | PRN

## 2021-03-11 MED ORDER — METOPROLOL SUCCINATE ER 25 MG PO TB24: 25.0000 mg | ORAL_TABLET | Freq: Every day | ORAL | 3 refills | Status: DC

## 2021-03-11 MED ORDER — ATORVASTATIN CALCIUM 10 MG PO TABS: 10.0000 mg | ORAL_TABLET | Freq: Every day | ORAL | 3 refills | Status: DC

## 2021-03-11 NOTE — Addendum Note (Signed)
Addended by: Valora Corporal on: 03/11/2021 11:13 AM   Modules accepted: Orders

## 2021-03-11 NOTE — Progress Notes (Signed)
Office Visit    Patient Name: Barbara Garcia Date of Encounter: 03/11/2021  PCP:  Elby Beck, FNP (Inactive)   [place]  Cardiologist:  Ida Rogue, MD  Advanced Practice Provider:  No care team member to display Electrophysiologist:  None   Chief Complaint    Barbara Garcia is a 80 y.o. female with a hx of GERD, anxiety, mitral valve prolapse, coronary artery calcification on CT, HLD, insomnia, RLS, anxiety presents today for chest pain   Past Medical History    Past Medical History:  Diagnosis Date  . Allergy   . Anxiety   . Arthritis   . Benign essential tremor   . Chronic dermatitis   . Colon polyps   . Combined form of senile cataract   . Constipation   . GERD (gastroesophageal reflux disease)   . Hearing loss   . Hemorrhoids   . History of cystocele   . History of shingles   . Hyperglycemia   . Hyperlipidemia   . Hyperopia with astigmatism and presbyopia   . Hypertension   . Iris nevus   . MVP (mitral valve prolapse)   . Osteopenia   . Osteoporosis   . Posterior vitreous detachment, bilateral   . Retinal drusen, bilateral   . Sjogrens syndrome ([place])   . Vitamin D deficiency    Past Surgical History:  Procedure Laterality Date  . ABDOMINAL HYSTERECTOMY    . BOTOX INJECTION N/A 10/11/2018   Procedure: BOTOX INJECTION;  Surgeon: Jules Husbands, MD;  Location: [facility] ORS;  Service: General;  Laterality: N/A;  . COLON SURGERY     hemmhoirdectomy with abcess fistula  . COLONOSCOPY W/ BIOPSIES  06/18/2005  . COLONOSCOPY WITH PROPOFOL N/A 08/03/2019   Procedure: COLONOSCOPY WITH PROPOFOL;  Surgeon: Lin Landsman, MD;  Location: [facility] ENDOSCOPY;  Service: Gastroenterology;  Laterality: N/A;  . ESOPHAGOGASTRODUODENOSCOPY (EGD) WITH PROPOFOL N/A 08/03/2019   Procedure: ESOPHAGOGASTRODUODENOSCOPY (EGD) WITH PROPOFOL;  Surgeon: Lin Landsman, MD;  Location: [facility] ENDOSCOPY;  Service: Gastroenterology;   Laterality: N/A;  . ORIF FINGER / THUMB FRACTURE Right    pin removed later  . TONSILLECTOMY      Allergies  Allergies  Allergen Reactions  . Lisinopril Cough    History of Present Illness    Barbara Garcia is a 80 y.o. female with a hx of GERD, anxiety, mitral valve prolapse, coronary artery calcification on CT, HLD, insomnia, RLS, anxiety last seen 08/26/20 by Dr. Rockey Situ.  Previous coronary calcium score in 2015 of 39. Ultrasound of the abdominal aorta 11/2013 was normal. Carotid duplex July 2015 with no evidence of carotid artery stenosis. Treadmill stress test 2016 exercised for 7:35 on Bruce protocol with no exertional chest pain - noted 1-1.64mm upsloping ST depression laterally at peak noted as electrically equivocal ECG treadmill test.  Previous intolerance to lisinopril with cough.  She was last seen by Dr. Rockey Situ 08/26/2020 doing overall well from a cardiac perspective with no recurrent chest pain.  No changes were made at that time.  She sent a MyChart message earlier in the week noting 2 episodes of chest discomfort radiating up into the jaw that relieved with aspirin.  She was advised to schedule follow-up.  She presents today for follow-up. Tells me when she saw Dr. Rockey Situ in October she noted an occasional chest discomfort and was told to contact our office if she wanted a heart monitor. Last week she noted a midsternal chest discomfort  while laying in bed. Describes it as a "gripping" tight pain which travels up her chest into the right side of her jaw. She takes an 81mg  aspirin which resolves the discomfort.  Tells me her heart rate at home is 75bon - 105bpm. Notices her heart going "bam, bam, bam" when she firs tlays down at night. She notes no shortness of breath at rest. Does endorse dyspnea on exertion which she attributes to not exercising. Previously walked and went to the [facility] She has been monitoring her BP at home with routinely readings 140s-167/75-80s. Reports  only lightheadedness with quick position changes but no routine near-syncope nor syncope. Takes Protonix 2-3 times per week for indigestion which she tells me feels different than her present chest discomfort. She has not been taking cholesterol medication for 5 years per her report.  We reviewed the importance of cholesterol medication in the setting of coronary artery calcification.  EKGs/Labs/Other Studies Reviewed:   The following studies were reviewed today:  EKG:  EKG is ordered today.  The ekg ordered today demonstrates 83 bpm with no acute ST/T wave changes.  Recent Labs: 06/24/2020: Hemoglobin 12.2; Platelets 184.0 06/28/2020: BUN 21; Creatinine, Ser 0.86; Potassium 4.5; Sodium 139  Recent Lipid Panel    Component Value Date/Time   CHOL 227 (H) 06/24/2020 1003   TRIG 102.0 06/24/2020 1003   HDL 60.30 06/24/2020 1003   CHOLHDL 4 06/24/2020 1003   VLDL 20.4 06/24/2020 1003   LDLCALC 147 (H) 06/24/2020 1003   Home Medications   No outpatient medications have been marked as taking for the 03/11/21 encounter (Appointment) with Loel Dubonnet, NP.     Review of Systems  All other systems reviewed and are otherwise negative except as noted above.  Physical Exam    VS:  There were no vitals taken for this visit. , BMI There is no height or weight on file to calculate BMI.  Wt Readings from Last 3 Encounters:  08/26/20 134 lb 6 oz (61 kg)  06/28/20 133 lb (60.3 kg)  12/29/19 134 lb 1.9 oz (60.8 kg)    GEN: Well nourished, well developed, in no acute distress. HEENT: normal. Neck: Supple, no JVD, carotid bruits, or masses. Cardiac: RRR, no murmurs, rubs, or gallops. No clubbing, cyanosis, edema.  Radials/PT 2+ and equal bilaterally.  Respiratory:  Respirations regular and unlabored, clear to auscultation bilaterally. GI: Soft, nontender, nondistended. MS: No deformity or atrophy. Skin: Warm and dry, no rash. Neuro:  Strength and sensation are intact. Psych: Normal  affect.  Assessment & Plan    1. Coronary artery calcification on CT scan / Atypical chest pain / Palpitations / Dyspnea on exertion -2015 coronary calcium score 39.  Reports occasional episode of midsternal chest discomfort which radiates up to her right neck and occurs at rest.  EKG today normal sinus rhythm with no acute ST/T changes.  Discomfort atypical for angina.  She does note dyspnea on exertion which she attributes to being relatively inactive over the last few months.  She also notes sensation of her heart pounding forcefully when she lays down to sleep at night.  Likely etiology PVC.  14-day ZIO monitor to rule out significant arrhythmia placed today in clinic.  We will obtain echocardiogram to assess her mitral valve prolapse as well as wall motion abnormalities.  Start metoprolol succinate 25 mg daily for palpitations.  No indication for ischemic evaluation at this time though if she has persistent or worsening chest pain cardiac CT versus stress  test could be considered.  2. Hyperlipidemia- LDL goal less than 70 in setting prior to calcification.  Most recent lipid panel 06/2020 total cholesterol 227, LDL 147.  She has been off of her medication for 5 years.  Long discussion regarding the importance of optimal cholesterol management in the setting of coronary artery calcification.  She was agreeable to resume atorvastatin 10 mg daily.  Plan for repeat lipid/LFT at follow-up in 6 weeks and consideration of elevated dose as needed.  3. Hypertension- BP elevated on home monitoring as well as in clinic.  She is overall hesitant regarding additional medications.  Assess response to metoprolol succinate 25 mg daily initiated, as above.  Previous intolerance to lisinopril with cough.  4. Mitral valve prolapse- update echocardiogram.  Disposition: Follow up in 6 week(s) with Dr. Rockey Situ or APP   Signed, Loel Dubonnet, NP 03/11/2021, 8:35 AM [place]

## 2021-03-11 NOTE — Patient Instructions (Addendum)
Medication Instructions:  Your physician has recommended you make the following change in your medication:   START Atorvastatin (Lipitor) 10mg  daily  START Metoprolol Succinate (Toprol) 25mg  once daily  *If you need a refill on your cardiac medications before your next appointment, please call your pharmacy*  Lab Work: None ordered today  Testing/Procedures: Your physician has requested that you have an echocardiogram. Echocardiography is a painless test that uses sound waves to create images of your heart. It provides your doctor with information about the size and shape of your heart and how well your heart's chambers and valves are working. This procedure takes approximately one hour. There are no restrictions for this procedure.  Your physician has recommended that you wear a Zio monitor.   This monitor is a medical device that records the heart's electrical activity. Doctors most often use these monitors to diagnose arrhythmias. Arrhythmias are problems with the speed or rhythm of the heartbeat. The monitor is a small device applied to your chest. You can wear one while you do your normal daily activities. While wearing this monitor if you have any symptoms to push the button and record what you felt. Once you have worn this monitor for the period of time provider prescribed (Usually 14 days), you will return the monitor device in the postage paid box. Once it is returned they will download the data collected and provide [place] with a report which the provider will then review and we will call you with those results. Important tips:  1. Avoid showering during the first 24 hours of wearing the monitor. 2. Avoid excessive sweating to help maximize wear time. 3. Do not submerge the device, no hot tubs, and no swimming pools. 4. Keep any lotions or oils away from the patch. 5. After 24 hours you may shower with the patch on. Take brief showers with your back facing the shower head.  6. Do not  remove patch once it has been placed because that will interrupt data and decrease adhesive wear time. 7. Push the button when you have any symptoms and write down what you were feeling. 8. Once you have completed wearing your monitor, remove and place into box which has postage paid and place in your outgoing mailbox.  9. If for some reason you have misplaced your box then call our office and we can provide another box and/or mail it off for you.  Follow-Up: At [facility], you and your health needs are our priority.  As part of our continuing mission to provide you with exceptional heart care, we have created designated Provider Care Teams.  These Care Teams include your primary Cardiologist (physician) and Advanced Practice Providers (APPs -  Physician Assistants and Nurse Practitioners) who all work together to provide you with the care you need, when you need it.   Your next appointment:   6 weeks  The format for your next appointment:   In Person  Provider:   You may see Ida Rogue, MD or one of the following Advanced Practice Providers on your designated Care Team:    Murray Hodgkins, NP  Christell Faith, PA-C  Marrianne Mood, PA-C  Cadence Kathlen Mody, [place]  Laurann Montana, NP  Other Instructions  Heart Healthy Diet Recommendations: A low-salt diet is recommended. Meats should be grilled, baked, or boiled. Avoid fried foods. Focus on lean protein sources like fish or chicken with vegetables and fruits. The American Heart Association is a Microbiologist!  American Heart Association Diet and  Lifeystyle Recommendations   Exercise recommendations: The American Heart Association recommends 150 minutes of moderate intensity exercise weekly. Try 30 minutes of moderate intensity exercise 4-5 times per week. This could include walking, jogging, or swimming.  Tips to Measure your Blood Pressure Correctly Here's what you can do to ensure a correct reading: . Don't drink a  caffeinated beverage or smoke during the 30 minutes before the test. . Sit quietly for five minutes before the test begins. . During the measurement, sit in a chair with your feet on the floor and your arm supported so your elbow is at about heart level. . The inflatable part of the cuff should completely cover at least 80% of your upper arm, and the cuff should be placed on bare skin, not over a shirt. . Don't talk during the measurement. . Have your blood pressure measured twice, with a brief break in between. If the readings are different by 5 points or more, have it done a third time.   Blood pressure categories  Blood pressure category SYSTOLIC (upper number)  DIASTOLIC (lower number)  Normal Less than 120 mm Hg and Less than 80 mm Hg  Elevated 120-129 mm Hg and Less than 80 mm Hg  High blood pressure: Stage 1 hypertension 130-139 mm Hg or 80-89 mm Hg  High blood pressure: Stage 2 hypertension 140 mm Hg or higher or 90 mm Hg or higher  Hypertensive crisis (consult your doctor immediately) Higher than 180 mm Hg and/or Higher than 120 mm Hg  Source: American Heart Association and American Stroke Association. For more on getting your blood pressure under control, buy Controlling Your Blood Pressure, a Special Health Report from [facility].   Blood Pressure Log Check your blood pressure once per day and keep a log. Please bring to your next appointment.    Date   Time  Blood Pressure  Position  Example: Nov 1 9 AM 124/78 sitting

## 2021-03-12 NOTE — Addendum Note (Signed)
Addended by: Loel Dubonnet on: 03/12/2021 09:30 AM   Modules accepted: Orders

## 2021-03-28 DIAGNOSIS — H2513 Age-related nuclear cataract, bilateral: Secondary | ICD-10-CM | POA: Diagnosis not present

## 2021-03-31 DIAGNOSIS — R002 Palpitations: Secondary | ICD-10-CM | POA: Diagnosis not present

## 2021-04-04 ENCOUNTER — Other Ambulatory Visit: Payer: Self-pay | Admitting: Family

## 2021-04-04 DIAGNOSIS — E785 Hyperlipidemia, unspecified: Secondary | ICD-10-CM

## 2021-04-04 DIAGNOSIS — R002 Palpitations: Secondary | ICD-10-CM

## 2021-04-04 DIAGNOSIS — R0609 Other forms of dyspnea: Secondary | ICD-10-CM

## 2021-04-04 DIAGNOSIS — I2510 Atherosclerotic heart disease of native coronary artery without angina pectoris: Secondary | ICD-10-CM

## 2021-04-09 ENCOUNTER — Other Ambulatory Visit: Payer: Self-pay | Admitting: Family

## 2021-04-09 DIAGNOSIS — R0600 Dyspnea, unspecified: Secondary | ICD-10-CM

## 2021-04-09 DIAGNOSIS — R0609 Other forms of dyspnea: Secondary | ICD-10-CM

## 2021-04-10 ENCOUNTER — Ambulatory Visit (INDEPENDENT_AMBULATORY_CARE_PROVIDER_SITE_OTHER): Payer: Medicare Other

## 2021-04-10 ENCOUNTER — Other Ambulatory Visit: Payer: Self-pay

## 2021-04-10 DIAGNOSIS — R0600 Dyspnea, unspecified: Secondary | ICD-10-CM | POA: Diagnosis not present

## 2021-04-10 DIAGNOSIS — R0609 Other forms of dyspnea: Secondary | ICD-10-CM

## 2021-04-10 LAB — ECHOCARDIOGRAM COMPLETE
AR max vel: 1.96 cm2
AV Area VTI: 1.89 cm2
AV Area mean vel: 1.85 cm2
AV Mean grad: 3 mmHg
AV Peak grad: 5.9 mmHg
Ao pk vel: 1.21 m/s
Area-P 1/2: 4.33 cm2
Calc EF: 62.3 %
S' Lateral: 2.6 cm
Single Plane A2C EF: 64.4 %
Single Plane A4C EF: 56.4 %

## 2021-04-24 ENCOUNTER — Encounter: Payer: Self-pay | Admitting: Cardiovascular Disease

## 2021-04-24 ENCOUNTER — Ambulatory Visit (INDEPENDENT_AMBULATORY_CARE_PROVIDER_SITE_OTHER): Payer: Medicare Other | Admitting: Cardiovascular Disease

## 2021-04-24 ENCOUNTER — Other Ambulatory Visit: Payer: Self-pay

## 2021-04-24 VITALS — BP 130/60 | HR 78 | Ht 65.0 in | Wt 136.2 lb

## 2021-04-24 DIAGNOSIS — R0789 Other chest pain: Secondary | ICD-10-CM | POA: Diagnosis not present

## 2021-04-24 DIAGNOSIS — I341 Nonrheumatic mitral (valve) prolapse: Secondary | ICD-10-CM

## 2021-04-24 DIAGNOSIS — I2510 Atherosclerotic heart disease of native coronary artery without angina pectoris: Secondary | ICD-10-CM

## 2021-04-24 DIAGNOSIS — R002 Palpitations: Secondary | ICD-10-CM | POA: Diagnosis not present

## 2021-04-24 DIAGNOSIS — E785 Hyperlipidemia, unspecified: Secondary | ICD-10-CM | POA: Diagnosis not present

## 2021-04-24 DIAGNOSIS — I10 Essential (primary) hypertension: Secondary | ICD-10-CM

## 2021-04-24 NOTE — Patient Instructions (Signed)
Medication Instructions:  No changes  If you need a refill on your cardiac medications before your next appointment, please call your pharmacy.    Lab work: No new labs needed   If you have labs (blood work) drawn today and your tests are completely normal, you will receive your results only by: . MyChart Message (if you have MyChart) OR . A paper copy in the mail If you have any lab test that is abnormal or we need to change your treatment, we will call you to review the results.   Testing/Procedures: No new testing needed   Follow-Up: At [facility], you and your health needs are our priority.  As part of our continuing mission to provide you with exceptional heart care, we have created designated Provider Care Teams.  These Care Teams include your primary Cardiologist (physician) and Advanced Practice Providers (APPs -  Physician Assistants and Nurse Practitioners) who all work together to provide you with the care you need, when you need it.  . You will need a follow up appointment in 12 months  . Providers on your designated Care Team:   . Christopher Berge, NP . Ryan Dunn, PA-C . Jacquelyn Visser, PA-C  Any Other Special Instructions Will Be Listed Below (If Applicable).  COVID-19 Vaccine Information can be found at: [email or web address] For questions related to vaccine distribution or appointments, please email [email or web address] or call [phone].     

## 2021-04-24 NOTE — Progress Notes (Signed)
Cardiology Office Note  Date:  04/24/2021   ID:  Barbara, Garcia June 17, 1941, MRN [id number]  PCP:  Leonel Ramsay, MD   Chief Complaint  Patient presents with  . 6 week follow up     Discuss Echo & Zio monitor results. Medications reviewed by the patient verbally.     HPI:  [name] is a 80 year old woman with past medical history of GERD anxiety Mitral valve prolapse Previous chest pain Coronary calcification on CT scan Who presents for f/u of her coronary artery disease  Palpitations better on metoprolol Sleeping better , not needing trazodone Active Not need prorpanolol Not using NTG  Stress with husband at home Place him at home, he is an active with health issues  echocardiogram, results reviewed 1. Left ventricular ejection fraction, by estimation, is 65 to 70%. The  left ventricle has normal function. The left ventricle has no regional  wall motion abnormalities. Left ventricular diastolic parameters are  consistent with Grade II diastolic dysfunction (pseudonormalization). The average left ventricular global longitudinal strain is -15.2 %.  2. Right ventricular systolic function is normal. The right ventricular  size is normal. There is normal pulmonary artery systolic pressure. The  estimated right ventricular systolic pressure is 62.2 mmHg.  3. The mitral valve is normal in structure. Unable to exclude mild  prolapse, challenging images. Mild mitral valve regurgitation.   Event monitor, reviewed Patient had a min HR of 54 bpm, max HR of 200 bpm, and avg HR of 81 bpm.  Predominant underlying rhythm was Sinus Rhythm.   27 Supraventricular Tachycardia/atrial tachycardia  runs occurred, the run with the fastest interval lasting 4 beats with a max rate of 200 bpm, the longest lasting 14 beats with an avg rate of 121 bpm.  Isolated SVEs were rare (<1.0%), SVE Couplets were rare (<1.0%), and SVE Triplets were rare (<1.0%). Isolated VEs  were rare (<1.0%, 47), VE Couplets were rare (<1.0%, 7), and VE Triplets were rare (<1.0%, 1).   Patient triggered events not associated with significant arrhythmia  No diabetes No smoking  Other past medical hx In 07/2015 Prior stress test, carotid ultrasound, abdominal aortic ultrasound , CT coronary calcium score No significant carotid disease as detailed below, normal stress test, aorta was normal Very low calcium score 39  In 2015 She has been maintained on low-dose Lipitor When she takes the medication on a regular basis total cholesterol usually relatively well controlled Most recently total cholesterol 160 It has been as low as 140 Baseline cholesterol without any medication is typically 230s  Testing detailed below Prior stress test September 2016 NSR at rest.  7:35 Bruce; 117% MPHR.  No exertional chest pain. Normal BP response.  1-1.24mm upsloping ST depression laterally at peak, transient/brief; normal in early recovery.  Symptomatically negative, electrically equivocal ECG treadmill test.  Carotid ultrasound Normal July 2015  CT coronary calcium score 39 in the LAD February 2015  ultrasound aorta normal study abdominal 1/ 2015  Stress test 2011 Carotid ultrasound 2010, no stenosis   PMH:   has a past medical history of Allergy, Anxiety, Arthritis, Benign essential tremor, Chronic dermatitis, Colon polyps, Combined form of senile cataract, Constipation, GERD (gastroesophageal reflux disease), Hearing loss, Hemorrhoids, History of cystocele, History of shingles, Hyperglycemia, Hyperlipidemia, Hyperopia with astigmatism and presbyopia, Hypertension, Iris nevus, MVP (mitral valve prolapse), Osteopenia, Osteoporosis, Posterior vitreous detachment, bilateral, Retinal drusen, bilateral, Sjogrens syndrome ([place]), and Vitamin D deficiency.  PSH:    Past Surgical History:  Procedure Laterality Date  . ABDOMINAL HYSTERECTOMY    . BOTOX INJECTION N/A 10/11/2018    Procedure: BOTOX INJECTION;  Surgeon: Jules Husbands, MD;  Location: [facility] ORS;  Service: General;  Laterality: N/A;  . COLON SURGERY     hemmhoirdectomy with abcess fistula  . COLONOSCOPY W/ BIOPSIES  06/18/2005  . COLONOSCOPY WITH PROPOFOL N/A 08/03/2019   Procedure: COLONOSCOPY WITH PROPOFOL;  Surgeon: Lin Landsman, MD;  Location: [facility] ENDOSCOPY;  Service: Gastroenterology;  Laterality: N/A;  . ESOPHAGOGASTRODUODENOSCOPY (EGD) WITH PROPOFOL N/A 08/03/2019   Procedure: ESOPHAGOGASTRODUODENOSCOPY (EGD) WITH PROPOFOL;  Surgeon: Lin Landsman, MD;  [facility] ENDOSCOPY;  Service: Gastroenterology;  Laterality: N/A;  . ORIF FINGER / THUMB FRACTURE Right    pin removed later  . TONSILLECTOMY      Current Outpatient Medications  Medication Sig Dispense Refill  . albuterol (VENTOLIN HFA) 108 (90 Base) MCG/ACT inhaler Inhale 2 puffs into the lungs every 6 (six) hours as needed for wheezing or shortness of breath. 18 g 0  . ALPRAZolam (XANAX) 0.5 MG tablet Take 0.5 tablets (0.25 mg total) by mouth at bedtime as needed for anxiety. 15 tablet 0  . atorvastatin (LIPITOR) 10 MG tablet Take 1 tablet (10 mg total) by mouth daily. 90 tablet 3  . augmented betamethasone dipropionate (DIPROLENE-AF) 0.05 % ointment Apply topically 2 (two) times daily.    Marland Kitchen Bioflavonoid Products (SUPER-C 1000 PO) Take 1 tablet by mouth daily.    . carboxymethylcellulose (REFRESH PLUS) 0.5 % SOLN Apply to eye.    . Cholecalciferol (VITAMIN D3) 125 MCG (5000 UT) CAPS Take by mouth.    . escitalopram (LEXAPRO) 5 MG tablet Take 1 tablet (5 mg total) by mouth daily. 14 tablet 0  . ferrous sulfate 325 (65 FE) MG tablet Take 325 mg by mouth every morning.    . fexofenadine-pseudoephedrine (ALLEGRA-D) 60-120 MG 12 hr tablet Take 1 tablet by mouth 2 (two) times daily.    . magnesium oxide (MAG-OX) 400 MG tablet Take 400 mg by mouth daily.    . metoprolol succinate (TOPROL XL) 25 MG 24 hr tablet Take 1 tablet (25 mg  total) by mouth daily. 90 tablet 3  . nitroGLYCERIN (NITROSTAT) 0.4 MG SL tablet Place 1 tablet (0.4 mg total) under the tongue every 5 (five) minutes as needed for chest pain. 25 tablet 2  . pantoprazole (PROTONIX) 20 MG tablet Take 1 tablet (20 mg total) by mouth daily. 30 tablet 2  . Polyvinyl Alcohol-Povidone PF 1.4-0.6 % SOLN Place 1 drop into both eyes daily as needed (for dry eyes).    . propranolol (INDERAL) 10 MG tablet Take 1 tablet (10 mg total) by mouth 3 (three) times daily as needed. 270 tablet 2  . Vitamin D, Ergocalciferol, (DRISDOL) 1.25 MG (50000 UNIT) CAPS capsule      No current facility-administered medications for this visit.     Allergies:   Lisinopril   Social History:  The patient  reports that she has never smoked. She has never used smokeless tobacco. She reports that she does not drink alcohol and does not use drugs.   Family History:   family history includes Arthritis in her sister; Cancer in her mother; Heart Problems in her brother, brother, brother, brother, sister, and sister; Heart attack in her father; Heart disease in her father, mother, and sister; Heart failure in her mother; Hyperlipidemia in her father; Hypertension in her father, mother, sister, and sister; Kidney disease in her sister; Stroke  in her mother and sister.    Review of Systems: Review of Systems  Constitutional: Negative.   HENT: Negative.   Respiratory: Negative.   Cardiovascular: Negative.   Gastrointestinal: Negative.   Musculoskeletal: Negative.   Neurological: Negative.   Psychiatric/Behavioral: Negative.   All other systems reviewed and are negative.   PHYSICAL EXAM: VS:  BP 130/60 (BP Location: Left Arm, Patient Position: Sitting, Cuff Size: Normal)   Pulse 78   Ht 5\' 5"  (1.651 m)   Wt 136 lb 4 oz (61.8 kg)   SpO2 98%   BMI 22.67 kg/m  , BMI Body mass index is 22.67 kg/m. Constitutional:  oriented to person, place, and time. No distress.  HENT:  Head: Grossly  normal Eyes:  no discharge. No scleral icterus.  Neck: No JVD, no carotid bruits  Cardiovascular: Regular rate and rhythm, no murmurs appreciated Pulmonary/Chest: Clear to auscultation bilaterally, no wheezes or rails Abdominal: Soft.  no distension.  no tenderness.  Musculoskeletal: Normal range of motion Neurological:  normal muscle tone. Coordination normal. No atrophy Skin: Skin warm and dry Psychiatric: normal affect, pleasant  Recent Labs: 06/24/2020: Hemoglobin 12.2; Platelets 184.0 06/28/2020: BUN 21; Creatinine, Ser 0.86; Potassium 4.5; Sodium 139    Lipid Panel Lab Results  Component Value Date   CHOL 227 (H) 06/24/2020   HDL 60.30 06/24/2020   LDLCALC 147 (H) 06/24/2020   TRIG 102.0 06/24/2020      Wt Readings from Last 3 Encounters:  04/24/21 136 lb 4 oz (61.8 kg)  03/11/21 135 lb (61.2 kg)  08/26/20 134 lb 6 oz (61 kg)     ASSESSMENT AND PLAN:  Chest pain No sx, no further epsiodes  Shortness of breath Stay active walk  Coronary artery calcification seen on CAT scan - Plan: EKG 12-Lead Minimal coronary calcification noted 5 years ago stable  Mixed hyperlipidemia Continue Lipitor Number stable  Hypertension goal BP (blood pressure) < 130/80 - Plan: EKG 12-Lead Blood pressure stable on metoprolol  MVP (mitral valve prolapse) No significant murmur appreciated on exam  no significant prolapse on exam No further work-up   Total encounter time more than 25 minutes  Greater than 50% was spent in counseling and coordination of care with the patient   No orders of the defined types were placed in this encounter.    Signed, Esmond Plants, M.D., Ph.D. 04/24/2021  [place], [place]

## 2021-05-30 ENCOUNTER — Other Ambulatory Visit: Payer: Self-pay

## 2021-05-30 NOTE — Telephone Encounter (Signed)
Last OV -  06/28/2020 Next OV - N/A Last filled - 06/12/2020

## 2021-06-01 MED ORDER — ESCITALOPRAM OXALATE 5 MG PO TABS: 5.0000 mg | ORAL_TABLET | Freq: Every day | ORAL | 0 refills | Status: DC

## 2021-06-02 ENCOUNTER — Other Ambulatory Visit: Payer: Self-pay

## 2021-06-02 MED ORDER — ESCITALOPRAM OXALATE 5 MG PO TABS: 5.0000 mg | ORAL_TABLET | Freq: Every day | ORAL | 0 refills | Status: DC

## 2021-06-02 NOTE — Telephone Encounter (Signed)
Last OV - 06/28/2020 Next OV - N/A Last filled 06/01/2021  (only 14 tablets) patient requesting 90 tablets.

## 2021-06-16 ENCOUNTER — Other Ambulatory Visit: Payer: Self-pay

## 2021-06-16 MED ORDER — ESCITALOPRAM OXALATE 5 MG PO TABS: 5.0000 mg | ORAL_TABLET | Freq: Every day | ORAL | 0 refills | Status: AC

## 2021-06-16 NOTE — Telephone Encounter (Signed)
LAST OV - 06/28/2020 NEXT OV - Cancelled LAST FILLED - 06/02/2021

## 2021-07-10 DIAGNOSIS — E559 Vitamin D deficiency, unspecified: Secondary | ICD-10-CM | POA: Diagnosis not present

## 2021-07-10 DIAGNOSIS — E785 Hyperlipidemia, unspecified: Secondary | ICD-10-CM | POA: Diagnosis not present

## 2021-07-10 DIAGNOSIS — I10 Essential (primary) hypertension: Secondary | ICD-10-CM | POA: Diagnosis not present

## 2021-07-10 DIAGNOSIS — F5101 Primary insomnia: Secondary | ICD-10-CM | POA: Diagnosis not present

## 2021-07-10 DIAGNOSIS — E611 Iron deficiency: Secondary | ICD-10-CM | POA: Diagnosis not present

## 2021-07-10 DIAGNOSIS — I2510 Atherosclerotic heart disease of native coronary artery without angina pectoris: Secondary | ICD-10-CM | POA: Diagnosis not present

## 2021-08-29 DIAGNOSIS — Z23 Encounter for immunization: Secondary | ICD-10-CM | POA: Diagnosis not present

## 2021-11-04 ENCOUNTER — Telehealth: Payer: Self-pay | Admitting: Cardiovascular Disease

## 2021-11-04 MED ORDER — PROPRANOLOL HCL 10 MG PO TABS: 10.0000 mg | ORAL_TABLET | Freq: Three times a day (TID) | ORAL | 2 refills | Status: DC | PRN

## 2021-11-04 NOTE — Telephone Encounter (Signed)
Was able to return call to Mrs. Yard, she wanted to advise as an FYI only that last night she has a sudden sharp pain in the chest that lasted only a short second. Reports had a busy day yesterday so unsure of that was the reason for the sharp pain.  Pt reports she did take one Nitro and then set on the couch and rested. Took her BP 5 mins after Nitro with reading of 118/62, HR 130. So she then took an expired propanolol 10 mg. Which helped brought her HR down immediately after resting as well. Felt a little"woozy" after the Nitro, advised that can be expected with nitro at times.   Pt reports feels fine today, a little tired, but overall feels normal. Reports BP 127/62, HR 64, no CP, SOB, or other symptoms. Stated she only called as her daughter advised her too, she is not wanting an appt, just as a FYI call.   Resent an order for propanolol as her current order is expired and she has out-dated pills, educated pt that if episode reoccurs, HR keeps elevated over 100 then time to make an appt to be seen, to call back and make an appt. If she develops CP that is not going away, radiates down arm, neck, or jaw then seek the ED for a cardiac work-up.   Mrs. Garate verbalized understanding and thankful for the return call and updated script. Will call back with any further concerns.

## 2021-11-04 NOTE — Telephone Encounter (Signed)
Pt c/o of Chest Pain: STAT if CP now or developed within 24 hours  1. Are you having CP right now? No, just a sharp pain last night, did not last long  2. Are you experiencing any other symptoms (ex. SOB, nausea, vomiting, sweating)? BP was 118/62, HR 130. Felt a little woozy while having the pain.  3. How long have you been experiencing CP? Just a few seconds last night  4. Is your CP continuous or coming and going? It was gone as soon as it came.  5. Have you taken Nitroglycerin? Yes last night also took a Inderal. ?

## 2022-01-13 ENCOUNTER — Other Ambulatory Visit: Payer: Self-pay | Admitting: Infectious Diseases

## 2022-01-13 DIAGNOSIS — Z1231 Encounter for screening mammogram for malignant neoplasm of breast: Secondary | ICD-10-CM

## 2022-02-25 ENCOUNTER — Other Ambulatory Visit: Payer: Self-pay | Admitting: Family

## 2022-03-19 ENCOUNTER — Other Ambulatory Visit: Payer: Self-pay | Admitting: Family

## 2022-05-18 ENCOUNTER — Other Ambulatory Visit: Payer: Self-pay | Admitting: Family

## 2022-06-08 NOTE — Telephone Encounter (Signed)
Attempted to schedule.  

## 2022-06-09 ENCOUNTER — Other Ambulatory Visit: Payer: Self-pay | Admitting: Cardiovascular Disease

## 2022-06-10 ENCOUNTER — Other Ambulatory Visit: Payer: Self-pay | Admitting: Cardiovascular Disease

## 2022-06-22 NOTE — Telephone Encounter (Signed)
Attempted to schedule.  No ans no vm.

## 2022-06-24 ENCOUNTER — Other Ambulatory Visit: Payer: Self-pay | Admitting: Cardiovascular Disease

## 2022-06-25 NOTE — Telephone Encounter (Signed)
Good Morning. Is there any way you could reach out to this patient to schedule an overdue 12 month follow up visit? She last saw Dr. Rockey Situ 04/24/2021. Thank you so much.

## 2022-06-25 NOTE — Telephone Encounter (Signed)
Attempted to schedule.  No ans no vm

## 2022-06-29 ENCOUNTER — Telehealth: Payer: Self-pay | Admitting: Cardiovascular Disease

## 2022-06-29 NOTE — Telephone Encounter (Signed)
3 attempts to schedule fu appt from recall list.   Deleting recall.   

## 2022-06-29 NOTE — Telephone Encounter (Signed)
Attempted to schedule.  No ans no vm.

## 2022-07-14 ENCOUNTER — Ambulatory Visit (INDEPENDENT_AMBULATORY_CARE_PROVIDER_SITE_OTHER): Payer: Medicare Other | Admitting: Cardiology

## 2022-07-14 ENCOUNTER — Encounter: Payer: Self-pay | Admitting: Cardiology

## 2022-07-14 VITALS — BP 120/80 | HR 80 | Ht 65.0 in | Wt 132.0 lb

## 2022-07-14 DIAGNOSIS — I10 Essential (primary) hypertension: Secondary | ICD-10-CM

## 2022-07-14 DIAGNOSIS — I2510 Atherosclerotic heart disease of native coronary artery without angina pectoris: Secondary | ICD-10-CM | POA: Diagnosis not present

## 2022-07-14 DIAGNOSIS — E785 Hyperlipidemia, unspecified: Secondary | ICD-10-CM | POA: Diagnosis not present

## 2022-07-14 DIAGNOSIS — I341 Nonrheumatic mitral (valve) prolapse: Secondary | ICD-10-CM | POA: Diagnosis not present

## 2022-07-14 NOTE — Progress Notes (Signed)
Cardiology Clinic Note   Patient Name: Barbara Garcia Date of Encounter: 07/14/2022  Primary Care Provider:  Leonel Ramsay, MD Primary Cardiologist:  Ida Rogue, MD  Patient Profile    81 year old female with a history of gastroesophageal reflux disease, anxiety, mitral valve prolapse, coronary artery calcification on CT, hyperlipidemia, insomnia, restless leg syndrome, who presents today for follow-up of her MVP.  Past Medical History    Past Medical History:  Diagnosis Date   Allergy    Anxiety    Arthritis    Benign essential tremor    Chronic dermatitis    Colon polyps    Combined form of senile cataract    Constipation    GERD (gastroesophageal reflux disease)    Hearing loss    Hemorrhoids    History of cystocele    History of shingles    Hyperglycemia    Hyperlipidemia    Hyperopia with astigmatism and presbyopia    Hypertension    Iris nevus    MVP (mitral valve prolapse)    Osteopenia    Osteoporosis    Posterior vitreous detachment, bilateral    Retinal drusen, bilateral    Sjogrens syndrome ([facility])    Vitamin D deficiency    Past Surgical History:  Procedure Laterality Date   ABDOMINAL HYSTERECTOMY     BOTOX INJECTION N/A 10/11/2018   Procedure: BOTOX INJECTION;  Surgeon: Jules Husbands, MD;  Location: [facility] ORS;  Service: General;  Laterality: N/A;   COLON SURGERY     hemmhoirdectomy with abcess fistula   COLONOSCOPY W/ BIOPSIES  06/18/2005   COLONOSCOPY WITH PROPOFOL N/A 08/03/2019   Procedure: COLONOSCOPY WITH PROPOFOL;  Surgeon: Lin Landsman, MD;  Location: [facility] ENDOSCOPY;  Service: Gastroenterology;  Laterality: N/A;   ESOPHAGOGASTRODUODENOSCOPY (EGD) WITH PROPOFOL N/A 08/03/2019   Procedure: ESOPHAGOGASTRODUODENOSCOPY (EGD) WITH PROPOFOL;  Surgeon: Lin Landsman, MD;  Location: [place];  Service: Gastroenterology;  Laterality: N/A;   ORIF FINGER / THUMB FRACTURE Right    pin removed later   TONSILLECTOMY       Allergies  Allergies  Allergen Reactions   Lisinopril Cough    History of Present Illness    81 year old female with a history of GERD, anxiety, MVP, coronary artery calcification on CT, hyperlipidemia, insomnia, and RLS.  Previous coronary calcium score in 2015 or 39.  Ultrasound of the abdominal aorta in 11/2013 was normal.  Carotid duplex in July 2015 with no evidence of carotid artery stenosis.  Treadmill stress testing in 2016 she exercised for 7: 35 on Bruce protocol with no exertional chest pain with noted 1-1.5 mm upsloping ST depression laterally at peak noted as an equivocal ECG treadmill test.  Previous intolerance to lisinopril with cough was also noted.  She did wear heart monitor 03/31/2021 which revealed predominant underlying rhythm of sinus rhythm with 27 supraventricular tachycardia/atrial tachycardia runs occurred throughout with the fastest interval lasting 4 beats with a maximum of 200 bpm with the longest lasting 14 beats with an average rate of 121 bpm echocardiogram completed in 04/10/2021 revealed LVEF of 65-70%, no regional wall motion abnormalities, and G2 DD.  Patient was last seen 04/24/2021 by Dr. Rockey Situ and was doing well there were no changes made and no further work-up at that time.  She returns to clinic today stating that she is doing well. Unfortunately her husband passed away 2 months ago, but she appears to be in good spirits today. She denies any chest pain, shortness  of breath, or palpitations. Denies any hospitalizations or visits to the emergency department.  Continues to remain active with walking daily.  Home Medications    Current Outpatient Medications  Medication Sig Dispense Refill   albuterol (VENTOLIN HFA) 108 (90 Base) MCG/ACT inhaler Inhale 2 puffs into the lungs every 6 (six) hours as needed for wheezing or shortness of breath. 18 g 0   ALPRAZolam (XANAX) 0.5 MG tablet Take 0.5 tablets (0.25 mg total) by mouth at bedtime as needed for  anxiety. 15 tablet 0   atorvastatin (LIPITOR) 10 MG tablet Take 1 tablet (10 mg total) by mouth daily. CALL AND SCHEDULE OVERDUE OFFICE VISIT TO RECEIVE FURTHER REFILLS. 2ND ATTEMPT. 15 tablet 0   augmented betamethasone dipropionate (DIPROLENE-AF) 0.05 % ointment Apply topically 2 (two) times daily as needed.     Bioflavonoid Products (SUPER-C 1000 PO) Take 1 tablet by mouth daily.     carboxymethylcellulose (REFRESH PLUS) 0.5 % SOLN Apply to eye daily as needed.     escitalopram (LEXAPRO) 5 MG tablet Take 1 tablet (5 mg total) by mouth daily. 90 tablet 0   fexofenadine-pseudoephedrine (ALLEGRA-D) 60-120 MG 12 hr tablet Take 1 tablet by mouth 2 (two) times daily as needed.     magnesium oxide (MAG-OX) 400 MG tablet Take 400 mg by mouth daily.     metoprolol succinate (TOPROL-XL) 25 MG 24 hr tablet Take 1 tablet (25 mg total) by mouth daily. PATIENT MUST SCHEDULE APPOINTMENT FOR FUTURE REFILLS FIRST ATTEMPT 30 tablet 0   nitroGLYCERIN (NITROSTAT) 0.4 MG SL tablet Place 1 tablet (0.4 mg total) under the tongue every 5 (five) minutes as needed for chest pain. 25 tablet 2   pantoprazole (PROTONIX) 20 MG tablet Take 1 tablet (20 mg total) by mouth daily. 30 tablet 2   propranolol (INDERAL) 10 MG tablet Take 1 tablet (10 mg total) by mouth 3 (three) times daily as needed. For fast heart rate or palpitations 270 tablet 2   No current facility-administered medications for this visit.     Family History    Family History  Problem Relation Age of Onset   Hypertension Mother    Cancer Mother    Stroke Mother    Heart disease Mother    Heart failure Mother    Heart disease Father    Hyperlipidemia Father    Hypertension Father    Heart attack Father    Arthritis Sister    Hypertension Sister    Kidney disease Sister    Stroke Sister    Heart Problems Sister    Heart disease Sister    Heart Problems Sister    Hypertension Sister    Heart Problems Brother    Heart Problems Brother    Heart  Problems Brother    Heart Problems Brother    Breast cancer Neg Hx    She indicated that her mother is deceased. She indicated that her father is deceased. She indicated that both of her sisters are alive. She indicated that all of her four brothers are deceased. She indicated that the status of her neg hx is unknown.  Social History    Social History   Socioeconomic History   Marital status: Married    Spouse name: Not on file   Number of children: Not on file   Years of education: Not on file   Highest education level: Not on file  Occupational History   Not on file  Tobacco Use   Smoking status: Never  Smokeless tobacco: Never  Vaping Use   Vaping Use: Never used  Substance and Sexual Activity   Alcohol use: No   Drug use: No   Sexual activity: Never    Partners: Male  Other Topics Concern   Not on file  Social History Narrative   Not on file   Social Determinants of Health   Financial Resource Strain: Low Risk  (09/28/2018)   Overall Financial Resource Strain (CARDIA)    Difficulty of Paying Living Expenses: Not hard at all  Food Insecurity: No Food Insecurity (09/28/2018)   Hunger Vital Sign    Worried About Running Out of Food in the Last Year: Never true    Ran Out of Food in the Last Year: Never true  Transportation Needs: No Transportation Needs (09/28/2018)   PRAPARE - Hydrologist (Medical): No    Lack of Transportation (Non-Medical): No  Physical Activity: Unknown (09/28/2018)   Exercise Vital Sign    Days of Exercise per Week: 0 days    Minutes of Exercise per Session: Not on file  Stress: Not on file  Social Connections: Not on file  Intimate Partner Violence: Not At Risk (09/28/2018)   Humiliation, Afraid, Rape, and Kick questionnaire    Fear of Current or Ex-Partner: No    Emotionally Abused: No    Physically Abused: No    Sexually Abused: No     Review of Systems    General:  No chills, fever, night sweats or  weight changes.  Cardiovascular:  No chest pain, dyspnea on exertion, edema, orthopnea, palpitations, paroxysmal nocturnal dyspnea. Dermatological: No rash, lesions/masses Respiratory: No cough, dyspnea Urologic: No hematuria, dysuria Abdominal:   No nausea, vomiting, diarrhea, bright red blood per rectum, melena, or hematemesis Neurologic:  No visual changes, wkns, changes in mental status. All other systems reviewed and are otherwise negative except as noted above.     Physical Exam    VS:  BP 120/80 (BP Location: Left Arm, Patient Position: Sitting, Cuff Size: Normal)   Pulse 80   Ht '5\' 5"'$  (1.651 m)   Wt 132 lb (59.9 kg)   SpO2 98%   BMI 21.97 kg/m  , BMI Body mass index is 21.97 kg/m.     GEN: Well nourished, well developed, in no acute distress. HEENT: normal. Neck: Supple, no JVD, carotid bruits, or masses. Cardiac: RRR, no murmurs, rubs, or gallops. No clubbing, cyanosis, edema.  Radials/DP/PT 2+ and equal bilaterally.  Respiratory:  Respirations regular and unlabored, clear to auscultation bilaterally. GI: Soft, nontender, nondistended, BS + x 4. MS: no deformity or atrophy. Skin: warm and dry, no rash. Neuro:  Strength and sensation are intact. Psych: Normal affect.  Accessory Clinical Findings    ECG personally reviewed by me today-sinus rhythm with a rate of 80 with left atrial enlargement- No acute changes  Lab Results  Component Value Date   WBC 3.6 (L) 06/24/2020   HGB 12.2 06/24/2020   HCT 36.9 06/24/2020   MCV 90.2 06/24/2020   PLT 184.0 06/24/2020   Lab Results  Component Value Date   CREATININE 0.86 06/28/2020   BUN 21 06/28/2020   NA 139 06/28/2020   K 4.5 06/28/2020   CL 104 06/28/2020   CO2 32 06/28/2020   Lab Results  Component Value Date   ALT 19 10/03/2019   AST 23 10/03/2019   ALKPHOS 67 10/03/2019   BILITOT 0.4 10/03/2019   Lab Results  Component Value Date  CHOL 227 (H) 06/24/2020   HDL 60.30 06/24/2020   LDLCALC 147 (H)  06/24/2020   TRIG 102.0 06/24/2020   CHOLHDL 4 06/24/2020    No results found for: "HGBA1C"  Assessment & Plan   1.  Coronary artery calcification seen on CT scan without anginal symptoms and no ischemic changes noted on EKG.  So stable findings noted on scan 5 to 6 years previously.  2.  Mixed hyperlipidemia with a last LDL of 79 done 07/10/2021.  She is awaiting a follow-up appointment with her PCP in the upcoming weeks where her lipid panel should be redone.  She is to continue atorvastatin 10 mg daily with no refills needed at this time.  3.  Mitral valve prolapse with no significant murmur appreciated on exam.  Last echocardiogram was done in 2022 which revealed the mitral valve is normal in structure unable to exclude mild prolapse but she did have mild mitral valve regurgitation.  Currently stable with no further work-up needed at this time.  4.  Hypertension with a blood pressure today 120/80 with a goal to maintain blood pressure less than 130/80.  Blood pressure is stable and well-controlled on her current dose of Toprol XL 25 mg daily.  No changes made to her medication regimen today.  5.  Disposition patient to return to clinic in 11 to 12 months with EKG on return to see MD or APP, or sooner if needed.  Wyvonne Carda, NP 07/14/2022, 10:56 AM

## 2022-07-14 NOTE — Patient Instructions (Addendum)
Medication Instructions:   Your physician recommends that you continue on your current medications as directed. Please refer to the Current Medication list given to you today.  *If you need a refill on your cardiac medications before your next appointment, please call your pharmacy*   Lab Work:  None ordered  Testing/Procedures:  None ordered   Follow-Up: At [facility], you and your health needs are our priority.  As part of our continuing mission to provide you with exceptional heart care, we have created designated Provider Care Teams.  These Care Teams include your primary Cardiologist (physician) and Advanced Practice Providers (APPs -  Physician Assistants and Nurse Practitioners) who all work together to provide you with the care you need, when you need it.  We recommend signing up for the patient portal called "MyChart".  Sign up information is provided on this After Visit Summary.  MyChart is used to connect with patients for Virtual Visits (Telemedicine).  Patients are able to view lab/test results, encounter notes, upcoming appointments, etc.  Non-urgent messages can be sent to your provider as well.   To learn more about what you can do with MyChart, go to [email or web address].    Your next appointment:   1 year(s) w/ EKG  The format for your next appointment:   In Person  Provider:   You may see Ida Rogue, MD or one of the following Advanced Practice Providers on your designated Care Team:   Murray Hodgkins, NP Christell Faith, PA-C Cadence Kathlen Mody, [place]    Important Information About Sugar

## 2022-07-22 ENCOUNTER — Other Ambulatory Visit: Payer: Self-pay | Admitting: Cardiovascular Disease

## 2023-01-12 ENCOUNTER — Ambulatory Visit: Payer: Medicare Other | Attending: Infectious Diseases | Admitting: Physical Therapy

## 2023-01-12 DIAGNOSIS — M62838 Other muscle spasm: Secondary | ICD-10-CM | POA: Diagnosis present

## 2023-01-12 DIAGNOSIS — M542 Cervicalgia: Secondary | ICD-10-CM | POA: Diagnosis present

## 2023-01-12 NOTE — Therapy (Signed)
OUTPATIENT PHYSICAL THERAPY EVALUATION   Patient Name: Barbara Garcia MRN: [id number] DOB:July 02, 1941, 82 y.o., female Today's Date: 01/12/2023  END OF SESSION:  PT End of Session - 01/12/23 1320     Visit Number 1    Number of Visits 24    Date for PT Re-Evaluation 04/06/23    Authorization Type MEDICARE PART B reporting period from 01/12/2023    Progress Note Due on Visit 10    PT Start Time 1033    PT Stop Time 1115    PT Time Calculation (min) 42 min    Activity Tolerance Patient tolerated treatment well    Behavior During Therapy WFL for tasks assessed/performed             Past Medical History:  Diagnosis Date   Allergy    Anxiety    Arthritis    Benign essential tremor    Chronic dermatitis    Colon polyps    Combined form of senile cataract    Constipation    GERD (gastroesophageal reflux disease)    Hearing loss    Hemorrhoids    History of cystocele    History of shingles    Hyperglycemia    Hyperlipidemia    Hyperopia with astigmatism and presbyopia    Hypertension    Iris nevus    MVP (mitral valve prolapse)    Osteopenia    Osteoporosis    Posterior vitreous detachment, bilateral    Retinal drusen, bilateral    Sjogrens syndrome (HCC)    Vitamin D deficiency    Past Surgical History:  Procedure Laterality Date   ABDOMINAL HYSTERECTOMY     BOTOX INJECTION N/A 10/11/2018   Procedure: BOTOX INJECTION;  Surgeon: Jules Husbands, MD;  Location: [facility];  Service: General;  Laterality: N/A;   COLON SURGERY     hemmhoirdectomy with abcess fistula   COLONOSCOPY W/ BIOPSIES  06/18/2005   COLONOSCOPY WITH PROPOFOL N/A 08/03/2019   Procedure: COLONOSCOPY WITH PROPOFOL;  Surgeon: Lin Landsman, MD;  Location: [facility];  Service: Gastroenterology;  Laterality: N/A;   ESOPHAGOGASTRODUODENOSCOPY (EGD) WITH PROPOFOL N/A 08/03/2019   Procedure: ESOPHAGOGASTRODUODENOSCOPY (EGD) WITH PROPOFOL;  Surgeon: Lin Landsman, MD;  Location:  [place];  Service: Gastroenterology;  Laterality: N/A;   ORIF FINGER / THUMB FRACTURE Right    pin removed later   TONSILLECTOMY     Patient Active Problem List   Diagnosis Date Noted   Neurodermatitis 10/03/2019   Dyspnea on exertion 07/01/2019   Chest pain on exertion 07/01/2019   Iron deficiency anemia 07/01/2019   Bug bite 05/28/2019   Anemia 05/28/2019   Anal fissure    Rectal fissure 08/24/2018   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   Coronary artery calcification seen on CAT scan 07/28/2018   Globus sensation 02/22/2018   Hyperlipidemia 03/17/2017   Restless legs 03/17/2017   Posterior vitreous detachment, bilateral 09/29/2016   Retinal drusen, bilateral 09/29/2016   Chronic constipation 12/07/2013   Chronic dermatitis 12/07/2013   Combined form of senile cataract 07/26/2012   Hyperopia with astigmatism and presbyopia 07/26/2012   Iris nevus 07/26/2012   Vitamin D deficiency 02/19/2011   Benign essential tremor 11/25/2010   Colon polyps 11/25/2010   GERD (gastroesophageal reflux disease) 11/25/2010   MVP (mitral valve prolapse) 11/25/2010   Mild anxiety 11/25/2010   Osteopenia 11/25/2010   Sjogren's syndrome ([place]) 11/25/2010   History of shingles 11/25/2010    PCP: Leonel Ramsay, MD  REFERRING PROVIDER: Leonel Ramsay,  MD  REFERRING DIAG: neck pain  THERAPY DIAG:  Cervicalgia  Other muscle spasm  Rationale for Evaluation and Treatment: Rehabilitation  ONSET DATE: chronic but worsened in Oct/Nov 2023  SUBJECTIVE:                                                                                                                                                                                                         SUBJECTIVE STATEMENT: Patient reports that over the years she has let gravity take over and start slumping over more and more. She does a lot of crafts and she feels like she is always working with her head down. She states her neck pain started  out with a burning feeling in her bilateral UT. In Oct/Nov she did a sewing projects and she spent a lot of time bending over and it got worse and has felt bad ever since. She was told to always to see a Nurse, children's. Her daughter is a physical therapy in [place]. She does Event organiser. Pateint sates she has been doing exercises her daughter gave her but probably not enough and that her daughter helped set up PT so she will have someone to "keep after me." She denies numbness and tingling. Her husband died last summer.   PERTINENT HISTORY:  Patient is a 82 y.o. female who presents to outpatient physical therapy with a referral for medical diagnosis neck pain and patient preference for MDT certified physical therapist This patient's chief complaints consist of neck pain that radiates to base of scull leading to the following functional deficits: difficulty with usual activities including crafts, cleaning, driving, sleeping, turning her head, moving her head, reading. Relevant past medical history and comorbidities include anxiety, arthritis, benign essential tremor, cataracts, GERD, hearing loss, history of cystocele, HLD, mitral valve prolapse, osteoporosis, bilateral posterior vitreous detachment, bilateral retinal drusen, Sjogren's syndrome, ORIF R finger thumb (for fx, pin removed later), dyspnea on exertion, chest pain on exertion, see chart for more details.  Patient denies hx of cancer, stroke, seizures, lung problems, diabetes, unexplained weight loss, unexplained changes in bowel or bladder problems, unexplained stumbling or dropping things, and spinal surgery    PRECAUTIONS: None  WEIGHT BEARING RESTRICTIONS: No  FALLS:  Has patient fallen in last 6 months? No  LIVING ENVIRONMENT: Lives with: lives alone Lives in: House/apartment Stairs: None to get in. She has an upstairs but seldom goes up stairs.  Does not use assistive device.   OCCUPATION: haven't work in 8 years.  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   PLOF: Independent  PATIENT GOALS: "to relax my neck and make it quit hurting" "would very much like to be able to turn my head"   MDT Lumbar Physical Therapy Evaluation  Referral (GP/Ortho/Self/Other): GP Age: 31  [place] Work, mechanical stresses: sewing, crafts, cleaning  Leisure, mechanical Stresses:  Functional disability from present episode:  PLOF: no limittions from her neck, fully independent.  CLOF: difficulty with usual activities including crafts, cleaning, driving, sleeping, turning her head, moving her head, reading.   SELF-REPORTED FUNCTION FOTO score: 41/100 (neck questionnaire)  PAIN:  Are you having pain? Yes: NPRS scale: Current: 2-3/10,  Best: 0/10, Worst: 6-7/10.  Location of symptoms (from body map):  initially burning over B UT, progressed to radiating up into her head, she now has pain in the base of her scull that is the worst. She does get headaches at the base of her scull.    HISTORY Present symptoms: pain base of scull radiating to base of scull and towards B upper traps. Right side may be worse than left.  Present since:  getting worse over time (hurting more than 2 months ago, and more constant than 2 months ago). Commenced as a result of: a lot of sewing in Oct/Nov.  Symptoms at onset: burning in B UT Constant symptoms: none Intermittent symptoms: neck, scull, and upper traps  Worse:  Bending Turning head Looking up Lying  Getting up from lying (feels like I need to pick up my head to move it).  on the move  Other: changing clothes, moving head, driving Better:  when still Sitting perfectly straight with feet on floor.  Keeping head straight while sleeping Other: being still, ibuprofen (but she does not like to take it - will take if NPRS >5/10), heat.   Disturbed sleep: yes, wakes up with it hurting (can go back to sleep after a while if she changes positions).  Sleeping postures: she used to  sleep in a fiddle position on her right side, now she must keep her head straighter and wakes up on her back.  Surface: medium-soft surface Previous episodes: 0 but does report it has been gradually coming on before the  Year of first episode: 2023 Previous history:  Previous treatments (and result): exercises from PT daughter (but feels it is not as much as she should have), ibuprofen, heat, ice.    SPECIFIC QUESTIONS:  Cough/sneeze/strain: increased pain Bladder/Bowels: normal Gait: normal Medications: NSAIDS, see meds list. She mainly takes esalopram, atovostatin, and metropolol daily and the others PRN) General health: good Imaging: xray that showed mild arthritis per patient Recent or major surgery: no Night pain: no Accidents: no Unexplained weight loss: no   OBJECTIVE EXAMINATION  POSTURE Sitting: good (increased kyphosis at CT junction, appears very aware of posture).  Wry neck:nil Correction of posture: no effect Other observations: appears very aware of posture   NEUROLOGICAL:  Motor deficit: WNL (see muscle performance below) Sensory deficit: Dermatomes C2-T1 appears equal and intact to light bilaterally.   MOVEMENT LOSS CERVICAL SPINE AROM *Indicates pain Flexion: 45 (stopped by pain) Extension: 40 (stopped by pain).  Side Flexion:   R 25 (limited by pain)  L 13 (limited by pain) Rotation:  R 28 (limited by pain and stiffness) L 28 (limited by pain and stiffness) Retraction: 0.5 inches (feels good) Protrusion: 1 inch  (not painful)  MUSCLE PERFORMANCE (MMT) Shoulder  Flexion: R = 5/5, L = 5/5.  Abduction: R = 5/5, L = 5/5. External rotation: R = 4+/5, L =  3+/5. Internal rotation: R = 5/5, L = 5/5. Elbow Flexion: R = 5/5, L = 5/5. Extension: R = 5/5, L = 5/5. B thumb extension and finger abduction WNL   REPEATED MOTIONS TESTING  Pretest Symptoms Sitting: pain only with movement, especially extension and rotation side to side.   Retraction in  sitting During: end range stiffness After: no effect Mechanical Response: no effect Repeated Retraction in Sitting 1x10 then with patient overpressure 1x10.  During: end range stiffness each time After: able to move further with less pain as below Mechanical Response: Increased ROM to the left rotation and possibly extension (patient feels it is better, PT cannot appreciate).  Retraction with upper cervical flexion: 1 rep  - reproduces pain at base of neck strongly.   ACCESSORY MOTION: Deferred   PALPATION: SEATED - significant tension in bilateral upper traps, manual manipulation of muscles "feels good" to patient.   TODAY'S TREATMENT:    Education on HEP including 2x10 cervical retraction with self overpressure every 2 hours until next PT appointment (tomorrow).  - Education on diagnosis, prognosis, POC, anatomy and physiology of current condition.   Pt required multimodal cuing for proper technique and to facilitate improved neuromuscular control, strength, range of motion, and functional ability resulting in improved performance and form.  PATIENT EDUCATION:  Education details: Exercise purpose/form. Self management techniques. Education on diagnosis, prognosis, POC, anatomy and physiology of current condition. Education on HEP including handout  Person educated: Patient Education method: Explanation, Demonstration, Tactile cues, and Verbal cues Education comprehension: verbalized understanding, returned demonstration, verbal cues required, tactile cues required, and needs further education  HOME EXERCISE PROGRAM: Access Code: [email or web address] URL: [email or web address] Date: 01/12/2023 Prepared by: Rosita Kea  Exercises - Seated Posture with Lumbar Roll  - Cervical Retraction with Overpressure  - 8 x daily - 2 sets - 10 reps - 1 second hold (Patient did not receive handout on initial eval)  ASSESSMENT:  CLINICAL IMPRESSION: Patient is a 82 y.o. female referred  to outpatient physical therapy with a medical diagnosis of neck pain who presents with signs and symptoms consistent with subacute chronic neck pain and stiffness. 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 Patient does demonstrate what appears to be multidirectional joint stiffness in the cervical spine and hypertonic B UT muscles that are likely contributing to her pain experience. Would likely benefit from soft tissue mobilization and/or dry needling as well as MDT approach to help alleviate pain  Patient presents with significant pain, ROM, muscle tension, joint stiffness, motor control, muscle performance (strength/power/endurance) and activity tolerance impairments that are limiting ability to complete her usual activities including crafts, cleaning, driving, sleeping, turning her head, moving her head, reading without difficulty. Patient will benefit from skilled physical therapy intervention to address current body structure impairments and activity limitations to improve function and work towards goals set in current POC in order to return to prior level of function or maximal functional improvement.    OBJECTIVE IMPAIRMENTS: decreased activity tolerance, decreased coordination, decreased endurance, decreased knowledge of condition, decreased ROM, decreased strength, hypomobility, impaired perceived functional ability, increased muscle spasms, impaired flexibility, and pain.   ACTIVITY LIMITATIONS: bending and sleeping  PARTICIPATION LIMITATIONS: meal prep, cleaning, laundry, driving, community activity, and   difficulty with usual activities including crafts, cleaning, driving, sleeping, turning her head, moving her head, reading  PERSONAL FACTORS: Age, Time since onset of injury/illness/exacerbation, and 3+ comorbidities:   anxiety, arthritis, benign essential tremor, cataracts,  GERD, hearing loss, history  of cystocele, HLD, mitral valve prolapse, osteoporosis, bilateral posterior vitreous detachment, bilateral retinal drusen, Sjogren's syndrome, ORIF R finger thumb (for fx, pin removed later), dyspnea on exertion, chest pain on exertion, see chart for more details are also affecting patient's functional outcome.   REHAB POTENTIAL: Good  CLINICAL DECISION MAKING: Stable/uncomplicated  EVALUATION COMPLEXITY: Low   GOALS: Goals reviewed with patient? No  SHORT TERM GOALS: Target date: 01/26/2023  Patient will be independent with initial home exercise program for self-management of symptoms. Baseline: Initial HEP provided at IE (01/12/23); Goal status: INITIAL   LONG TERM GOALS: Target date: 04/06/2023  Patient will be independent with a long-term home exercise program for self-management of symptoms.  Baseline: Initial HEP provided at IE (01/12/23); Goal status: INITIAL  2.  Patient will demonstrate improved FOTO to equal or greater than 57 by visit #11 to demonstrate improvement in overall condition and self-reported functional ability.  Baseline: 41 (01/12/23); Goal status: INITIAL  3.  Patient will demonstrate improved cervical spine rotation to equal or greater than 60 degrees each direction with no increase in pain to improve her ability to check her blind spot while driving.  Baseline: 29 degrees each direction (01/12/23); Goal status: INITIAL  4.  Patient will complete community, work and/or recreational activities with 75% less limitation due to current condition.  Baseline: difficulty with usual activities including crafts, cleaning, driving, sleeping, turning her head, moving her head, reading (01/12/23); Goal status: INITIAL  5.  Patient will report pain with functional activity decreased to equal or less than 1/10 to improve her ability to return to making crafts and cleaning with less difficulty.  Baseline: up to 7/10 (01/12/2023);  Goal status: INITIAL   PLAN:  PT  FREQUENCY: 1-2x/week  PT DURATION: 12 weeks  PLANNED INTERVENTIONS: Therapeutic exercises, Therapeutic activity, Neuromuscular re-education, Patient/Family education, Self Care, Joint mobilization, Dry Needling, Electrical stimulation, Spinal mobilization, Cryotherapy, Moist heat, Manual therapy, and Re-evaluation  PLAN FOR NEXT SESSION: assess response to repeated motions since last PT session, progress accordingly. Manual therapy as appropriate.    Everlean Alstrom. Graylon Good, PT, DPT 01/12/23, 1:50 PM  [facility] [address] [place], [place] [address] P: [phone] I F: [phone]

## 2023-01-13 ENCOUNTER — Encounter: Payer: Self-pay | Admitting: Physical Therapy

## 2023-01-13 ENCOUNTER — Ambulatory Visit: Payer: Medicare Other | Admitting: Physical Therapy

## 2023-01-13 DIAGNOSIS — M542 Cervicalgia: Secondary | ICD-10-CM

## 2023-01-13 DIAGNOSIS — M62838 Other muscle spasm: Secondary | ICD-10-CM

## 2023-01-13 NOTE — Therapy (Signed)
OUTPATIENT PHYSICAL THERAPY TREATMENT NOTE   Patient Name: Elmeda Suver MRN: [id number] DOB:08-16-1941, 82 y.o., female Today's Date: 01/13/2023  PCP: Leonel Ramsay, MD  REFERRING PROVIDER: Leonel Ramsay, MD   END OF SESSION:   PT End of Session - 01/13/23 1204     Visit Number 2    Number of Visits 24    Date for PT Re-Evaluation 04/06/23    Authorization Type MEDICARE PART B reporting period from 01/12/2023    Progress Note Due on Visit 10    PT Start Time 1119    PT Stop Time 1200    PT Time Calculation (min) 41 min    Activity Tolerance Patient tolerated treatment well    Behavior During Therapy WFL for tasks assessed/performed             Past Medical History:  Diagnosis Date   Allergy    Anxiety    Arthritis    Benign essential tremor    Chronic dermatitis    Colon polyps    Combined form of senile cataract    Constipation    GERD (gastroesophageal reflux disease)    Hearing loss    Hemorrhoids    History of cystocele    History of shingles    Hyperglycemia    Hyperlipidemia    Hyperopia with astigmatism and presbyopia    Hypertension    Iris nevus    MVP (mitral valve prolapse)    Osteopenia    Osteoporosis    Posterior vitreous detachment, bilateral    Retinal drusen, bilateral    Sjogrens syndrome ([place])    Vitamin D deficiency    Past Surgical History:  Procedure Laterality Date   ABDOMINAL HYSTERECTOMY     BOTOX INJECTION N/A 10/11/2018   Procedure: BOTOX INJECTION;  Surgeon: Jules Husbands, MD;  Location: [facility];  Service: General;  Laterality: N/A;   COLON SURGERY     hemmhoirdectomy with abcess fistula   COLONOSCOPY W/ BIOPSIES  06/18/2005   COLONOSCOPY WITH PROPOFOL N/A 08/03/2019   Procedure: COLONOSCOPY WITH PROPOFOL;  Surgeon: Lin Landsman, MD;  Location: [facility];  Service: Gastroenterology;  Laterality: N/A;   ESOPHAGOGASTRODUODENOSCOPY (EGD) WITH PROPOFOL N/A 08/03/2019   Procedure:  ESOPHAGOGASTRODUODENOSCOPY (EGD) WITH PROPOFOL;  Surgeon: Lin Landsman, MD;  Location: [facility];  Service: Gastroenterology;  Laterality: N/A;   ORIF FINGER / THUMB FRACTURE Right    pin removed later   TONSILLECTOMY     Patient Active Problem List   Diagnosis Date Noted   Neurodermatitis 10/03/2019   Dyspnea on exertion 07/01/2019   Chest pain on exertion 07/01/2019   Iron deficiency anemia 07/01/2019   Bug bite 05/28/2019   Anemia 05/28/2019   Anal fissure    Rectal fissure 08/24/2018   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   Coronary artery calcification seen on CAT scan 07/28/2018   Globus sensation 02/22/2018   Hyperlipidemia 03/17/2017   Restless legs 03/17/2017   Posterior vitreous detachment, bilateral 09/29/2016   Retinal drusen, bilateral 09/29/2016   Chronic constipation 12/07/2013   Chronic dermatitis 12/07/2013   Combined form of senile cataract 07/26/2012   Hyperopia with astigmatism and presbyopia 07/26/2012   Iris nevus 07/26/2012   Vitamin D deficiency 02/19/2011   Benign essential tremor 11/25/2010   Colon polyps 11/25/2010   GERD (gastroesophageal reflux disease) 11/25/2010   MVP (mitral valve prolapse) 11/25/2010   Mild anxiety 11/25/2010   Osteopenia 11/25/2010   Sjogren's syndrome ([place]) 11/25/2010   History of  shingles 11/25/2010    REFERRING DIAG: neck pain  THERAPY DIAG:  Cervicalgia  Other muscle spasm  Rationale for Evaluation and Treatment: Rehabilitation  PERTINENT HISTORY: Patient is a 82 y.o. female who presents to outpatient physical therapy with a referral for medical diagnosis neck pain and patient preference for MDT certified physical therapist This patient's chief complaints consist of neck pain that radiates to base of scull leading to the following functional deficits: difficulty with usual activities including crafts, cleaning, driving, sleeping, turning her head, moving her head, reading. Relevant past medical history and  comorbidities include anxiety, arthritis, benign essential tremor, cataracts, GERD, hearing loss, history of cystocele, HLD, mitral valve prolapse, osteoporosis, bilateral posterior vitreous detachment, bilateral retinal drusen, Sjogren's syndrome, ORIF R finger thumb (for fx, pin removed later), dyspnea on exertion, chest pain on exertion, see chart for more details.  Patient denies hx of cancer, stroke, seizures, lung problems, diabetes, unexplained weight loss, unexplained changes in bowel or bladder problems, unexplained stumbling or dropping things, and spinal surgery   PRECAUTIONS: osteoporosis  SUBJECTIVE:                                                                                                                                                                                      SUBJECTIVE STATEMENT:  Patient reports she has been doing her HEP every two hours since her initial eval yesterday and she cannot tell it is feeling any better. She states her daughter pointed out that her neck problems have been present since she got a new bed. Her daughter suggested she sleep in the guest bedroom where her matress is firmer for a few days to see if this helps. She sleeps with her head elevated for reflux since her husband died on her bed, so she is unsure if it could be the angle of the bed or the softness that is causing the problem if she finds her bed is the problem. She states she slept in the guest bedroom last night.   Long term exercise for fitness/health plan/ideas (that patient can see herself doing or expressed she wants to do): walking to the mailbox regularly, walking or using the machines at her senior living community's fitness area regularly. She is not a morning person and likes to be active in the afternoon or evening.     PAIN:  Are you having pain? NPRS 0/10 at rest not moving, it still hurts if she moves her head.    OBJECTIVE:   CERVICAL SPINE AROM (measured at start of  visit).  *Indicates pain Flexion: 42 (stopped by pain) Extension: 45 (stopped by pain).  Side Flexion:  R 27 (limited by stiffness)      L 24 (limited by pain) Rotation:  R 36 (limited by stiffness more than pain) L 34 (limited by pain and stiffness) Retraction: 0.5 inches (limited by pain and stiffness) Protrusion: 1.5 inch  (not painful)  ACCESSORY MOTION Tender (concordant) to CPA at upper cervical segments  TODAY'S TREATMENT:    Therapeutic exercise: to centralize symptoms and improve ROM, strength, muscular endurance, and activity tolerance required for successful completion of functional activities.  - measurements to assess response to HEP (see above) - review of HEP with seated cervical retraction with self overpressure (excellent form) 1x10 - supine deep neck flexor training, 3x10 with 5 second hold. Additional reps completed to learn how to avoid activating SCM.  - seated AROM rotation 1x5 each direction - Education on HEP including handout   Manual therapy: to reduce pain and tissue tension, improve range of motion, neuromodulation, in order to promote improved ability to complete functional activities. SUPINE - STM to posterior cervical spine musculature and B SCM with particular attention to suboccipital muscles and B UT. (Noted a lot of tension and banding in B UE, and most sensitive with concordant pain in suboccipital region).  - PROM cervical spine rotation 2-3 reps each direction, empty end feel limited by pain (R  > L suboccipital region painful both directions) - gentle cervical spine manual traction, intermittent - gentle CPA along cervical spine (reproduced concordant pain most strongly at upper cervical segments).   Pt required multimodal cuing for proper technique and to facilitate improved neuromuscular control, strength, range of motion, and functional ability resulting in improved performance and form.   PATIENT EDUCATION:  Education details: Exercise  purpose/form. Self management techniques. HEP including handout  Person educated: Patient Education method: Explanation, Demonstration, Tactile cues, and Verbal cues Education comprehension: verbalized understanding, returned demonstration, verbal cues required, tactile cues required, and needs further education   HOME EXERCISE PROGRAM: Access Code: [email or web address] URL: [email or web address] Date: 01/13/2023 Prepared by: Rosita Kea  Exercises - Seated Posture with Lumbar Roll  - Cervical Retraction with Overpressure  - 8 x daily - 1-2 sets - 10 reps - 1 second hold - Neck Rotation Stretch  - 3 x daily - 1 sets - 10 reps - 1 second hold - Supine Deep Neck Flexor Nods  - 1 x daily - 3 sets - 10 reps - 5 seconds hold   ASSESSMENT:   CLINICAL IMPRESSION: Patient arrives reporting similar symptoms to initial eval yesterday and with excellent participation in HEP. Measurements of AROM at start of session demonstrates mild improvements in motion especially for rotation and sidebending, which continues to support MDT classification of derangement syndrome with extension preference. Initiated STM to very tight cervical spien musculature to decrease pain and tension and make specific exercises less painful. Patient responded well with decreased pain and improved motion post visit. She also started deep neck flexor strengthening to improve postural control, decrease pain, and strengthen neck and posture for functional activities while continuing to stretch upper cervical spine.  Plan to continue with specific exercises with directional preference and other interventions as appropriate to decrease pain and improve function. Patient is very tender with upper cervical spine flexion and likely would benefit from repeated movement in upper cervical spine flexion and improved ROM here. Patient would benefit from continued management of limiting condition by skilled physical therapist to address remaining  impairments and functional limitations to work towards stated goals and return to PLOF or maximal functional  independence.   From Initial PT Evaluation 01/12/2023: Patient is a 82 y.o. female referred to outpatient physical therapy with a medical diagnosis of neck pain who presents with signs and symptoms consistent with subacute chronic neck pain and stiffness. 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 Patient does demonstrate what appears to be multidirectional joint stiffness in the cervical spine and hypertonic B UT muscles that are likely contributing to her pain experience. Would likely benefit from soft tissue mobilization and/or dry needling as well as MDT approach to help alleviate pain  Patient presents with significant pain, ROM, muscle tension, joint stiffness, motor control, muscle performance (strength/power/endurance) and activity tolerance impairments that are limiting ability to complete her usual activities including crafts, cleaning, driving, sleeping, turning her head, moving her head, reading without difficulty. Patient will benefit from skilled physical therapy intervention to address current body structure impairments and activity limitations to improve function and work towards goals set in current POC in order to return to prior level of function or maximal functional improvement.      OBJECTIVE IMPAIRMENTS: decreased activity tolerance, decreased coordination, decreased endurance, decreased knowledge of condition, decreased ROM, decreased strength, hypomobility, impaired perceived functional ability, increased muscle spasms, impaired flexibility, and pain.    ACTIVITY LIMITATIONS: bending and sleeping   PARTICIPATION LIMITATIONS: meal prep, cleaning, laundry, driving, community activity, and   difficulty with usual activities including crafts, cleaning, driving, sleeping, turning her head,  moving her head, reading   PERSONAL FACTORS: Age, Time since onset of injury/illness/exacerbation, and 3+ comorbidities:   anxiety, arthritis, benign essential tremor, cataracts, GERD, hearing loss, history of cystocele, HLD, mitral valve prolapse, osteoporosis, bilateral posterior vitreous detachment, bilateral retinal drusen, Sjogren's syndrome, ORIF R finger thumb (for fx, pin removed later), dyspnea on exertion, chest pain on exertion, see chart for more details are also affecting patient's functional outcome.    REHAB POTENTIAL: Good   CLINICAL DECISION MAKING: Stable/uncomplicated   EVALUATION COMPLEXITY: Low     GOALS: Goals reviewed with patient? No   SHORT TERM GOALS: Target date: 01/26/2023   Patient will be independent with initial home exercise program for self-management of symptoms. Baseline: Initial HEP provided at IE (01/12/23); Goal status: MET     LONG TERM GOALS: Target date: 04/06/2023   Patient will be independent with a long-term home exercise program for self-management of symptoms.  Baseline: Initial HEP provided at IE (01/12/23); Goal status: In-progress   2.  Patient will demonstrate improved FOTO to equal or greater than 57 by visit #11 to demonstrate improvement in overall condition and self-reported functional ability.  Baseline: 41 (01/12/23); Goal status: In-progress   3.  Patient will demonstrate improved cervical spine rotation to equal or greater than 60 degrees each direction with no increase in pain to improve her ability to check her blind spot while driving.  Baseline: 29 degrees each direction (01/12/23); Goal status: In-progress   4.  Patient will complete community, work and/or recreational activities with 75% less limitation due to current condition.  Baseline: difficulty with usual activities including crafts, cleaning, driving, sleeping, turning her head, moving her head, reading (01/12/23); Goal status: In-progress   5.  Patient will  report pain with functional activity decreased to equal or less than 1/10 to improve her ability to return to making crafts and cleaning with less difficulty.  Baseline: up to 7/10 (01/12/2023);  Goal status: In-progress     PLAN:   PT FREQUENCY: 1-2x/week  PT DURATION: 12 weeks   PLANNED INTERVENTIONS: Therapeutic exercises, Therapeutic activity, Neuromuscular re-education, Patient/Family education, Self Care, Joint mobilization, Dry Needling, Electrical stimulation, Spinal mobilization, Cryotherapy, Moist heat, Manual therapy, and Re-evaluation   PLAN FOR NEXT SESSION: assess response to repeated motions since last PT session, progress accordingly. Manual therapy as appropriate.      Nancy Nordmann, PT, DPT 01/13/2023, 12:18 PM  [facility] [address] [place], [place] [address] P: [phone] I F: [phone]

## 2023-01-20 ENCOUNTER — Encounter: Payer: Self-pay | Admitting: Physical Therapy

## 2023-01-20 ENCOUNTER — Ambulatory Visit: Payer: Medicare Other | Admitting: Physical Therapy

## 2023-01-20 DIAGNOSIS — M542 Cervicalgia: Secondary | ICD-10-CM | POA: Diagnosis not present

## 2023-01-20 DIAGNOSIS — M62838 Other muscle spasm: Secondary | ICD-10-CM

## 2023-01-20 NOTE — Therapy (Signed)
OUTPATIENT PHYSICAL THERAPY TREATMENT NOTE   Patient Name: Barbara Garcia MRN: [id number] DOB:1941-02-01, 82 y.o., female Today's Date: 01/20/2023  PCP: Leonel Ramsay, MD  REFERRING PROVIDER: Leonel Ramsay, MD   END OF SESSION:   PT End of Session - 01/20/23 1152     Visit Number 3    Number of Visits 24    Date for PT Re-Evaluation 04/06/23    Authorization Type MEDICARE PART B reporting period from 01/12/2023    Progress Note Due on Visit 10    PT Start Time 1122    PT Stop Time 1200    PT Time Calculation (min) 38 min    Activity Tolerance Patient tolerated treatment well    Behavior During Therapy WFL for tasks assessed/performed              Past Medical History:  Diagnosis Date   Allergy    Anxiety    Arthritis    Benign essential tremor    Chronic dermatitis    Colon polyps    Combined form of senile cataract    Constipation    GERD (gastroesophageal reflux disease)    Hearing loss    Hemorrhoids    History of cystocele    History of shingles    Hyperglycemia    Hyperlipidemia    Hyperopia with astigmatism and presbyopia    Hypertension    Iris nevus    MVP (mitral valve prolapse)    Osteopenia    Osteoporosis    Posterior vitreous detachment, bilateral    Retinal drusen, bilateral    Sjogrens syndrome ([place])    Vitamin D deficiency    Past Surgical History:  Procedure Laterality Date   ABDOMINAL HYSTERECTOMY     BOTOX INJECTION N/A 10/11/2018   Procedure: BOTOX INJECTION;  Surgeon: Jules Husbands, MD;  Location: [facility];  Service: General;  Laterality: N/A;   COLON SURGERY     hemmhoirdectomy with abcess fistula   COLONOSCOPY W/ BIOPSIES  06/18/2005   COLONOSCOPY WITH PROPOFOL N/A 08/03/2019   Procedure: COLONOSCOPY WITH PROPOFOL;  Surgeon: Lin Landsman, MD;  Location: [facility];  Service: Gastroenterology;  Laterality: N/A;   ESOPHAGOGASTRODUODENOSCOPY (EGD) WITH PROPOFOL N/A 08/03/2019   Procedure:  ESOPHAGOGASTRODUODENOSCOPY (EGD) WITH PROPOFOL;  Surgeon: Lin Landsman, MD;  Location: [place];  Service: Gastroenterology;  Laterality: N/A;   ORIF FINGER / THUMB FRACTURE Right    pin removed later   TONSILLECTOMY     Patient Active Problem List   Diagnosis Date Noted   Neurodermatitis 10/03/2019   Dyspnea on exertion 07/01/2019   Chest pain on exertion 07/01/2019   Iron deficiency anemia 07/01/2019   Bug bite 05/28/2019   Anemia 05/28/2019   Anal fissure    Rectal fissure 08/24/2018   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   Coronary artery calcification seen on CAT scan 07/28/2018   Globus sensation 02/22/2018   Hyperlipidemia 03/17/2017   Restless legs 03/17/2017   Posterior vitreous detachment, bilateral 09/29/2016   Retinal drusen, bilateral 09/29/2016   Chronic constipation 12/07/2013   Chronic dermatitis 12/07/2013   Combined form of senile cataract 07/26/2012   Hyperopia with astigmatism and presbyopia 07/26/2012   Iris nevus 07/26/2012   Vitamin D deficiency 02/19/2011   Benign essential tremor 11/25/2010   Colon polyps 11/25/2010   GERD (gastroesophageal reflux disease) 11/25/2010   MVP (mitral valve prolapse) 11/25/2010   Mild anxiety 11/25/2010   Osteopenia 11/25/2010   Sjogren's syndrome ([place]) 11/25/2010   History  of shingles 11/25/2010    REFERRING DIAG: neck pain  THERAPY DIAG:  Cervicalgia  Other muscle spasm  Rationale for Evaluation and Treatment: Rehabilitation  PERTINENT HISTORY: Patient is a 82 y.o. female who presents to outpatient physical therapy with a referral for medical diagnosis neck pain and patient preference for MDT certified physical therapist This patient's chief complaints consist of neck pain that radiates to base of scull leading to the following functional deficits: difficulty with usual activities including crafts, cleaning, driving, sleeping, turning her head, moving her head, reading. Relevant past medical history and  comorbidities include anxiety, arthritis, benign essential tremor, cataracts, GERD, hearing loss, history of cystocele, HLD, mitral valve prolapse, osteoporosis, bilateral posterior vitreous detachment, bilateral retinal drusen, Sjogren's syndrome, ORIF R finger thumb (for fx, pin removed later), dyspnea on exertion, chest pain on exertion, see chart for more details.  Patient denies hx of cancer, stroke, seizures, lung problems, diabetes, unexplained weight loss, unexplained changes in bowel or bladder problems, unexplained stumbling or dropping things, and spinal surgery   PRECAUTIONS: osteoporosis  SUBJECTIVE:                                                                                                                                                                                      SUBJECTIVE STATEMENT:  Patient reports she has been doing her HEP off and on every day. She states she had a lot of pain on Monday and took Alieve that helped her. She took a another Aleive last night. She slept back in her bed on Sunday night and thought that might have contributed to more pain on Monday. She states she was a little sore after her last PT session. She states overall she cannot tell any significant difference in her condition.   Long term exercise for fitness/health plan/ideas (that patient can see herself doing or expressed she wants to do): walking to the mailbox regularly, walking or using the machines at her senior living community's fitness area regularly. She is not a morning person and likes to be active in the afternoon or evening.     PAIN:  Are you having pain? NPRS 1-2/10 at rest not moving, incrased to 01/24/09 when she turns her head.   OBJECTIVE:   CERVICAL SPINE AROM (measured at start of visit).  *Indicates pain Flexion: 45 (stopped by stiffness) Extension: 40 (stopped by stiffness, and "can feel it").  Side Flexion:       R 20 (limited by pain)      L 20 (limited by  pain/stiffness) Rotation:  R 35 (limited by pain) L 34 (limited by pain) Retraction: 0.5 inches (limited by stiffness) Protrusion: 1  inch  (not painful)  ACCESSORY MOTION Tender (concordant) to CPA at upper cervical segments  TODAY'S TREATMENT:    Therapeutic exercise: to centralize symptoms and improve ROM, strength, muscular endurance, and activity tolerance required for successful completion of functional activities.  - measurements to assess response to HEP (see above) - supine deep neck flexor training, 2x10 with 5 second hold.  - seated AROM rotation 3x1 each direction to check for changes after interventions.  - seated repeated cervical retraction with upper cervical flexion with self overpressure, 2x10 (feels strongly at right base of neck).  - Education on HEP including handout   Manual therapy: to reduce pain and tissue tension, improve range of motion, neuromodulation, in order to promote improved ability to complete functional activities. SUPINE - STM to posterior cervical spine musculature with particular attention to suboccipital muscles (most sensitive with concordant pain in suboccipital region).  - PROM cervical spine rotation 2-3 reps each direction, empty end feel limited by pain (R  > L suboccipital region painful both directions) - gentle cervical spine manual traction, intermittent - gentle cervical retraction with clinician overpressure, 2x6 - gentle upper cervical spine mobilization in slgiht contralateral sidebending and ipsilateral rotation, grade II-III. 1x6 each side (felt in ipsilateral upper cervical spine).   Pt required multimodal cuing for proper technique and to facilitate improved neuromuscular control, strength, range of motion, and functional ability resulting in improved performance and form.   PATIENT EDUCATION:  Education details: Exercise purpose/form. Self management techniques. HEP including handout  Person educated: Patient Education method:  Explanation, Demonstration, Tactile cues, and Verbal cues Education comprehension: verbalized understanding, returned demonstration, verbal cues required, tactile cues required, and needs further education   HOME EXERCISE PROGRAM: Access Code: [email or web address] URL: [email or web address] Date: 01/20/2023 Prepared by: Rosita Kea  Exercises - Seated Posture with Lumbar Roll  - Neck Rotation Stretch  - 3 x daily - 1 sets - 10 reps - 1 second hold - Supine Deep Neck Flexor Nods  - 1 x daily - 3 sets - 10 reps - 5 seconds hold  [place] at [email or web address]" using code: [email or web address] Suboccipital Stretch -  Repeat 10 Repetitions, Complete 3 Sets, Perform 2 Times a Day   ASSESSMENT:   CLINICAL IMPRESSION: Patient arrives with no change in symptoms or ROM since last PT session despite apparent regular participation in HEP with repeated retraction. Patient remains very tight and tender to end range movement in all directions, but especially rotation. Continued with gentle STM and joint mobilizations targeting upper cervical spine. Updated HEP to include repeated upper cervical spine flexion to help decrease tension and pain here and improve rotation. Patient demonstrated increased R rotation by end of session but had limited change in left rotation. MDT classification of derangement syndrome less likely given minimal response to repeated retraction. Dysfunction or other classification much more likely. Patient's heightened sensitive is reminiscent of WAD but without known trauma. Patient would benefit from continued management of limiting condition by skilled physical therapist to address remaining impairments and functional limitations to work towards stated goals and return to PLOF or maximal functional independence.   From Initial PT Evaluation 01/12/2023: Patient is a 82 y.o. female referred to outpatient physical therapy with a medical diagnosis of neck pain who  presents with signs and symptoms consistent with subacute chronic neck pain and stiffness. 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 Patient does demonstrate what appears to be multidirectional joint stiffness  in the cervical spine and hypertonic B UT muscles that are likely contributing to her pain experience. Would likely benefit from soft tissue mobilization and/or dry needling as well as MDT approach to help alleviate pain  Patient presents with significant pain, ROM, muscle tension, joint stiffness, motor control, muscle performance (strength/power/endurance) and activity tolerance impairments that are limiting ability to complete her usual activities including crafts, cleaning, driving, sleeping, turning her head, moving her head, reading without difficulty. Patient will benefit from skilled physical therapy intervention to address current body structure impairments and activity limitations to improve function and work towards goals set in current POC in order to return to prior level of function or maximal functional improvement.      OBJECTIVE IMPAIRMENTS: decreased activity tolerance, decreased coordination, decreased endurance, decreased knowledge of condition, decreased ROM, decreased strength, hypomobility, impaired perceived functional ability, increased muscle spasms, impaired flexibility, and pain.    ACTIVITY LIMITATIONS: bending and sleeping   PARTICIPATION LIMITATIONS: meal prep, cleaning, laundry, driving, community activity, and   difficulty with usual activities including crafts, cleaning, driving, sleeping, turning her head, moving her head, reading   PERSONAL FACTORS: Age, Time since onset of injury/illness/exacerbation, and 3+ comorbidities:   anxiety, arthritis, benign essential tremor, cataracts, GERD, hearing loss, history of cystocele, HLD, mitral valve prolapse, osteoporosis, bilateral  posterior vitreous detachment, bilateral retinal drusen, Sjogren's syndrome, ORIF R finger thumb (for fx, pin removed later), dyspnea on exertion, chest pain on exertion, see chart for more details are also affecting patient's functional outcome.    REHAB POTENTIAL: Good   CLINICAL DECISION MAKING: Stable/uncomplicated   EVALUATION COMPLEXITY: Low     GOALS: Goals reviewed with patient? No   SHORT TERM GOALS: Target date: 01/26/2023   Patient will be independent with initial home exercise program for self-management of symptoms. Baseline: Initial HEP provided at IE (01/12/23); Goal status: MET     LONG TERM GOALS: Target date: 04/06/2023   Patient will be independent with a long-term home exercise program for self-management of symptoms.  Baseline: Initial HEP provided at IE (01/12/23); Goal status: In-progress   2.  Patient will demonstrate improved FOTO to equal or greater than 57 by visit #11 to demonstrate improvement in overall condition and self-reported functional ability.  Baseline: 41 (01/12/23); Goal status: In-progress   3.  Patient will demonstrate improved cervical spine rotation to equal or greater than 60 degrees each direction with no increase in pain to improve her ability to check her blind spot while driving.  Baseline: 29 degrees each direction (01/12/23); Goal status: In-progress   4.  Patient will complete community, work and/or recreational activities with 75% less limitation due to current condition.  Baseline: difficulty with usual activities including crafts, cleaning, driving, sleeping, turning her head, moving her head, reading (01/12/23); Goal status: In-progress   5.  Patient will report pain with functional activity decreased to equal or less than 1/10 to improve her ability to return to making crafts and cleaning with less difficulty.  Baseline: up to 7/10 (01/12/2023);  Goal status: In-progress     PLAN:   PT FREQUENCY: 1-2x/week   PT  DURATION: 12 weeks   PLANNED INTERVENTIONS: Therapeutic exercises, Therapeutic activity, Neuromuscular re-education, Patient/Family education, Self Care, Joint mobilization, Dry Needling, Electrical stimulation, Spinal mobilization, Cryotherapy, Moist heat, Manual therapy, and Re-evaluation   PLAN FOR NEXT SESSION: assess response to repeated motions since last PT session, progress accordingly. Manual therapy as appropriate.      Nancy Nordmann, PT, DPT  01/20/2023, 1:56 PM  [facility] [address] [place], [place] [address] P: [phone] I F: [phone]

## 2023-01-25 ENCOUNTER — Ambulatory Visit: Payer: Medicare Other | Attending: Infectious Diseases

## 2023-01-25 DIAGNOSIS — M62838 Other muscle spasm: Secondary | ICD-10-CM | POA: Insufficient documentation

## 2023-01-25 DIAGNOSIS — M542 Cervicalgia: Secondary | ICD-10-CM | POA: Insufficient documentation

## 2023-01-25 NOTE — Therapy (Signed)
OUTPATIENT PHYSICAL THERAPY TREATMENT NOTE   Patient Name: Barbara Garcia MRN: [id number] DOB:10-09-1941, 82 y.o., female Today's Date: 01/25/2023  PCP: Leonel Ramsay, MD  REFERRING PROVIDER: Leonel Ramsay, MD   END OF SESSION:   PT End of Session - 01/25/23 1737     Visit Number 4    Number of Visits 24    Date for PT Re-Evaluation 04/06/23    Authorization Type MEDICARE PART B reporting period from 01/12/2023    Progress Note Due on Visit 10    PT Start Time 1735    PT Stop Time 1815    PT Time Calculation (min) 40 min    Activity Tolerance Patient tolerated treatment well    Behavior During Therapy WFL for tasks assessed/performed              Past Medical History:  Diagnosis Date   Allergy    Anxiety    Arthritis    Benign essential tremor    Chronic dermatitis    Colon polyps    Combined form of senile cataract    Constipation    GERD (gastroesophageal reflux disease)    Hearing loss    Hemorrhoids    History of cystocele    History of shingles    Hyperglycemia    Hyperlipidemia    Hyperopia with astigmatism and presbyopia    Hypertension    Iris nevus    MVP (mitral valve prolapse)    Osteopenia    Osteoporosis    Posterior vitreous detachment, bilateral    Retinal drusen, bilateral    Sjogrens syndrome ([place])    Vitamin D deficiency    Past Surgical History:  Procedure Laterality Date   ABDOMINAL HYSTERECTOMY     BOTOX INJECTION N/A 10/11/2018   Procedure: BOTOX INJECTION;  Surgeon: Jules Husbands, MD;  Location: [facility];  Service: General;  Laterality: N/A;   COLON SURGERY     hemmhoirdectomy with abcess fistula   COLONOSCOPY W/ BIOPSIES  06/18/2005   COLONOSCOPY WITH PROPOFOL N/A 08/03/2019   Procedure: COLONOSCOPY WITH PROPOFOL;  Surgeon: Lin Landsman, MD;  Location: [facility];  Service: Gastroenterology;  Laterality: N/A;   ESOPHAGOGASTRODUODENOSCOPY (EGD) WITH PROPOFOL N/A 08/03/2019   Procedure:  ESOPHAGOGASTRODUODENOSCOPY (EGD) WITH PROPOFOL;  Surgeon: Lin Landsman, MD;  Location: [facility];  Service: Gastroenterology;  Laterality: N/A;   ORIF FINGER / THUMB FRACTURE Right    pin removed later   TONSILLECTOMY     Patient Active Problem List   Diagnosis Date Noted   Neurodermatitis 10/03/2019   Dyspnea on exertion 07/01/2019   Chest pain on exertion 07/01/2019   Iron deficiency anemia 07/01/2019   Bug bite 05/28/2019   Anemia 05/28/2019   Anal fissure    Rectal fissure 08/24/2018   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   Coronary artery calcification seen on CAT scan 07/28/2018   Globus sensation 02/22/2018   Hyperlipidemia 03/17/2017   Restless legs 03/17/2017   Posterior vitreous detachment, bilateral 09/29/2016   Retinal drusen, bilateral 09/29/2016   Chronic constipation 12/07/2013   Chronic dermatitis 12/07/2013   Combined form of senile cataract 07/26/2012   Hyperopia with astigmatism and presbyopia 07/26/2012   Iris nevus 07/26/2012   Vitamin D deficiency 02/19/2011   Benign essential tremor 11/25/2010   Colon polyps 11/25/2010   GERD (gastroesophageal reflux disease) 11/25/2010   MVP (mitral valve prolapse) 11/25/2010   Mild anxiety 11/25/2010   Osteopenia 11/25/2010   Sjogren's syndrome ([place]) 11/25/2010   History  of shingles 11/25/2010    REFERRING DIAG: neck pain  THERAPY DIAG:  Cervicalgia  Other muscle spasm  Rationale for Evaluation and Treatment: Rehabilitation  PERTINENT HISTORY: Patient is a 82 y.o. female who presents to outpatient physical therapy with a referral for medical diagnosis neck pain and patient preference for MDT certified physical therapist This patient's chief complaints consist of neck pain that radiates to base of scull leading to the following functional deficits: difficulty with usual activities including crafts, cleaning, driving, sleeping, turning her head, moving her head, reading. Relevant past medical history and  comorbidities include anxiety, arthritis, benign essential tremor, cataracts, GERD, hearing loss, history of cystocele, HLD, mitral valve prolapse, osteoporosis, bilateral posterior vitreous detachment, bilateral retinal drusen, Sjogren's syndrome, ORIF R finger thumb (for fx, pin removed later), dyspnea on exertion, chest pain on exertion, see chart for more details.  Patient denies hx of cancer, stroke, seizures, lung problems, diabetes, unexplained weight loss, unexplained changes in bowel or bladder problems, unexplained stumbling or dropping things, and spinal surgery   PRECAUTIONS: osteoporosis  SUBJECTIVE:                                                                                                                                                                                      SUBJECTIVE STATEMENT:  Pt reports 2-3/10 NPS in cervical region. Feels like rotation in her neck is improving but pain is unchanged. Has been compliant with her HEP.   Long term exercise for fitness/health plan/ideas (that patient can see herself doing or expressed she wants to do): walking to the mailbox regularly, walking or using the machines at her senior living community's fitness area regularly. She is not a morning person and likes to be active in the afternoon or evening.     PAIN:  Are you having pain? NPRS 1-2/10 at rest not moving, incrased to 01/24/09 when she turns her head.   OBJECTIVE:   CERVICAL SPINE AROM (measured at start of visit).  *Indicates pain Flexion: 45 (stopped by stiffness) Extension: 40 (stopped by stiffness, and "can feel it").  Side Flexion:       R 20 (limited by pain)      L 20 (limited by pain/stiffness) Rotation:  R 35 (limited by pain) L 34 (limited by pain) Retraction: 0.5 inches (limited by stiffness) Protrusion: 1 inch  (not painful)  ACCESSORY MOTION Tender (concordant) to CPA at upper cervical segments  TODAY'S TREATMENT:    Therapeutic exercise: to  centralize symptoms and improve ROM, strength, muscular endurance, and activity tolerance required for successful completion of functional activities.   - supine deep neck flexor training, 2x12, 2-3 seconds   -  seated cervical rotation SNAGS with towel: 2x12   - seated cervical extension SNAGS with towel: x12   - seated cervical retractions with 2 finger support on chin: 2x10.    - seated suboccipital stretch with hand on occiput and chin: 3x20 seconds.      Cervical AROM post treatment session:    Flexion: 41 deg   Extension: 45 deg   Rotation R/L: 45/35    Manual therapy: to reduce pain and tissue tension, improve range of motion, neuromodulation, in order to promote improved ability to complete functional activities.  SUPINE for ~12 minutes  STM to R sided cervical paraspinals: 3 minutes   R UT side bending stretch with OP at R GHJ: 2x30 secs   R side glides C2-C7, grades 3-4, 5 second bouts for improved R rotations and lateral flexion AROM. 2 bouts/segments  R suboccipital release: 3 minutes total for improving tissue extensibility and pain modulation.    Manual cervical traction: 3x15 seconds.    Pt required multimodal cuing for proper technique and to facilitate improved neuromuscular control, strength, range of motion, and functional ability resulting in improved performance and form.   PATIENT EDUCATION:  Education details: Exercise purpose/form. Self management techniques. HEP including handout  Person educated: Patient Education method: Explanation, Demonstration, Tactile cues, and Verbal cues Education comprehension: verbalized understanding, returned demonstration, verbal cues required, tactile cues required, and needs further education   HOME EXERCISE PROGRAM: Access Code: [email or web address] URL: [email or web address] Date: 01/20/2023 Prepared by: Rosita Kea  Exercises - Seated Posture with Lumbar Roll  - Neck Rotation Stretch  - 3 x daily - 1 sets  - 10 reps - 1 second hold - Supine Deep Neck Flexor Nods  - 1 x daily - 3 sets - 10 reps - 5 seconds hold  [place] at [email or web address]" using code: [email or web address] Suboccipital Stretch -  Repeat 10 Repetitions, Complete 3 Sets, Perform 2 Times a Day   ASSESSMENT:   CLINICAL IMPRESSION: Continuing PT POC with improving cervical pain/mobility and deep neck flexor strength. Pt presents with good tolerance and pain/AROM response post manual intervention and therapeutic exercise. Pt with significant improvements in cervical rotation, flexion, and extension compared to evaluation. Pt remains mostly limited with L cervical rotation. Pt reports decreased stiffness and pain post session. Patient would benefit from continued management of limiting condition by skilled physical therapist to address remaining impairments and functional limitations to work towards stated goals and return to PLOF or maximal functional independence.    From Initial PT Evaluation 01/12/2023: Patient is a 82 y.o. female referred to outpatient physical therapy with a medical diagnosis of neck pain who presents with signs and symptoms consistent with subacute chronic neck pain and stiffness. 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 Patient does demonstrate what appears to be multidirectional joint stiffness in the cervical spine and hypertonic B UT muscles that are likely contributing to her pain experience. Would likely benefit from soft tissue mobilization and/or dry needling as well as MDT approach to help alleviate pain  Patient presents with significant pain, ROM, muscle tension, joint stiffness, motor control, muscle performance (strength/power/endurance) and activity tolerance impairments that are limiting ability to complete her usual activities including crafts, cleaning, driving, sleeping, turning her head,  moving her head, reading without difficulty. Patient will benefit from skilled physical therapy intervention to address current body structure impairments and activity limitations to improve function and work towards goals set in current  POC in order to return to prior level of function or maximal functional improvement.      OBJECTIVE IMPAIRMENTS: decreased activity tolerance, decreased coordination, decreased endurance, decreased knowledge of condition, decreased ROM, decreased strength, hypomobility, impaired perceived functional ability, increased muscle spasms, impaired flexibility, and pain.    ACTIVITY LIMITATIONS: bending and sleeping   PARTICIPATION LIMITATIONS: meal prep, cleaning, laundry, driving, community activity, and   difficulty with usual activities including crafts, cleaning, driving, sleeping, turning her head, moving her head, reading   PERSONAL FACTORS: Age, Time since onset of injury/illness/exacerbation, and 3+ comorbidities:   anxiety, arthritis, benign essential tremor, cataracts, GERD, hearing loss, history of cystocele, HLD, mitral valve prolapse, osteoporosis, bilateral posterior vitreous detachment, bilateral retinal drusen, Sjogren's syndrome, ORIF R finger thumb (for fx, pin removed later), dyspnea on exertion, chest pain on exertion, see chart for more details are also affecting patient's functional outcome.    REHAB POTENTIAL: Good   CLINICAL DECISION MAKING: Stable/uncomplicated   EVALUATION COMPLEXITY: Low     GOALS: Goals reviewed with patient? No   SHORT TERM GOALS: Target date: 01/26/2023   Patient will be independent with initial home exercise program for self-management of symptoms. Baseline: Initial HEP provided at IE (01/12/23); Goal status: MET     LONG TERM GOALS: Target date: 04/06/2023   Patient will be independent with a long-term home exercise program for self-management of symptoms.  Baseline: Initial HEP provided at IE (01/12/23); Goal  status: In-progress   2.  Patient will demonstrate improved FOTO to equal or greater than 57 by visit #11 to demonstrate improvement in overall condition and self-reported functional ability.  Baseline: 41 (01/12/23); Goal status: In-progress   3.  Patient will demonstrate improved cervical spine rotation to equal or greater than 60 degrees each direction with no increase in pain to improve her ability to check her blind spot while driving.  Baseline: 29 degrees each direction (01/12/23); Goal status: In-progress   4.  Patient will complete community, work and/or recreational activities with 75% less limitation due to current condition.  Baseline: difficulty with usual activities including crafts, cleaning, driving, sleeping, turning her head, moving her head, reading (01/12/23); Goal status: In-progress   5.  Patient will report pain with functional activity decreased to equal or less than 1/10 to improve her ability to return to making crafts and cleaning with less difficulty.  Baseline: up to 7/10 (01/12/2023);  Goal status: In-progress     PLAN:   PT FREQUENCY: 1-2x/week   PT DURATION: 12 weeks   PLANNED INTERVENTIONS: Therapeutic exercises, Therapeutic activity, Neuromuscular re-education, Patient/Family education, Self Care, Joint mobilization, Dry Needling, Electrical stimulation, Spinal mobilization, Cryotherapy, Moist heat, Manual therapy, and Re-evaluation   PLAN FOR NEXT SESSION: assess response to repeated motions since last PT session, progress accordingly. Manual therapy as appropriate.      Salem Caster. Fairly IV, PT, DPT Physical Therapist- [facility]  01/25/2023, 6:37 PM

## 2023-01-26 ENCOUNTER — Encounter: Payer: Medicare Other | Admitting: Physical Therapy

## 2023-01-27 ENCOUNTER — Ambulatory Visit: Payer: Medicare Other | Admitting: Physical Therapy

## 2023-01-27 ENCOUNTER — Encounter: Payer: Self-pay | Admitting: Physical Therapy

## 2023-01-27 ENCOUNTER — Encounter: Payer: Medicare Other | Admitting: Physical Therapy

## 2023-01-27 DIAGNOSIS — M62838 Other muscle spasm: Secondary | ICD-10-CM

## 2023-01-27 DIAGNOSIS — M542 Cervicalgia: Secondary | ICD-10-CM

## 2023-01-27 NOTE — Therapy (Signed)
OUTPATIENT PHYSICAL THERAPY TREATMENT NOTE   Patient Name: Barbara Garcia MRN: [id number] DOB:1941/03/04, 82 y.o., female Today's Date: 01/27/2023  PCP: Leonel Ramsay, MD  REFERRING PROVIDER: Leonel Ramsay, MD   END OF SESSION:   PT End of Session - 01/27/23 1307     Visit Number 5    Number of Visits 24    Date for PT Re-Evaluation 04/06/23    Authorization Type MEDICARE PART B reporting period from 01/12/2023    Progress Note Due on Visit 10    PT Start Time 1302    PT Stop Time 1340    PT Time Calculation (min) 38 min    Activity Tolerance Patient tolerated treatment well    Behavior During Therapy WFL for tasks assessed/performed               Past Medical History:  Diagnosis Date   Allergy    Anxiety    Arthritis    Benign essential tremor    Chronic dermatitis    Colon polyps    Combined form of senile cataract    Constipation    GERD (gastroesophageal reflux disease)    Hearing loss    Hemorrhoids    History of cystocele    History of shingles    Hyperglycemia    Hyperlipidemia    Hyperopia with astigmatism and presbyopia    Hypertension    Iris nevus    MVP (mitral valve prolapse)    Osteopenia    Osteoporosis    Posterior vitreous detachment, bilateral    Retinal drusen, bilateral    Sjogrens syndrome ([place])    Vitamin D deficiency    Past Surgical History:  Procedure Laterality Date   ABDOMINAL HYSTERECTOMY     BOTOX INJECTION N/A 10/11/2018   Procedure: BOTOX INJECTION;  Surgeon: Jules Husbands, MD;  Location: [facility];  Service: General;  Laterality: N/A;   COLON SURGERY     hemmhoirdectomy with abcess fistula   COLONOSCOPY W/ BIOPSIES  06/18/2005   COLONOSCOPY WITH PROPOFOL N/A 08/03/2019   Procedure: COLONOSCOPY WITH PROPOFOL;  Surgeon: Lin Landsman, MD;  Location: [facility];  Service: Gastroenterology;  Laterality: N/A;   ESOPHAGOGASTRODUODENOSCOPY (EGD) WITH PROPOFOL N/A 08/03/2019   Procedure:  ESOPHAGOGASTRODUODENOSCOPY (EGD) WITH PROPOFOL;  Surgeon: Lin Landsman, MD;  Location: [place];  Service: Gastroenterology;  Laterality: N/A;   ORIF FINGER / THUMB FRACTURE Right    pin removed later   TONSILLECTOMY     Patient Active Problem List   Diagnosis Date Noted   Neurodermatitis 10/03/2019   Dyspnea on exertion 07/01/2019   Chest pain on exertion 07/01/2019   Iron deficiency anemia 07/01/2019   Bug bite 05/28/2019   Anemia 05/28/2019   Anal fissure    Rectal fissure 08/24/2018   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   Coronary artery calcification seen on CAT scan 07/28/2018   Globus sensation 02/22/2018   Hyperlipidemia 03/17/2017   Restless legs 03/17/2017   Posterior vitreous detachment, bilateral 09/29/2016   Retinal drusen, bilateral 09/29/2016   Chronic constipation 12/07/2013   Chronic dermatitis 12/07/2013   Combined form of senile cataract 07/26/2012   Hyperopia with astigmatism and presbyopia 07/26/2012   Iris nevus 07/26/2012   Vitamin D deficiency 02/19/2011   Benign essential tremor 11/25/2010   Colon polyps 11/25/2010   GERD (gastroesophageal reflux disease) 11/25/2010   MVP (mitral valve prolapse) 11/25/2010   Mild anxiety 11/25/2010   Osteopenia 11/25/2010   Sjogren's syndrome ([place]) 11/25/2010  History of shingles 11/25/2010    REFERRING DIAG: neck pain  THERAPY DIAG:  Cervicalgia  Other muscle spasm  Rationale for Evaluation and Treatment: Rehabilitation  PERTINENT HISTORY: Patient is a 82 y.o. female who presents to outpatient physical therapy with a referral for medical diagnosis neck pain and patient preference for MDT certified physical therapist This patient's chief complaints consist of neck pain that radiates to base of scull leading to the following functional deficits: difficulty with usual activities including crafts, cleaning, driving, sleeping, turning her head, moving her head, reading. Relevant past medical history and  comorbidities include anxiety, arthritis, benign essential tremor, cataracts, GERD, hearing loss, history of cystocele, HLD, mitral valve prolapse, osteoporosis, bilateral posterior vitreous detachment, bilateral retinal drusen, Sjogren's syndrome, ORIF R finger thumb (for fx, pin removed later), dyspnea on exertion, chest pain on exertion, see chart for more details.  Patient denies hx of cancer, stroke, seizures, lung problems, diabetes, unexplained weight loss, unexplained changes in bowel or bladder problems, unexplained stumbling or dropping things, and spinal surgery   PRECAUTIONS: osteoporosis  SUBJECTIVE:                                                                                                                                                                                      SUBJECTIVE STATEMENT:  Patient arrives stating today is "not good" because the rainy weather makes everything feel worse. She states she felt better after last PT session until this morning. She state her HEP has been going fine. She states she took an aleve this morning but cannot tell that it helped.   Long term exercise for fitness/health plan/ideas (that patient can see herself doing or expressed she wants to do): walking to the mailbox regularly, walking or using the machines at her senior living community's fitness area regularly. She is not a morning person and likes to be active in the afternoon or evening.     PAIN:  Are you having pain? NPRS 3-4/10  OBJECTIVE  SELF-REPORTED FUNCTION FOTO score: 43/100 (neck questionnaire)   CERVICAL SPINE AROM (measured at start of visit).  *Indicates pain Flexion: 40  Extension: 50 Side Flexion:       R 30       L 27 Rotation:  R 52 (limited by pain) L 45 (limited by pain)  TODAY'S TREATMENT:    Therapeutic exercise: to centralize symptoms and improve ROM, strength, muscular endurance, and activity tolerance required for successful completion of  functional activities.  - Upper body ergometer level 5 to encourage joint nutrition, warm tissue, induce analgesic effect of aerobic exercise, improve muscular strength and endurance,  and prepare for remainder of session.  5 min total, changing directions every minute. Keeping RPM 50 or higher.  - supine deep neck flexor training, x10, 5 seconds - supine deep neck flexor training with small lift, 2x10 with no hold. Max cuing at first to maintain chin nod.   - seated cervical rotation SNAGS with towel: 1x15 each direction  Manual therapy: to reduce pain and tissue tension, improve range of motion, neuromodulation, in order to promote improved ability to complete functional activities.  SUPINE - STM to R sided cervical paraspinals: ~3 minutes  - R UT side bending stretch with OP at R GHJ: 2x30 secs  - R & L side glides C2-C7, grades 3-4, 5 second bouts for improved R rotations and lateral flexion AROM. 2 bouts/segments - R suboccipital release: ~3 minutes total for improving tissue extensibility and pain modulation.   - Manual cervical traction: 3x15 seconds.  Pt required multimodal cuing for proper technique and to facilitate improved neuromuscular control, strength, range of motion, and functional ability resulting in improved performance and form.   PATIENT EDUCATION:  Education details: Exercise purpose/form. Self management techniques. HEP including handout  Person educated: Patient Education method: Explanation, Demonstration, Tactile cues, and Verbal cues Education comprehension: verbalized understanding, returned demonstration, verbal cues required, tactile cues required, and needs further education   HOME EXERCISE PROGRAM: Access Code: [email or web address] URL: [email or web address] Date: 01/20/2023 Prepared by: Rosita Kea  Exercises - Seated Posture with Lumbar Roll  - Neck Rotation Stretch  - 3 x daily - 1 sets - 10 reps - 1 second hold - Supine Deep Neck Flexor Nods  - 1  x daily - 3 sets - 10 reps - 5 seconds hold  [place] at [email or web address]" using code: [email or web address] Suboccipital Stretch -  Repeat 10 Repetitions, Complete 3 Sets, Perform 2 Times a Day   ASSESSMENT:   CLINICAL IMPRESSION: Patient arrives with report of good response to last PT session. Repeated similar manual interventions with SNAG exercise and deep neck flexion progressions. Also utilized UBE warm up at start of session that decreased pain and stiffness prior to manual. Patient demonstrates significant improvement in ROM since starting PT and since last PT session. Plan to continue with similar interventsions, progressed as appropriate. Patient would benefit from continued management of limiting condition by skilled physical therapist to address remaining impairments and functional limitations to work towards stated goals and return to PLOF or maximal functional independence.   From Initial PT Evaluation 01/12/2023: Patient is a 82 y.o. female referred to outpatient physical therapy with a medical diagnosis of neck pain who presents with signs and symptoms consistent with subacute chronic neck pain and stiffness. 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 Patient does demonstrate what appears to be multidirectional joint stiffness in the cervical spine and hypertonic B UT muscles that are likely contributing to her pain experience. Would likely benefit from soft tissue mobilization and/or dry needling as well as MDT approach to help alleviate pain  Patient presents with significant pain, ROM, muscle tension, joint stiffness, motor control, muscle performance (strength/power/endurance) and activity tolerance impairments that are limiting ability to complete her usual activities including crafts, cleaning, driving, sleeping, turning her head, moving her head, reading without difficulty.  Patient will benefit from skilled physical therapy intervention to address current body structure impairments and activity limitations to improve function and work towards goals set in current POC in order to return to prior level of function or maximal functional improvement.  OBJECTIVE IMPAIRMENTS: decreased activity tolerance, decreased coordination, decreased endurance, decreased knowledge of condition, decreased ROM, decreased strength, hypomobility, impaired perceived functional ability, increased muscle spasms, impaired flexibility, and pain.    ACTIVITY LIMITATIONS: bending and sleeping   PARTICIPATION LIMITATIONS: meal prep, cleaning, laundry, driving, community activity, and   difficulty with usual activities including crafts, cleaning, driving, sleeping, turning her head, moving her head, reading   PERSONAL FACTORS: Age, Time since onset of injury/illness/exacerbation, and 3+ comorbidities:   anxiety, arthritis, benign essential tremor, cataracts, GERD, hearing loss, history of cystocele, HLD, mitral valve prolapse, osteoporosis, bilateral posterior vitreous detachment, bilateral retinal drusen, Sjogren's syndrome, ORIF R finger thumb (for fx, pin removed later), dyspnea on exertion, chest pain on exertion, see chart for more details are also affecting patient's functional outcome.    REHAB POTENTIAL: Good   CLINICAL DECISION MAKING: Stable/uncomplicated   EVALUATION COMPLEXITY: Low     GOALS: Goals reviewed with patient? No   SHORT TERM GOALS: Target date: 01/26/2023   Patient will be independent with initial home exercise program for self-management of symptoms. Baseline: Initial HEP provided at IE (01/12/23); Goal status: MET     LONG TERM GOALS: Target date: 04/06/2023   Patient will be independent with a long-term home exercise program for self-management of symptoms.  Baseline: Initial HEP provided at IE (01/12/23); Goal status: In-progress   2.  Patient will  demonstrate improved FOTO to equal or greater than 57 by visit #11 to demonstrate improvement in overall condition and self-reported functional ability.  Baseline: 41 (01/12/23); 43 at visit #5 (01/30/2023);  Goal status: In-progress   3.  Patient will demonstrate improved cervical spine rotation to equal or greater than 60 degrees each direction with no increase in pain to improve her ability to check her blind spot while driving.  Baseline: 29 degrees each direction (01/12/23); Goal status: In-progress   4.  Patient will complete community, work and/or recreational activities with 75% less limitation due to current condition.  Baseline: difficulty with usual activities including crafts, cleaning, driving, sleeping, turning her head, moving her head, reading (01/12/23); Goal status: In-progress   5.  Patient will report pain with functional activity decreased to equal or less than 1/10 to improve her ability to return to making crafts and cleaning with less difficulty.  Baseline: up to 7/10 (01/12/2023);  Goal status: In-progress     PLAN:   PT FREQUENCY: 1-2x/week   PT DURATION: 12 weeks   PLANNED INTERVENTIONS: Therapeutic exercises, Therapeutic activity, Neuromuscular re-education, Patient/Family education, Self Care, Joint mobilization, Dry Needling, Electrical stimulation, Spinal mobilization, Cryotherapy, Moist heat, Manual therapy, and Re-evaluation   PLAN FOR NEXT SESSION: assess response to repeated motions since last PT session, progress accordingly. Manual therapy as appropriate.      Everlean Alstrom. Graylon Good, PT, DPT 01/27/23, 1:42 PM  [facility] [address] [place], [place] [address] P: [phone] I F: [phone]

## 2023-02-01 ENCOUNTER — Ambulatory Visit: Payer: Medicare Other | Admitting: Physical Therapy

## 2023-02-01 ENCOUNTER — Encounter: Payer: Self-pay | Admitting: Physical Therapy

## 2023-02-01 DIAGNOSIS — M542 Cervicalgia: Secondary | ICD-10-CM

## 2023-02-01 DIAGNOSIS — M62838 Other muscle spasm: Secondary | ICD-10-CM

## 2023-02-01 NOTE — Therapy (Signed)
OUTPATIENT PHYSICAL THERAPY TREATMENT NOTE   Patient Name: Barbara Garcia MRN: [id number] DOB:26-Jun-1941, 82 y.o., female Today's Date: 02/01/2023  PCP: Leonel Ramsay, MD  REFERRING PROVIDER: Leonel Ramsay, MD   END OF SESSION:   PT End of Session - 02/01/23 1926     Visit Number 6    Number of Visits 24    Date for PT Re-Evaluation 04/06/23    Authorization Type MEDICARE PART B reporting period from 01/12/2023    Progress Note Due on Visit 10    PT Start Time 1733    PT Stop Time 1811    PT Time Calculation (min) 38 min    Activity Tolerance Patient tolerated treatment well    Behavior During Therapy WFL for tasks assessed/performed                Past Medical History:  Diagnosis Date   Allergy    Anxiety    Arthritis    Benign essential tremor    Chronic dermatitis    Colon polyps    Combined form of senile cataract    Constipation    GERD (gastroesophageal reflux disease)    Hearing loss    Hemorrhoids    History of cystocele    History of shingles    Hyperglycemia    Hyperlipidemia    Hyperopia with astigmatism and presbyopia    Hypertension    Iris nevus    MVP (mitral valve prolapse)    Osteopenia    Osteoporosis    Posterior vitreous detachment, bilateral    Retinal drusen, bilateral    Sjogrens syndrome ([place])    Vitamin D deficiency    Past Surgical History:  Procedure Laterality Date   ABDOMINAL HYSTERECTOMY     BOTOX INJECTION N/A 10/11/2018   Procedure: BOTOX INJECTION;  Surgeon: Jules Husbands, MD;  Location: [facility];  Service: General;  Laterality: N/A;   COLON SURGERY     hemmhoirdectomy with abcess fistula   COLONOSCOPY W/ BIOPSIES  06/18/2005   COLONOSCOPY WITH PROPOFOL N/A 08/03/2019   Procedure: COLONOSCOPY WITH PROPOFOL;  Surgeon: Lin Landsman, MD;  Location: [facility];  Service: Gastroenterology;  Laterality: N/A;   ESOPHAGOGASTRODUODENOSCOPY (EGD) WITH PROPOFOL N/A 08/03/2019   Procedure:  ESOPHAGOGASTRODUODENOSCOPY (EGD) WITH PROPOFOL;  Surgeon: Lin Landsman, MD;  Location: [place];  Service: Gastroenterology;  Laterality: N/A;   ORIF FINGER / THUMB FRACTURE Right    pin removed later   TONSILLECTOMY     Patient Active Problem List   Diagnosis Date Noted   Neurodermatitis 10/03/2019   Dyspnea on exertion 07/01/2019   Chest pain on exertion 07/01/2019   Iron deficiency anemia 07/01/2019   Bug bite 05/28/2019   Anemia 05/28/2019   Anal fissure    Rectal fissure 08/24/2018   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   Coronary artery calcification seen on CAT scan 07/28/2018   Globus sensation 02/22/2018   Hyperlipidemia 03/17/2017   Restless legs 03/17/2017   Posterior vitreous detachment, bilateral 09/29/2016   Retinal drusen, bilateral 09/29/2016   Chronic constipation 12/07/2013   Chronic dermatitis 12/07/2013   Combined form of senile cataract 07/26/2012   Hyperopia with astigmatism and presbyopia 07/26/2012   Iris nevus 07/26/2012   Vitamin D deficiency 02/19/2011   Benign essential tremor 11/25/2010   Colon polyps 11/25/2010   GERD (gastroesophageal reflux disease) 11/25/2010   MVP (mitral valve prolapse) 11/25/2010   Mild anxiety 11/25/2010   Osteopenia 11/25/2010   Sjogren's syndrome ([place]) 11/25/2010  History of shingles 11/25/2010    REFERRING DIAG: neck pain  THERAPY DIAG:  Cervicalgia  Other muscle spasm  Rationale for Evaluation and Treatment: Rehabilitation  PERTINENT HISTORY: Patient is a 82 y.o. female who presents to outpatient physical therapy with a referral for medical diagnosis neck pain and patient preference for MDT certified physical therapist This patient's chief complaints consist of neck pain that radiates to base of scull leading to the following functional deficits: difficulty with usual activities including crafts, cleaning, driving, sleeping, turning her head, moving her head, reading. Relevant past medical history and  comorbidities include anxiety, arthritis, benign essential tremor, cataracts, GERD, hearing loss, history of cystocele, HLD, mitral valve prolapse, osteoporosis, bilateral posterior vitreous detachment, bilateral retinal drusen, Sjogren's syndrome, ORIF R finger thumb (for fx, pin removed later), dyspnea on exertion, chest pain on exertion, see chart for more details.  Patient denies hx of cancer, stroke, seizures, lung problems, diabetes, unexplained weight loss, unexplained changes in bowel or bladder problems, unexplained stumbling or dropping things, and spinal surgery   PRECAUTIONS: osteoporosis  SUBJECTIVE:                                                                                                                                                                                      SUBJECTIVE STATEMENT:  Patient states she is really tired after visiting all day and driving home from [place] where she stayed with her daughter. She states she did not do her HEP while gone over the weekend. She states her neck is no better since last PT session because she did not do her HEP.   Long term exercise for fitness/health plan/ideas (that patient can see herself doing or expressed she wants to do): walking to the mailbox regularly, walking or using the machines at her senior living community's fitness area regularly. She is not a morning person and likes to be active in the afternoon or evening.     PAIN:  Are you having pain? NPRS 2/10 upper cervical spine.   OBJECTIVE  TODAY'S TREATMENT:    Therapeutic exercise: to centralize symptoms and improve ROM, strength, muscular endurance, and activity tolerance required for successful completion of functional activities.  - Upper body ergometer level 5 to encourage joint nutrition, warm tissue, induce analgesic effect of aerobic exercise, improve muscular strength and endurance,  and prepare for remainder of session. 5 min total, changing directions  every minute. Keeping RPM 50 or higher.  - modified burpee, 1x8 - squat with tap on 17 inch chair to overhead press, 1x10 - standing B scaption to 90 degrees, 1x15 with 3#DB - standing scapular rows, 2x10 - standing cervical rotation SNAGS  with towel: 1x15 each direction  Manual therapy: to reduce pain and tissue tension, improve range of motion, neuromodulation, in order to promote improved ability to complete functional activities.  SUPINE - STM to R sided cervical paraspinals: ~3 minutes  - R UT side bending stretch with OP at R GHJ: 2x30 secs  - R & L side glides C2-C7, grades 3-4, 5 second bouts for improved R rotations and lateral flexion AROM. 1 bouts/segments - R suboccipital release: ~3 minutes total for improving tissue extensibility and pain modulation.   - Manual cervical traction: 3x15 seconds.  Pt required multimodal cuing for proper technique and to facilitate improved neuromuscular control, strength, range of motion, and functional ability resulting in improved performance and form.   PATIENT EDUCATION:  Education details: Exercise purpose/form. Self management techniques. HEP including handout  Person educated: Patient Education method: Explanation, Demonstration, Tactile cues, and Verbal cues Education comprehension: verbalized understanding, returned demonstration, verbal cues required, tactile cues required, and needs further education   HOME EXERCISE PROGRAM: Access Code: [email or web address] URL: [email or web address] Date: 02/01/2023 Prepared by: Rosita Kea  Exercises - Seated Posture with Lumbar Roll  - Neck Rotation Stretch  - 3 x daily - 1 sets - 10 reps - 1 second hold - Supine Deep Neck Flexor Nods  - 1 x daily - 3 sets - 10 reps - 5 seconds hold - Scaption with Dumbbells  - 3 x weekly - 3 sets - 10 reps - 1 second hold - Row with band/cable  - 1 x daily - 3 sets - 10 reps - 2 seconds hold - Seated Assisted Cervical Rotation with Towel  - 1 x daily -  1-2 sets - 15 reps - 2-5 seconds hold  HOME EXERCISE PROGRAM [[email or web address]] View at "[email or web address]" using code: [email or web address] Suboccipital Stretch -  Repeat 10 Repetitions, Complete 3 Sets, Perform 2 Times a Day  HOME EXERCISE PROGRAM [[id number]] View at "[email or web address]" using code: [email or web address]  Modified Burpee -  Repeat 8 Repetitions, Complete 3 Sets, Perform 3 Times a Week  SQUAT OVERHEAD PRESS - DUMBBELL -  Repeat 10 Repetitions, Complete 3 Sets, Perform 3 Times a Day   ASSESSMENT:   CLINICAL IMPRESSION: Patient arrives with report of limited progress since last PT session 2/2 not doing HEP over the weekend while traveling. Today's session focused on providing patient with more general strengthening for overall health and postural strength with update to general and specific HEP. Also continued with manual therapy which resulted in improved cervical spine ROM, especial to the right. Plan to continue with progressive postural and functional strengthening, ROM exercise and manual therapy. Introduced dry needling as an option this session. Patient would benefit from continued management of limiting condition by skilled physical therapist to address remaining impairments and functional limitations to work towards stated goals and return to PLOF or maximal functional independence.   From Initial PT Evaluation 01/12/2023: Patient is a 82 y.o. female referred to outpatient physical therapy with a medical diagnosis of neck pain who presents with signs and symptoms consistent with subacute chronic neck pain and stiffness. 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 Patient does demonstrate what appears to be multidirectional joint stiffness in the cervical spine and hypertonic B UT muscles that are likely contributing to her pain experience. Would likely benefit from soft tissue mobilization and/or dry needling as well  as MDT approach to help alleviate pain  Patient presents with significant pain, ROM, muscle tension, joint  stiffness, motor control, muscle performance (strength/power/endurance) and activity tolerance impairments that are limiting ability to complete her usual activities including crafts, cleaning, driving, sleeping, turning her head, moving her head, reading without difficulty. Patient will benefit from skilled physical therapy intervention to address current body structure impairments and activity limitations to improve function and work towards goals set in current POC in order to return to prior level of function or maximal functional improvement.      OBJECTIVE IMPAIRMENTS: decreased activity tolerance, decreased coordination, decreased endurance, decreased knowledge of condition, decreased ROM, decreased strength, hypomobility, impaired perceived functional ability, increased muscle spasms, impaired flexibility, and pain.    ACTIVITY LIMITATIONS: bending and sleeping   PARTICIPATION LIMITATIONS: meal prep, cleaning, laundry, driving, community activity, and   difficulty with usual activities including crafts, cleaning, driving, sleeping, turning her head, moving her head, reading   PERSONAL FACTORS: Age, Time since onset of injury/illness/exacerbation, and 3+ comorbidities:   anxiety, arthritis, benign essential tremor, cataracts, GERD, hearing loss, history of cystocele, HLD, mitral valve prolapse, osteoporosis, bilateral posterior vitreous detachment, bilateral retinal drusen, Sjogren's syndrome, ORIF R finger thumb (for fx, pin removed later), dyspnea on exertion, chest pain on exertion, see chart for more details are also affecting patient's functional outcome.    REHAB POTENTIAL: Good   CLINICAL DECISION MAKING: Stable/uncomplicated   EVALUATION COMPLEXITY: Low     GOALS: Goals reviewed with patient? No   SHORT TERM GOALS: Target date: 01/26/2023   Patient will be independent with  initial home exercise program for self-management of symptoms. Baseline: Initial HEP provided at IE (01/12/23); Goal status: MET     LONG TERM GOALS: Target date: 04/06/2023   Patient will be independent with a long-term home exercise program for self-management of symptoms.  Baseline: Initial HEP provided at IE (01/12/23); Goal status: In-progress   2.  Patient will demonstrate improved FOTO to equal or greater than 57 by visit #11 to demonstrate improvement in overall condition and self-reported functional ability.  Baseline: 41 (01/12/23); 43 at visit #5 (01/30/2023);  Goal status: In-progress   3.  Patient will demonstrate improved cervical spine rotation to equal or greater than 60 degrees each direction with no increase in pain to improve her ability to check her blind spot while driving.  Baseline: 29 degrees each direction (01/12/23); Goal status: In-progress   4.  Patient will complete community, work and/or recreational activities with 75% less limitation due to current condition.  Baseline: difficulty with usual activities including crafts, cleaning, driving, sleeping, turning her head, moving her head, reading (01/12/23); Goal status: In-progress   5.  Patient will report pain with functional activity decreased to equal or less than 1/10 to improve her ability to return to making crafts and cleaning with less difficulty.  Baseline: up to 7/10 (01/12/2023);  Goal status: In-progress     PLAN:   PT FREQUENCY: 1-2x/week   PT DURATION: 12 weeks   PLANNED INTERVENTIONS: Therapeutic exercises, Therapeutic activity, Neuromuscular re-education, Patient/Family education, Self Care, Joint mobilization, Dry Needling, Electrical stimulation, Spinal mobilization, Cryotherapy, Moist heat, Manual therapy, and Re-evaluation   PLAN FOR NEXT SESSION: assess response to repeated motions since last PT session, progress accordingly. Manual therapy as appropriate.      Everlean Alstrom. Graylon Good, PT,  DPT 02/01/23, 7:27 PM  [facility] [address] [place], [place] [address] P: [phone] I F: [phone]

## 2023-02-02 ENCOUNTER — Encounter: Payer: Medicare Other | Admitting: Physical Therapy

## 2023-02-03 ENCOUNTER — Ambulatory Visit: Payer: Medicare Other | Admitting: Physical Therapy

## 2023-02-09 ENCOUNTER — Ambulatory Visit: Payer: Medicare Other | Admitting: Physical Therapy

## 2023-02-09 ENCOUNTER — Encounter: Payer: Self-pay | Admitting: Physical Therapy

## 2023-02-09 DIAGNOSIS — M62838 Other muscle spasm: Secondary | ICD-10-CM

## 2023-02-09 DIAGNOSIS — M542 Cervicalgia: Secondary | ICD-10-CM | POA: Diagnosis not present

## 2023-02-09 NOTE — Therapy (Signed)
OUTPATIENT PHYSICAL THERAPY TREATMENT NOTE   Patient Name: Barbara Garcia MRN: [id number] DOB:05-07-1941, 82 y.o., female Today's Date: 02/09/2023  PCP: Leonel Ramsay, MD  REFERRING PROVIDER: Leonel Ramsay, MD   END OF SESSION:   PT End of Session - 02/09/23 1350     Visit Number 7    Number of Visits 24    Date for PT Re-Evaluation 04/06/23    Authorization Type MEDICARE PART B reporting period from 01/12/2023    Progress Note Due on Visit 10    PT Start Time 1345    PT Stop Time 1425    PT Time Calculation (min) 40 min    Activity Tolerance Patient tolerated treatment well    Behavior During Therapy WFL for tasks assessed/performed                 Past Medical History:  Diagnosis Date   Allergy    Anxiety    Arthritis    Benign essential tremor    Chronic dermatitis    Colon polyps    Combined form of senile cataract    Constipation    GERD (gastroesophageal reflux disease)    Hearing loss    Hemorrhoids    History of cystocele    History of shingles    Hyperglycemia    Hyperlipidemia    Hyperopia with astigmatism and presbyopia    Hypertension    Iris nevus    MVP (mitral valve prolapse)    Osteopenia    Osteoporosis    Posterior vitreous detachment, bilateral    Retinal drusen, bilateral    Sjogrens syndrome ([place])    Vitamin D deficiency    Past Surgical History:  Procedure Laterality Date   ABDOMINAL HYSTERECTOMY     BOTOX INJECTION N/A 10/11/2018   Procedure: BOTOX INJECTION;  Surgeon: Jules Husbands, MD;  Location: [facility];  Service: General;  Laterality: N/A;   COLON SURGERY     hemmhoirdectomy with abcess fistula   COLONOSCOPY W/ BIOPSIES  06/18/2005   COLONOSCOPY WITH PROPOFOL N/A 08/03/2019   Procedure: COLONOSCOPY WITH PROPOFOL;  Surgeon: Lin Landsman, MD;  Location: [facility];  Service: Gastroenterology;  Laterality: N/A;   ESOPHAGOGASTRODUODENOSCOPY (EGD) WITH PROPOFOL N/A 08/03/2019   Procedure:  ESOPHAGOGASTRODUODENOSCOPY (EGD) WITH PROPOFOL;  Surgeon: Lin Landsman, MD;  Location: [place];  Service: Gastroenterology;  Laterality: N/A;   ORIF FINGER / THUMB FRACTURE Right    pin removed later   TONSILLECTOMY     Patient Active Problem List   Diagnosis Date Noted   Neurodermatitis 10/03/2019   Dyspnea on exertion 07/01/2019   Chest pain on exertion 07/01/2019   Iron deficiency anemia 07/01/2019   Bug bite 05/28/2019   Anemia 05/28/2019   Anal fissure    Rectal fissure 08/24/2018   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   Coronary artery calcification seen on CAT scan 07/28/2018   Globus sensation 02/22/2018   Hyperlipidemia 03/17/2017   Restless legs 03/17/2017   Posterior vitreous detachment, bilateral 09/29/2016   Retinal drusen, bilateral 09/29/2016   Chronic constipation 12/07/2013   Chronic dermatitis 12/07/2013   Combined form of senile cataract 07/26/2012   Hyperopia with astigmatism and presbyopia 07/26/2012   Iris nevus 07/26/2012   Vitamin D deficiency 02/19/2011   Benign essential tremor 11/25/2010   Colon polyps 11/25/2010   GERD (gastroesophageal reflux disease) 11/25/2010   MVP (mitral valve prolapse) 11/25/2010   Mild anxiety 11/25/2010   Osteopenia 11/25/2010   Sjogren's syndrome ([place]) 11/25/2010  History of shingles 11/25/2010    REFERRING DIAG: neck pain  THERAPY DIAG:  Cervicalgia  Other muscle spasm  Rationale for Evaluation and Treatment: Rehabilitation  PERTINENT HISTORY: Patient is a 82 y.o. female who presents to outpatient physical therapy with a referral for medical diagnosis neck pain and patient preference for MDT certified physical therapist This patient's chief complaints consist of neck pain that radiates to base of scull leading to the following functional deficits: difficulty with usual activities including crafts, cleaning, driving, sleeping, turning her head, moving her head, reading. Relevant past medical history and  comorbidities include anxiety, arthritis, benign essential tremor, cataracts, GERD, hearing loss, history of cystocele, HLD, mitral valve prolapse, osteoporosis, bilateral posterior vitreous detachment, bilateral retinal drusen, Sjogren's syndrome, ORIF R finger thumb (for fx, pin removed later), dyspnea on exertion, chest pain on exertion, see chart for more details.  Patient denies hx of cancer, stroke, seizures, lung problems, diabetes, unexplained weight loss, unexplained changes in bowel or bladder problems, unexplained stumbling or dropping things, and spinal surgery   PRECAUTIONS: osteoporosis  SUBJECTIVE:                                                                                                                                                                                      SUBJECTIVE STATEMENT:  Patient states she missed her appointment last week because she has had vertigo since last PT session. It got so bad that her daughter helped her with Epley maneuver over Facetime yesterday. She states she has been unable to do her HEP since last week and that has taken some "starch out of me." She states today she does not feel dizzy today but still feels like she is in a "fog." She reports she has pain in her neck up to 3/10 when she turns her head any direction.   Long term exercise for fitness/health plan/ideas (that patient can see herself doing or expressed she wants to do): walking to the mailbox regularly, walking or using the machines at her senior living community's fitness area regularly. She is not a morning person and likes to be active in the afternoon or evening.     PAIN:  Are you having pain? NPRS 3/10 lower cervical spine.   OBJECTIVE  TODAY'S TREATMENT:    Therapeutic exercise: to centralize symptoms and improve ROM, strength, muscular endurance, and activity tolerance required for successful completion of functional activities.  - Upper body ergometer level 5 to  encourage joint nutrition, warm tissue, induce analgesic effect of aerobic exercise, improve muscular strength and endurance,  and prepare for remainder of session. 5 min total, changing directions every minute. Keeping RPM 50 or  higher.  (Manual therapy - see below) - seated cervical rotation SNAGS with pillow case: 2x10 each direction - seated cervical extension SNAG using pillow case, 2x10  - standing doorway pec stretch, 3x30 seconds - standing wall angels 1x5 (discontinued due to discomfort at low back, mid back, and upper arms).   Superset: - standing scapular rows, 2x10 at 15# cable.  - standing AROM cervical spine rotation, 2x10 each direction.   Manual therapy: to reduce pain and tissue tension, improve range of motion, neuromodulation, in order to promote improved ability to complete functional activities. IN MASSAGE CHAIR - STM to bilateral UT and posterior cervical spine musculature.  - gentle CPA grade I-II along upper thoracic and cervical spine segments.  - provided education on dry needling while completing manual therapy.   Pt required multimodal cuing for proper technique and to facilitate improved neuromuscular control, strength, range of motion, and functional ability resulting in improved performance and form.   PATIENT EDUCATION:  Education details: Exercise purpose/form. Self management techniques. HEP including handout  Person educated: Patient Education method: Explanation, Demonstration, Tactile cues, and Verbal cues Education comprehension: verbalized understanding, returned demonstration, verbal cues required, tactile cues required, and needs further education   HOME EXERCISE PROGRAM: Access Code: [email or web address] URL: [email or web address] Date: 02/01/2023 Prepared by: Rosita Kea  Exercises - Seated Posture with Lumbar Roll  - Neck Rotation Stretch  - 3 x daily - 1 sets - 10 reps - 1 second hold - Supine Deep Neck Flexor Nods  - 1 x daily - 3 sets  - 10 reps - 5 seconds hold - Scaption with Dumbbells  - 3 x weekly - 3 sets - 10 reps - 1 second hold - Row with band/cable  - 1 x daily - 3 sets - 10 reps - 2 seconds hold - Seated Assisted Cervical Rotation with Towel  - 1 x daily - 1-2 sets - 15 reps - 2-5 seconds hold  HOME EXERCISE PROGRAM [[email or web address]] View at "[email or web address]" using code: [email or web address] Suboccipital Stretch -  Repeat 10 Repetitions, Complete 3 Sets, Perform 2 Times a Day  HOME EXERCISE PROGRAM [[id number]] View at "[email or web address]" using code: [email or web address]  Modified [place] -  Repeat 8 Repetitions, Complete 3 Sets, Perform 3 Times a Week  SQUAT OVERHEAD PRESS - DUMBBELL -  Repeat 10 Repetitions, Complete 3 Sets, Perform 3 Times a Day   ASSESSMENT:   CLINICAL IMPRESSION: Patient arrives with report of increased stiffness since last PT session after which she experienced a bout of vertigo that lasted until yesterday when her daughter helped her through epley maneuver over facetime. Today's session limited supine or coupled extension/rotation positions of the head to limit chances of recurrent vertigo since patient is still feeling a little off. Continued with interventions to improve posture and cervicothoracic ROM and strength. Patient appeared to tolerate exercises well. Patient would benefit from continued management of limiting condition by skilled physical therapist to address remaining impairments and functional limitations to work towards stated goals and return to PLOF or maximal functional independence.   From Initial PT Evaluation 01/12/2023: Patient is a 82 y.o. female referred to outpatient physical therapy with a medical diagnosis of neck pain who presents with signs and symptoms consistent with subacute chronic neck pain and stiffness. 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 Patient does demonstrate what appears to be  multidirectional joint stiffness in the cervical spine and hypertonic B UT muscles that are likely contributing to her  pain experience. Would likely benefit from soft tissue mobilization and/or dry needling as well as MDT approach to help alleviate pain  Patient presents with significant pain, ROM, muscle tension, joint stiffness, motor control, muscle performance (strength/power/endurance) and activity tolerance impairments that are limiting ability to complete her usual activities including crafts, cleaning, driving, sleeping, turning her head, moving her head, reading without difficulty. Patient will benefit from skilled physical therapy intervention to address current body structure impairments and activity limitations to improve function and work towards goals set in current POC in order to return to prior level of function or maximal functional improvement.      OBJECTIVE IMPAIRMENTS: decreased activity tolerance, decreased coordination, decreased endurance, decreased knowledge of condition, decreased ROM, decreased strength, hypomobility, impaired perceived functional ability, increased muscle spasms, impaired flexibility, and pain.    ACTIVITY LIMITATIONS: bending and sleeping   PARTICIPATION LIMITATIONS: meal prep, cleaning, laundry, driving, community activity, and   difficulty with usual activities including crafts, cleaning, driving, sleeping, turning her head, moving her head, reading   PERSONAL FACTORS: Age, Time since onset of injury/illness/exacerbation, and 3+ comorbidities:   anxiety, arthritis, benign essential tremor, cataracts, GERD, hearing loss, history of cystocele, HLD, mitral valve prolapse, osteoporosis, bilateral posterior vitreous detachment, bilateral retinal drusen, Sjogren's syndrome, ORIF R finger thumb (for fx, pin removed later), dyspnea on exertion, chest pain on exertion, see chart for more details are also affecting patient's functional outcome.    REHAB POTENTIAL:  Good   CLINICAL DECISION MAKING: Stable/uncomplicated   EVALUATION COMPLEXITY: Low     GOALS: Goals reviewed with patient? No   SHORT TERM GOALS: Target date: 01/26/2023   Patient will be independent with initial home exercise program for self-management of symptoms. Baseline: Initial HEP provided at IE (01/12/23); Goal status: MET     LONG TERM GOALS: Target date: 04/06/2023   Patient will be independent with a long-term home exercise program for self-management of symptoms.  Baseline: Initial HEP provided at IE (01/12/23); Goal status: In-progress   2.  Patient will demonstrate improved FOTO to equal or greater than 57 by visit #11 to demonstrate improvement in overall condition and self-reported functional ability.  Baseline: 41 (01/12/23); 43 at visit #5 (01/30/2023);  Goal status: In-progress   3.  Patient will demonstrate improved cervical spine rotation to equal or greater than 60 degrees each direction with no increase in pain to improve her ability to check her blind spot while driving.  Baseline: 29 degrees each direction (01/12/23); Goal status: In-progress   4.  Patient will complete community, work and/or recreational activities with 75% less limitation due to current condition.  Baseline: difficulty with usual activities including crafts, cleaning, driving, sleeping, turning her head, moving her head, reading (01/12/23); Goal status: In-progress   5.  Patient will report pain with functional activity decreased to equal or less than 1/10 to improve her ability to return to making crafts and cleaning with less difficulty.  Baseline: up to 7/10 (01/12/2023);  Goal status: In-progress     PLAN:   PT FREQUENCY: 1-2x/week   PT DURATION: 12 weeks   PLANNED INTERVENTIONS: Therapeutic exercises, Therapeutic activity, Neuromuscular re-education, Patient/Family education, Self Care, Joint mobilization, Dry Needling, Electrical stimulation, Spinal mobilization, Cryotherapy,  Moist heat, Manual therapy, and Re-evaluation   PLAN FOR NEXT SESSION: assess response to repeated motions since last PT session, progress accordingly. Manual therapy as appropriate.    Everlean Alstrom. Graylon Good, PT, DPT 02/09/23, 2:32 PM  [address]  [place], [place] [address] P: [phone] I F: [phone]

## 2023-02-11 ENCOUNTER — Ambulatory Visit: Payer: Medicare Other | Admitting: Physical Therapy

## 2023-02-15 ENCOUNTER — Encounter: Payer: Self-pay | Admitting: Physical Therapy

## 2023-02-15 ENCOUNTER — Ambulatory Visit: Payer: Medicare Other | Admitting: Physical Therapy

## 2023-02-15 DIAGNOSIS — M542 Cervicalgia: Secondary | ICD-10-CM

## 2023-02-15 DIAGNOSIS — M62838 Other muscle spasm: Secondary | ICD-10-CM

## 2023-02-15 NOTE — Therapy (Signed)
OUTPATIENT PHYSICAL THERAPY TREATMENT NOTE   Patient Name: Barbara Garcia MRN: [id number] DOB:12-08-1940, 82 y.o., female Today's Date: 02/15/2023  PCP: Leonel Ramsay, MD  REFERRING PROVIDER: Leonel Ramsay, MD   END OF SESSION:   PT End of Session - 02/15/23 1039     Visit Number 8    Number of Visits 24    Date for PT Re-Evaluation 04/06/23    Authorization Type MEDICARE PART B reporting period from 01/12/2023    Progress Note Due on Visit 10    PT Start Time 1035    PT Stop Time 1113    PT Time Calculation (min) 38 min    Activity Tolerance Patient tolerated treatment well    Behavior During Therapy WFL for tasks assessed/performed                  Past Medical History:  Diagnosis Date   Allergy    Anxiety    Arthritis    Benign essential tremor    Chronic dermatitis    Colon polyps    Combined form of senile cataract    Constipation    GERD (gastroesophageal reflux disease)    Hearing loss    Hemorrhoids    History of cystocele    History of shingles    Hyperglycemia    Hyperlipidemia    Hyperopia with astigmatism and presbyopia    Hypertension    Iris nevus    MVP (mitral valve prolapse)    Osteopenia    Osteoporosis    Posterior vitreous detachment, bilateral    Retinal drusen, bilateral    Sjogrens syndrome ([place])    Vitamin D deficiency    Past Surgical History:  Procedure Laterality Date   ABDOMINAL HYSTERECTOMY     BOTOX INJECTION N/A 10/11/2018   Procedure: BOTOX INJECTION;  Surgeon: Jules Husbands, MD;  Location: [facility];  Service: General;  Laterality: N/A;   COLON SURGERY     hemmhoirdectomy with abcess fistula   COLONOSCOPY W/ BIOPSIES  06/18/2005   COLONOSCOPY WITH PROPOFOL N/A 08/03/2019   Procedure: COLONOSCOPY WITH PROPOFOL;  Surgeon: Lin Landsman, MD;  Location: [facility];  Service: Gastroenterology;  Laterality: N/A;   ESOPHAGOGASTRODUODENOSCOPY (EGD) WITH PROPOFOL N/A 08/03/2019   Procedure:  ESOPHAGOGASTRODUODENOSCOPY (EGD) WITH PROPOFOL;  Surgeon: Lin Landsman, MD;  Location: [place];  Service: Gastroenterology;  Laterality: N/A;   ORIF FINGER / THUMB FRACTURE Right    pin removed later   TONSILLECTOMY     Patient Active Problem List   Diagnosis Date Noted   Neurodermatitis 10/03/2019   Dyspnea on exertion 07/01/2019   Chest pain on exertion 07/01/2019   Iron deficiency anemia 07/01/2019   Bug bite 05/28/2019   Anemia 05/28/2019   Anal fissure    Rectal fissure 08/24/2018   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   Coronary artery calcification seen on CAT scan 07/28/2018   Globus sensation 02/22/2018   Hyperlipidemia 03/17/2017   Restless legs 03/17/2017   Posterior vitreous detachment, bilateral 09/29/2016   Retinal drusen, bilateral 09/29/2016   Chronic constipation 12/07/2013   Chronic dermatitis 12/07/2013   Combined form of senile cataract 07/26/2012   Hyperopia with astigmatism and presbyopia 07/26/2012   Iris nevus 07/26/2012   Vitamin D deficiency 02/19/2011   Benign essential tremor 11/25/2010   Colon polyps 11/25/2010   GERD (gastroesophageal reflux disease) 11/25/2010   MVP (mitral valve prolapse) 11/25/2010   Mild anxiety 11/25/2010   Osteopenia 11/25/2010   Sjogren's syndrome ([place])  11/25/2010   History of shingles 11/25/2010    REFERRING DIAG: neck pain  THERAPY DIAG:  Cervicalgia  Other muscle spasm  Rationale for Evaluation and Treatment: Rehabilitation  PERTINENT HISTORY: Patient is a 82 y.o. female who presents to outpatient physical therapy with a referral for medical diagnosis neck pain and patient preference for MDT certified physical therapist This patient's chief complaints consist of neck pain that radiates to base of scull leading to the following functional deficits: difficulty with usual activities including crafts, cleaning, driving, sleeping, turning her head, moving her head, reading. Relevant past medical history and  comorbidities include anxiety, arthritis, benign essential tremor, cataracts, GERD, hearing loss, history of cystocele, HLD, mitral valve prolapse, osteoporosis, bilateral posterior vitreous detachment, bilateral retinal drusen, Sjogren's syndrome, ORIF R finger thumb (for fx, pin removed later), dyspnea on exertion, chest pain on exertion, see chart for more details.  Patient denies hx of cancer, stroke, seizures, lung problems, diabetes, unexplained weight loss, unexplained changes in bowel or bladder problems, unexplained stumbling or dropping things, and spinal surgery   PRECAUTIONS: osteoporosis  SUBJECTIVE:                                                                                                                                                                                      SUBJECTIVE STATEMENT:  Patient states she is feeling better today. Her dizziness has cleared up. She states she has not been focusing on exercises lately because she has been so busy doing housework.   Long term exercise for fitness/health plan/ideas (that patient can see herself doing or expressed she wants to do): walking to the mailbox regularly, walking or using the machines at her senior living community's fitness area regularly. She is not a morning person and likes to be active in the afternoon or evening.     PAIN:  Are you having pain? NPRS 0/10 in base of neck  OBJECTIVE  TODAY'S TREATMENT:    Therapeutic exercise: to centralize symptoms and improve ROM, strength, muscular endurance, and activity tolerance required for successful completion of functional activities.  - Upper body ergometer level 5-7 to encourage joint nutrition, warm tissue, induce analgesic effect of aerobic exercise, improve muscular strength and endurance,  and prepare for remainder of session. 5 min total, changing directions every minute. Keeping RPM 50 or higher.  - seated thoracic extension over back of chair with half foam  roll again upper back, hands behind head. 2x10 with 5 second hold.  - standing facing away from TRX, step to mini-lunge to warrior one rotation, then rotation towards front foot dropping ipsilateral arm to stretch thoracic and cervical spine rotation. 2x10 each side  alternating sides each rep. CGA-SBA for safety.  - standing prayer stretch for thoracic extension, 1x10 with 5 second hold plus more reps to learn exercise.   (Manual therapy - see below)  - seated cervical rotation SNAGS with pillow case: 1x10 each direction  Manual therapy: to reduce pain and tissue tension, improve range of motion, neuromodulation, in order to promote improved ability to complete functional activities. IN MASSAGE CHAIR - STM to bilateral UT and posterior cervical spine musculature.   Pt required multimodal cuing for proper technique and to facilitate improved neuromuscular control, strength, range of motion, and functional ability resulting in improved performance and form.   PATIENT EDUCATION:  Education details: Exercise purpose/form. Self management techniques. HEP including handout  Person educated: Patient Education method: Explanation, Demonstration, Tactile cues, and Verbal cues Education comprehension: verbalized understanding, returned demonstration, verbal cues required, tactile cues required, and needs further education   HOME EXERCISE PROGRAM: Access Code: [email or web address] URL: [email or web address] Date: 02/01/2023 Prepared by: Rosita Kea  Exercises - Seated Posture with Lumbar Roll  - Neck Rotation Stretch  - 3 x daily - 1 sets - 10 reps - 1 second hold - Supine Deep Neck Flexor Nods  - 1 x daily - 3 sets - 10 reps - 5 seconds hold - Scaption with Dumbbells  - 3 x weekly - 3 sets - 10 reps - 1 second hold - Row with band/cable  - 1 x daily - 3 sets - 10 reps - 2 seconds hold - Seated Assisted Cervical Rotation with Towel  - 1 x daily - 1-2 sets - 15 reps - 2-5 seconds hold  HOME  EXERCISE PROGRAM [[email or web address]] View at "[email or web address]" using code: [email or web address] Suboccipital Stretch -  Repeat 10 Repetitions, Complete 3 Sets, Perform 2 Times a Day  HOME EXERCISE PROGRAM [[id number]] View at "[email or web address]" using code: [email or web address]  Modified [place] -  Repeat 8 Repetitions, Complete 3 Sets, Perform 3 Times a Week  SQUAT OVERHEAD PRESS - DUMBBELL -  Repeat 10 Repetitions, Complete 3 Sets, Perform 3 Times a Day   ASSESSMENT:   CLINICAL IMPRESSION: Patient arrives reporting no pain but still stiff with rotation. Continued with exercises to improve ROM and decrease muscle and joint stiffness and pain. Patient was very tight over B UT but improved ROM after manual therapy and SNAG exercise. Patient would benefit from continued management of limiting condition by skilled physical therapist to address remaining impairments and functional limitations to work towards stated goals and return to PLOF or maximal functional independence.    From Initial PT Evaluation 01/12/2023: Patient is a 83 y.o. female referred to outpatient physical therapy with a medical diagnosis of neck pain who presents with signs and symptoms consistent with subacute chronic neck pain and stiffness. 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 Patient does demonstrate what appears to be multidirectional joint stiffness in the cervical spine and hypertonic B UT muscles that are likely contributing to her pain experience. Would likely benefit from soft tissue mobilization and/or dry needling as well as MDT approach to help alleviate pain  Patient presents with significant pain, ROM, muscle tension, joint stiffness, motor control, muscle performance (strength/power/endurance) and activity tolerance impairments that are limiting ability to complete her usual activities including crafts, cleaning, driving, sleeping, turning her head,  moving her head, reading without difficulty. Patient will benefit from skilled physical therapy intervention to address current body structure impairments and activity limitations to improve function and work towards  goals set in current POC in order to return to prior level of function or maximal functional improvement.      OBJECTIVE IMPAIRMENTS: decreased activity tolerance, decreased coordination, decreased endurance, decreased knowledge of condition, decreased ROM, decreased strength, hypomobility, impaired perceived functional ability, increased muscle spasms, impaired flexibility, and pain.    ACTIVITY LIMITATIONS: bending and sleeping   PARTICIPATION LIMITATIONS: meal prep, cleaning, laundry, driving, community activity, and   difficulty with usual activities including crafts, cleaning, driving, sleeping, turning her head, moving her head, reading   PERSONAL FACTORS: Age, Time since onset of injury/illness/exacerbation, and 3+ comorbidities:   anxiety, arthritis, benign essential tremor, cataracts, GERD, hearing loss, history of cystocele, HLD, mitral valve prolapse, osteoporosis, bilateral posterior vitreous detachment, bilateral retinal drusen, Sjogren's syndrome, ORIF R finger thumb (for fx, pin removed later), dyspnea on exertion, chest pain on exertion, see chart for more details are also affecting patient's functional outcome.    REHAB POTENTIAL: Good   CLINICAL DECISION MAKING: Stable/uncomplicated   EVALUATION COMPLEXITY: Low     GOALS: Goals reviewed with patient? No   SHORT TERM GOALS: Target date: 01/26/2023   Patient will be independent with initial home exercise program for self-management of symptoms. Baseline: Initial HEP provided at IE (01/12/23); Goal status: MET     LONG TERM GOALS: Target date: 04/06/2023   Patient will be independent with a long-term home exercise program for self-management of symptoms.  Baseline: Initial HEP provided at IE (01/12/23); Goal  status: In-progress   2.  Patient will demonstrate improved FOTO to equal or greater than 57 by visit #11 to demonstrate improvement in overall condition and self-reported functional ability.  Baseline: 41 (01/12/23); 43 at visit #5 (01/30/2023);  Goal status: In-progress   3.  Patient will demonstrate improved cervical spine rotation to equal or greater than 60 degrees each direction with no increase in pain to improve her ability to check her blind spot while driving.  Baseline: 29 degrees each direction (01/12/23); Goal status: In-progress   4.  Patient will complete community, work and/or recreational activities with 75% less limitation due to current condition.  Baseline: difficulty with usual activities including crafts, cleaning, driving, sleeping, turning her head, moving her head, reading (01/12/23); Goal status: In-progress   5.  Patient will report pain with functional activity decreased to equal or less than 1/10 to improve her ability to return to making crafts and cleaning with less difficulty.  Baseline: up to 7/10 (01/12/2023);  Goal status: In-progress     PLAN:   PT FREQUENCY: 1-2x/week   PT DURATION: 12 weeks   PLANNED INTERVENTIONS: Therapeutic exercises, Therapeutic activity, Neuromuscular re-education, Patient/Family education, Self Care, Joint mobilization, Dry Needling, Electrical stimulation, Spinal mobilization, Cryotherapy, Moist heat, Manual therapy, and Re-evaluation   PLAN FOR NEXT SESSION: assess response to repeated motions since last PT session, progress accordingly. Manual therapy as appropriate.    Everlean Alstrom. Graylon Good, PT, DPT 02/15/23, 11:17 AM  [facility] [address] [place], [place] [address] P: [phone] I F: [phone]

## 2023-02-16 ENCOUNTER — Encounter: Payer: Medicare Other | Admitting: Physical Therapy

## 2023-02-17 ENCOUNTER — Encounter: Payer: Self-pay | Admitting: Physical Therapy

## 2023-02-17 ENCOUNTER — Ambulatory Visit: Payer: Medicare Other | Admitting: Physical Therapy

## 2023-02-17 ENCOUNTER — Encounter: Payer: Medicare Other | Admitting: Physical Therapy

## 2023-02-17 DIAGNOSIS — M542 Cervicalgia: Secondary | ICD-10-CM

## 2023-02-17 DIAGNOSIS — M62838 Other muscle spasm: Secondary | ICD-10-CM

## 2023-02-17 NOTE — Therapy (Signed)
OUTPATIENT PHYSICAL THERAPY TREATMENT NOTE   Patient Name: Barbara Garcia MRN: [id number] DOB:1941-05-29, 82 y.o., female Today's Date: 02/17/2023  PCP: Leonel Ramsay, MD  REFERRING PROVIDER: Leonel Ramsay, MD   END OF SESSION:   PT End of Session - 02/17/23 1312     Visit Number 9    Number of Visits 24    Date for PT Re-Evaluation 04/06/23    Authorization Type MEDICARE PART B reporting period from 01/12/2023    Progress Note Due on Visit 10    PT Start Time 1309    PT Stop Time [id number]    PT Time Calculation (min) 38 min    Activity Tolerance Patient tolerated treatment well    Behavior During Therapy WFL for tasks assessed/performed                  Past Medical History:  Diagnosis Date   Allergy    Anxiety    Arthritis    Benign essential tremor    Chronic dermatitis    Colon polyps    Combined form of senile cataract    Constipation    GERD (gastroesophageal reflux disease)    Hearing loss    Hemorrhoids    History of cystocele    History of shingles    Hyperglycemia    Hyperlipidemia    Hyperopia with astigmatism and presbyopia    Hypertension    Iris nevus    MVP (mitral valve prolapse)    Osteopenia    Osteoporosis    Posterior vitreous detachment, bilateral    Retinal drusen, bilateral    Sjogrens syndrome ([place])    Vitamin D deficiency    Past Surgical History:  Procedure Laterality Date   ABDOMINAL HYSTERECTOMY     BOTOX INJECTION N/A 10/11/2018   Procedure: BOTOX INJECTION;  Surgeon: Jules Husbands, MD;  Location: [facility];  Service: General;  Laterality: N/A;   COLON SURGERY     hemmhoirdectomy with abcess fistula   COLONOSCOPY W/ BIOPSIES  06/18/2005   COLONOSCOPY WITH PROPOFOL N/A 08/03/2019   Procedure: COLONOSCOPY WITH PROPOFOL;  Surgeon: Lin Landsman, MD;  Location: [facility];  Service: Gastroenterology;  Laterality: N/A;   ESOPHAGOGASTRODUODENOSCOPY (EGD) WITH PROPOFOL N/A 08/03/2019   Procedure:  ESOPHAGOGASTRODUODENOSCOPY (EGD) WITH PROPOFOL;  Surgeon: Lin Landsman, MD;  Location: [facility];  Service: Gastroenterology;  Laterality: N/A;   ORIF FINGER / THUMB FRACTURE Right    pin removed later   TONSILLECTOMY     Patient Active Problem List   Diagnosis Date Noted   Neurodermatitis 10/03/2019   Dyspnea on exertion 07/01/2019   Chest pain on exertion 07/01/2019   Iron deficiency anemia 07/01/2019   Bug bite 05/28/2019   Anemia 05/28/2019   Anal fissure    Rectal fissure 08/24/2018   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   Coronary artery calcification seen on CAT scan 07/28/2018   Globus sensation 02/22/2018   Hyperlipidemia 03/17/2017   Restless legs 03/17/2017   Posterior vitreous detachment, bilateral 09/29/2016   Retinal drusen, bilateral 09/29/2016   Chronic constipation 12/07/2013   Chronic dermatitis 12/07/2013   Combined form of senile cataract 07/26/2012   Hyperopia with astigmatism and presbyopia 07/26/2012   Iris nevus 07/26/2012   Vitamin D deficiency 02/19/2011   Benign essential tremor 11/25/2010   Colon polyps 11/25/2010   GERD (gastroesophageal reflux disease) 11/25/2010   MVP (mitral valve prolapse) 11/25/2010   Mild anxiety 11/25/2010   Osteopenia 11/25/2010   Sjogren's syndrome ([place])  11/25/2010   History of shingles 11/25/2010    REFERRING DIAG: neck pain  THERAPY DIAG:  Cervicalgia  Other muscle spasm  Rationale for Evaluation and Treatment: Rehabilitation  PERTINENT HISTORY: Patient is a 82 y.o. female who presents to outpatient physical therapy with a referral for medical diagnosis neck pain and patient preference for MDT certified physical therapist This patient's chief complaints consist of neck pain that radiates to base of scull leading to the following functional deficits: difficulty with usual activities including crafts, cleaning, driving, sleeping, turning her head, moving her head, reading. Relevant past medical history and  comorbidities include anxiety, arthritis, benign essential tremor, cataracts, GERD, hearing loss, history of cystocele, HLD, mitral valve prolapse, osteoporosis, bilateral posterior vitreous detachment, bilateral retinal drusen, Sjogren's syndrome, ORIF R finger thumb (for fx, pin removed later), dyspnea on exertion, chest pain on exertion, see chart for more details.  Patient denies hx of cancer, stroke, seizures, lung problems, diabetes, unexplained weight loss, unexplained changes in bowel or bladder problems, unexplained stumbling or dropping things, and spinal surgery   PRECAUTIONS: osteoporosis  SUBJECTIVE:                                                                                                                                                                                      SUBJECTIVE STATEMENT:  Patient states she is feeling well today. She has a little neck pain upon arrival.  She states she felt better and loosened up after last PT session.   Long term exercise for fitness/health plan/ideas (that patient can see herself doing or expressed she wants to do): walking to the mailbox regularly, walking or using the machines at her senior living community's fitness area regularly. She is not a morning person and likes to be active in the afternoon or evening.     PAIN:  Are you having pain? NPRS 2-3/10 in base of neck  OBJECTIVE  TODAY'S TREATMENT:    Therapeutic exercise: to centralize symptoms and improve ROM, strength, muscular endurance, and activity tolerance required for successful completion of functional activities.  - Upper body ergometer level 7 to encourage joint nutrition, warm tissue, induce analgesic effect of aerobic exercise, improve muscular strength and endurance,  and prepare for remainder of session. 5 min total, changing directions every minute. Attempting to keep RPM 50 or higher.  (Manual therapy - see below)  Superset: - seated thoracic extension over back  of chair with hands clasped behind neck, 2x10 with 5 second hold.  - seated repeated cervical spine rotation with self overpressure, 2x10 each direction.   - seated repeated sidebending with self overpressure, 2x10 each side (more discomfort on right when  bending R or L). - seated repeated cervical spine rotation with self overpressure, 1x10 each direction.  - Education on HEP including handout    Manual therapy: to reduce pain and tissue tension, improve range of motion, neuromodulation, in order to promote improved ability to complete functional activities. IN MASSAGE CHAIR - STM to bilateral UT and posterior cervical spine musculature.   Pt required multimodal cuing for proper technique and to facilitate improved neuromuscular control, strength, range of motion, and functional ability resulting in improved performance and form.   PATIENT EDUCATION:  Education details: Exercise purpose/form. Self management techniques. HEP including handout  Person educated: Patient Education method: Explanation, Demonstration, Tactile cues, and Verbal cues Education comprehension: verbalized understanding, returned demonstration, verbal cues required, tactile cues required, and needs further education   HOME EXERCISE PROGRAM: Access Code: [email or web address] URL: [email or web address] Date: 02/17/2023 Prepared by: Rosita Kea  Exercises - Seated Posture with Lumbar Roll  - Supine Deep Neck Flexor Nods  - 1 x daily - 3 sets - 10 reps - 5 seconds hold - Scaption with Dumbbells  - 3 x weekly - 3 sets - 10 reps - 1 second hold - Row with band/cable  - 1 x daily - 3 sets - 10 reps - 2 seconds hold - Seated Assisted Cervical Rotation with Towel  - 1 x daily - 1-2 sets - 15 reps - 2-5 seconds hold - Standing Cervical Rotation AROM with Overpressure - repeated motions  - 3 x daily - 10-15 reps - 1 second hold - Seated Cervical Sidebend with self Overpressure - Repeated Motions  - 3 x daily - 10-15 reps  - 1 second hold  HOME EXERCISE PROGRAM [[email or web address]] View at "[email or web address]" using code: [email or web address] Suboccipital Stretch -  Repeat 10 Repetitions, Complete 3 Sets, Perform 2 Times a Day  HOME EXERCISE PROGRAM [[id number]] View at "[email or web address]" using code: [email or web address] Modified [place] -  Repeat 8 Repetitions, Complete 3 Sets, Perform 3 Times a Week SQUAT OVERHEAD PRESS - DUMBBELL -  Repeat 10 Repetitions, Complete 3 Sets, Perform 3 Times a Day  HOME EXERCISE PROGRAM [[email or web address]] View at "[email or web address]" using code: [email or web address] Seated thoracic extension vs chair - [place] -  Repeat 10 Repetitions, Perform 3 Times a Day   ASSESSMENT:   CLINICAL IMPRESSION: Patient reported reduction of catching pain and feeling of improved mobility by end of session. She continues to have significant limitations in cervical spine limitations in multiple directions and significant muscle tension in her B UT. Patient's HEP updated to include repeated motions in multiple directions to address multidirectional joint stiffness. Patient would benefit from continued management of limiting condition by skilled physical therapist to address remaining impairments and functional limitations to work towards stated goals and return to PLOF or maximal functional independence.   From Initial PT Evaluation 01/12/2023: Patient is a 82 y.o. female referred to outpatient physical therapy with a medical diagnosis of neck pain who presents with signs and symptoms consistent with subacute chronic neck pain and stiffness. 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 Patient does demonstrate what appears to be multidirectional joint stiffness in the cervical spine and hypertonic B UT muscles that are likely contributing to her pain experience. Would likely benefit from soft tissue mobilization and/or dry needling as well as MDT approach to help  alleviate pain  Patient presents with significant pain, ROM, muscle tension, joint stiffness, motor control, muscle performance (strength/power/endurance) and activity tolerance impairments that are limiting ability to  complete her usual activities including crafts, cleaning, driving, sleeping, turning her head, moving her head, reading without difficulty. Patient will benefit from skilled physical therapy intervention to address current body structure impairments and activity limitations to improve function and work towards goals set in current POC in order to return to prior level of function or maximal functional improvement.      OBJECTIVE IMPAIRMENTS: decreased activity tolerance, decreased coordination, decreased endurance, decreased knowledge of condition, decreased ROM, decreased strength, hypomobility, impaired perceived functional ability, increased muscle spasms, impaired flexibility, and pain.    ACTIVITY LIMITATIONS: bending and sleeping   PARTICIPATION LIMITATIONS: meal prep, cleaning, laundry, driving, community activity, and   difficulty with usual activities including crafts, cleaning, driving, sleeping, turning her head, moving her head, reading   PERSONAL FACTORS: Age, Time since onset of injury/illness/exacerbation, and 3+ comorbidities:   anxiety, arthritis, benign essential tremor, cataracts, GERD, hearing loss, history of cystocele, HLD, mitral valve prolapse, osteoporosis, bilateral posterior vitreous detachment, bilateral retinal drusen, Sjogren's syndrome, ORIF R finger thumb (for fx, pin removed later), dyspnea on exertion, chest pain on exertion, see chart for more details are also affecting patient's functional outcome.    REHAB POTENTIAL: Good   CLINICAL DECISION MAKING: Stable/uncomplicated   EVALUATION COMPLEXITY: Low     GOALS: Goals reviewed with patient? No   SHORT TERM GOALS: Target date: 01/26/2023   Patient will be independent with initial home exercise  program for self-management of symptoms. Baseline: Initial HEP provided at IE (01/12/23); Goal status: MET     LONG TERM GOALS: Target date: 04/06/2023   Patient will be independent with a long-term home exercise program for self-management of symptoms.  Baseline: Initial HEP provided at IE (01/12/23); Goal status: In-progress   2.  Patient will demonstrate improved FOTO to equal or greater than 57 by visit #11 to demonstrate improvement in overall condition and self-reported functional ability.  Baseline: 41 (01/12/23); 43 at visit #5 (01/30/2023);  Goal status: In-progress   3.  Patient will demonstrate improved cervical spine rotation to equal or greater than 60 degrees each direction with no increase in pain to improve her ability to check her blind spot while driving.  Baseline: 29 degrees each direction (01/12/23); Goal status: In-progress   4.  Patient will complete community, work and/or recreational activities with 75% less limitation due to current condition.  Baseline: difficulty with usual activities including crafts, cleaning, driving, sleeping, turning her head, moving her head, reading (01/12/23); Goal status: In-progress   5.  Patient will report pain with functional activity decreased to equal or less than 1/10 to improve her ability to return to making crafts and cleaning with less difficulty.  Baseline: up to 7/10 (01/12/2023);  Goal status: In-progress     PLAN:   PT FREQUENCY: 1-2x/week   PT DURATION: 12 weeks   PLANNED INTERVENTIONS: Therapeutic exercises, Therapeutic activity, Neuromuscular re-education, Patient/Family education, Self Care, Joint mobilization, Dry Needling, Electrical stimulation, Spinal mobilization, Cryotherapy, Moist heat, Manual therapy, and Re-evaluation   PLAN FOR NEXT SESSION: assess response to repeated motions since last PT session, progress accordingly. Manual therapy as appropriate.    Everlean Alstrom. Graylon Good, PT, DPT 02/17/23, 2:04  PM  [facility] [address] [place], [place] [address] P: [phone] I F: [phone]

## 2023-02-23 ENCOUNTER — Ambulatory Visit: Payer: Medicare Other | Attending: Infectious Diseases | Admitting: Physical Therapy

## 2023-02-23 ENCOUNTER — Encounter: Payer: Self-pay | Admitting: Physical Therapy

## 2023-02-23 DIAGNOSIS — M62838 Other muscle spasm: Secondary | ICD-10-CM | POA: Diagnosis present

## 2023-02-23 DIAGNOSIS — M542 Cervicalgia: Secondary | ICD-10-CM | POA: Diagnosis present

## 2023-02-23 NOTE — Therapy (Signed)
OUTPATIENT PHYSICAL THERAPY TREATMENT NOTE / PROGRESS NOTE Dates of reporting from 01/12/2023 to 02/23/2023   Patient Name: Barbara Garcia MRN: [id number] DOB:Apr 05, 1941, 82 y.o., female Today's Date: 02/23/2023  PCP: Leonel Ramsay, MD  REFERRING PROVIDER: Leonel Ramsay, MD   END OF SESSION:   PT End of Session - 02/23/23 1741     Visit Number 10    Number of Visits 24    Date for PT Re-Evaluation 04/06/23    Authorization Type MEDICARE PART B reporting period from 01/12/2023    Progress Note Due on Visit 10    PT Start Time 1350    PT Stop Time 1430    PT Time Calculation (min) 40 min    Activity Tolerance --    Behavior During Therapy WFL for tasks assessed/performed              Past Medical History:  Diagnosis Date   Allergy    Anxiety    Arthritis    Benign essential tremor    Chronic dermatitis    Colon polyps    Combined form of senile cataract    Constipation    GERD (gastroesophageal reflux disease)    Hearing loss    Hemorrhoids    History of cystocele    History of shingles    Hyperglycemia    Hyperlipidemia    Hyperopia with astigmatism and presbyopia    Hypertension    Iris nevus    MVP (mitral valve prolapse)    Osteopenia    Osteoporosis    Posterior vitreous detachment, bilateral    Retinal drusen, bilateral    Sjogrens syndrome    Vitamin D deficiency    Past Surgical History:  Procedure Laterality Date   ABDOMINAL HYSTERECTOMY     BOTOX INJECTION N/A 10/11/2018   Procedure: BOTOX INJECTION;  Surgeon: Jules Husbands, MD;  Location: [facility];  Service: General;  Laterality: N/A;   COLON SURGERY     hemmhoirdectomy with abcess fistula   COLONOSCOPY W/ BIOPSIES  06/18/2005   COLONOSCOPY WITH PROPOFOL N/A 08/03/2019   Procedure: COLONOSCOPY WITH PROPOFOL;  Surgeon: Lin Landsman, MD;  Location: [facility];  Service: Gastroenterology;  Laterality: N/A;   ESOPHAGOGASTRODUODENOSCOPY (EGD) WITH PROPOFOL N/A  08/03/2019   Procedure: ESOPHAGOGASTRODUODENOSCOPY (EGD) WITH PROPOFOL;  Surgeon: Lin Landsman, MD;  Location: [facility];  Service: Gastroenterology;  Laterality: N/A;   ORIF FINGER / THUMB FRACTURE Right    pin removed later   TONSILLECTOMY     Patient Active Problem List   Diagnosis Date Noted   Neurodermatitis 10/03/2019   Dyspnea on exertion 07/01/2019   Chest pain on exertion 07/01/2019   Iron deficiency anemia 07/01/2019   Bug bite 05/28/2019   Anemia 05/28/2019   Anal fissure    Rectal fissure 08/24/2018   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   Coronary artery calcification seen on CAT scan 07/28/2018   Globus sensation 02/22/2018   Hyperlipidemia 03/17/2017   Restless legs 03/17/2017   Posterior vitreous detachment, bilateral 09/29/2016   Retinal drusen, bilateral 09/29/2016   Chronic constipation 12/07/2013   Chronic dermatitis 12/07/2013   Combined form of senile cataract 07/26/2012   Hyperopia with astigmatism and presbyopia 07/26/2012   Iris nevus 07/26/2012   Vitamin D deficiency 02/19/2011   Benign essential tremor 11/25/2010   Colon polyps 11/25/2010   GERD (gastroesophageal reflux disease) 11/25/2010   MVP (mitral valve prolapse) 11/25/2010   Mild anxiety 11/25/2010   Osteopenia 11/25/2010   Sjogren's  syndrome 11/25/2010   History of shingles 11/25/2010    REFERRING DIAG: neck pain  THERAPY DIAG:  Cervicalgia  Other muscle spasm  Rationale for Evaluation and Treatment: Rehabilitation  PERTINENT HISTORY: Patient is a 82 y.o. female who presents to outpatient physical therapy with a referral for medical diagnosis neck pain and patient preference for MDT certified physical therapist This patient's chief complaints consist of neck pain that radiates to base of scull leading to the following functional deficits: difficulty with usual activities including crafts, cleaning, driving, sleeping, turning her head, moving her head, reading. Relevant past medical  history and comorbidities include anxiety, arthritis, benign essential tremor, cataracts, GERD, hearing loss, history of cystocele, HLD, mitral valve prolapse, osteoporosis, bilateral posterior vitreous detachment, bilateral retinal drusen, Sjogren's syndrome, ORIF R finger thumb (for fx, pin removed later), dyspnea on exertion, chest pain on exertion, see chart for more details.  Patient denies hx of cancer, stroke, seizures, lung problems, diabetes, unexplained weight loss, unexplained changes in bowel or bladder problems, unexplained stumbling or dropping things, and spinal surgery   PRECAUTIONS: osteoporosis  SUBJECTIVE:                                                                                                                                                                                      SUBJECTIVE STATEMENT:  Patient arrives with notes from her daughter Olivia Mackie who she spent several days with over the Easter weekend and who is also a PT. Tracy's notes outlined several interventions she used with patient over the weekend and her thoughts about the outcome. Ultimately, patient did not show any meaningful lasting improvement and Olivia Mackie recommended patient return to the doctor to inquire about more testing if no further improvement in the next 2 weeks. She was also supportive of trying dry needling to see if it could add any benefit. Patient states she had a very painful evening at church on Friday for no apparent reason to the point she was almost in tears. She is concerned she was rating her pain too low in the past on the NPRS scale after Olivia Mackie showed her the [employer]. She states if she was not in PT already she would be seeking care with the doctor if her neck was still hurting as much as it is now. She would also like to try dry needling today.   Long term exercise for fitness/health plan/ideas (that patient can see herself doing or expressed she wants to do): walking to the  mailbox regularly, walking or using the machines at her senior living community's fitness area regularly. She is not a morning person and likes to be active in  the afternoon or evening.     PAIN:  Are you having pain? BAKER WONG Faces scale: 4/10 in suboccipital region and right side  OBJECTIVE  SELF-REPORTED FUNCTION FOTO score: 52/100 (neck questionnaire)  CERVICAL SPINE AROM (measured at start of visit).  *Indicates pain Flexion: 35 Extension: 50, pain when coming back forwards . Side Flexion:       R 25 right sided pain      L 22 right sided pain Rotation:  R 35 (limited by right sided pain) L 35 (limited by right sided pain) Retraction: 0.5 inches (right neck pain)  TODAY'S TREATMENT:    Therapeutic exercise: to centralize symptoms and improve ROM, strength, muscular endurance, and activity tolerance required for successful completion of functional activities.  - Upper body ergometer level 7 to encourage joint nutrition, warm tissue, induce analgesic effect of aerobic exercise, improve muscular strength and endurance,  and prepare for remainder of session. 5 min total, changing directions every minute. Attempting to keep RPM 50 or higher.  - measurements to assess progress (see above)  Manual therapy: to reduce pain and tissue tension, improve range of motion, neuromodulation, in order to promote improved ability to complete functional activities. PRONE with face in table cradle - STM to bilateral UT and posterior cervical spine musculature fucosing on B suboccipital region and B UT.   Modality: (unbilled) Dry needling performed to posterior neck to decrease pain and spasms along patient's neck region with patient in prone utilizing 1 dry needle(s) .79mm x 67mm with 1 stick at each side in the sub-occipital muscles and 2 dry needle(s) .49mm x 67mm with 2 sticks along right UT and 1 stick at left UT.  Patient educated about the risks and benefits from therapy and verbally  consents to treatment. Patient with strong muscle twitch response to first dry needle in R UT and some bruising noted at the anterior UT opposite the second needle site (more distal) where needle apparently punctured a small vessel/capillary as is a risk of the technique. Pressure applied to this spot and patient informed about liklihood of bruising there.  Dry needling performed by Everlean Alstrom. Graylon Good PT, DPT who is certified in this technique.   Pt required multimodal cuing for proper technique and to facilitate improved neuromuscular control, strength, range of motion, and functional ability resulting in improved performance and form.   PATIENT EDUCATION:  Education details: Exercise purpose/form. Self management techniques. HEP including handout  Person educated: Patient Education method: Explanation, Demonstration, Tactile cues, and Verbal cues Education comprehension: verbalized understanding, returned demonstration, verbal cues required, tactile cues required, and needs further education   HOME EXERCISE PROGRAM: Access Code: [email or web address] URL: [email or web address] Date: 02/17/2023 Prepared by: Rosita Kea  Exercises - Seated Posture with Lumbar Roll  - Supine Deep Neck Flexor Nods  - 1 x daily - 3 sets - 10 reps - 5 seconds hold - Scaption with Dumbbells  - 3 x weekly - 3 sets - 10 reps - 1 second hold - Row with band/cable  - 1 x daily - 3 sets - 10 reps - 2 seconds hold - Seated Assisted Cervical Rotation with Towel  - 1 x daily - 1-2 sets - 15 reps - 2-5 seconds hold - Standing Cervical Rotation AROM with Overpressure - repeated motions  - 3 x daily - 10-15 reps - 1 second hold - Seated Cervical Sidebend with self Overpressure - Repeated Motions  - 3 x daily - 10-15 reps - 1 second hold  HOME  EXERCISE PROGRAM [[id number]] View at "[email or web address]" using code: [email or web address] Suboccipital Stretch -  Repeat 10 Repetitions, Complete 3 Sets, Perform 2 Times a Day  HOME EXERCISE  PROGRAM [[id number]] View at "[email or web address]" using code: [email or web address] Modified [place] -  Repeat 8 Repetitions, Complete 3 Sets, Perform 3 Times a Week SQUAT OVERHEAD PRESS - DUMBBELL -  Repeat 10 Repetitions, Complete 3 Sets, Perform 3 Times a Day  HOME EXERCISE PROGRAM [[email or web address]] View at "[email or web address]" using code: [email or web address] Seated thoracic extension vs chair - Dustin -  Repeat 10 Repetitions, Perform 3 Times a Day   ASSESSMENT:   CLINICAL IMPRESSION: Patient has attended 10 physical therapy treatment sessions since starting current episode of care on 01/12/2023. She demonstrates good participation in HEP, and significant progress towards cervical spine AROM, and FOTO score. However, she does not feel she is any better than when she started PT and and her highest pain level with functional activities has actually increased by one point since initial evaluation. Patient continues to have significant joint and muscle stiffness and pain, especially at upper cervical spine and over B UT. Utilized dry needling today with patient's permission to see if this can add any additional benefit. Patient will likely require continued repeated ROM exercises performed over long term to improve motion and decrease painful activities as nothing has helped quickly so far. PT agrees with patient's daughter's recommendation to continue with PT for 2 more weeks and return to MD with discharge to long term HEP if no further improvement is made. Patient would benefit from continued management of limiting condition by skilled physical therapist to address remaining impairments and functional limitations to work towards stated goals and return to PLOF or maximal functional independence.    From Initial PT Evaluation 01/12/2023: Patient is a 82 y.o. female referred to outpatient physical therapy with a medical diagnosis of neck pain who presents with signs and symptoms consistent with subacute chronic neck pain and  stiffness. 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 Patient does demonstrate what appears to be multidirectional joint stiffness in the cervical spine and hypertonic B UT muscles that are likely contributing to her pain experience. Would likely benefit from soft tissue mobilization and/or dry needling as well as MDT approach to help alleviate pain  Patient presents with significant pain, ROM, muscle tension, joint stiffness, motor control, muscle performance (strength/power/endurance) and activity tolerance impairments that are limiting ability to complete her usual activities including crafts, cleaning, driving, sleeping, turning her head, moving her head, reading without difficulty. Patient will benefit from skilled physical therapy intervention to address current body structure impairments and activity limitations to improve function and work towards goals set in current POC in order to return to prior level of function or maximal functional improvement.      OBJECTIVE IMPAIRMENTS: decreased activity tolerance, decreased coordination, decreased endurance, decreased knowledge of condition, decreased ROM, decreased strength, hypomobility, impaired perceived functional ability, increased muscle spasms, impaired flexibility, and pain.    ACTIVITY LIMITATIONS: bending and sleeping   PARTICIPATION LIMITATIONS: meal prep, cleaning, laundry, driving, community activity, and   difficulty with usual activities including crafts, cleaning, driving, sleeping, turning her head, moving her head, reading   PERSONAL FACTORS: Age, Time since onset of injury/illness/exacerbation, and 3+ comorbidities:   anxiety, arthritis, benign essential tremor, cataracts, GERD, hearing loss, history of cystocele, HLD, mitral valve prolapse, osteoporosis, bilateral posterior vitreous detachment, bilateral retinal drusen, Sjogren's syndrome, ORIF  R finger thumb (for fx, pin  removed later), dyspnea on exertion, chest pain on exertion, see chart for more details are also affecting patient's functional outcome.    REHAB POTENTIAL: Good   CLINICAL DECISION MAKING: Stable/uncomplicated   EVALUATION COMPLEXITY: Low     GOALS: Goals reviewed with patient? No   SHORT TERM GOALS: Target date: 01/26/2023   Patient will be independent with initial home exercise program for self-management of symptoms. Baseline: Initial HEP provided at [facility] (01/12/23); Goal status: MET     LONG TERM GOALS: Target date: 04/06/2023   Patient will be independent with a long-term home exercise program for self-management of symptoms.  Baseline: Initial HEP provided at IE (01/12/23); participating regularly (02/23/2023);  Goal status: In-progress   2.  Patient will demonstrate improved FOTO to equal or greater than 57 by visit #11 to demonstrate improvement in overall condition and self-reported functional ability.  Baseline: 41 (01/12/23); 43 at visit #5 (01/30/2023); 52 at visit #10 (02/23/2023);  Goal status: In-progress   3.  Patient will demonstrate improved cervical spine rotation to equal or greater than 60 degrees each direction with no increase in pain to improve her ability to check her blind spot while driving.  Baseline: 29 degrees each direction (01/12/23); 35 degrees each direction limited by pain (02/23/2023);  Goal status: In-progress   4.  Patient will complete community, work and/or recreational activities with 75% less limitation due to current condition.  Baseline: difficulty with usual activities including crafts, cleaning, driving, sleeping, turning her head, moving her head, reading (01/12/23); she feels like she has improved a little but she attributes it to changing where she is placing her book and she cannot really tell a whole lot of difference since starting PT (02/23/2023);  Goal status: on-going   5.  Patient will report pain with  functional activity decreased to equal or less than 1/10 to improve her ability to return to making crafts and cleaning with less difficulty.  Baseline: up to 7/10 (01/12/2023); up to 8/10 Friday night (02/23/2023);  Goal status: on-going     PLAN:   PT FREQUENCY: 1-2x/week   PT DURATION: 12 weeks   PLANNED INTERVENTIONS: Therapeutic exercises, Therapeutic activity, Neuromuscular re-education, Patient/Family education, Self Care, Joint mobilization, Dry Needling, Electrical stimulation, Spinal mobilization, Cryotherapy, Moist heat, Manual therapy, and Re-evaluation   PLAN FOR NEXT SESSION: assess response to repeated motions since last PT session, progress accordingly. Manual therapy as appropriate.    Everlean Alstrom. Graylon Good, PT, DPT 02/23/23, 5:54 PM  [facility] [address] [place], [place] [address] P: [phone] I F: [phone]

## 2023-02-25 ENCOUNTER — Ambulatory Visit: Payer: Medicare Other | Admitting: Physical Therapy

## 2023-02-25 ENCOUNTER — Encounter: Payer: Self-pay | Admitting: Physical Therapy

## 2023-02-25 DIAGNOSIS — M542 Cervicalgia: Secondary | ICD-10-CM | POA: Diagnosis not present

## 2023-02-25 DIAGNOSIS — M62838 Other muscle spasm: Secondary | ICD-10-CM

## 2023-02-25 NOTE — Therapy (Signed)
OUTPATIENT PHYSICAL THERAPY TREATMENT NOTE    Patient Name: Barbara Garcia MRN: [id number] DOB:1941-01-03, 82 y.o., female Today's Date: 02/25/2023  PCP: Leonel Ramsay, MD  REFERRING PROVIDER: Leonel Ramsay, MD   END OF SESSION:   PT End of Session - 02/25/23 1347     Visit Number 11    Number of Visits 24    Date for PT Re-Evaluation 04/06/23    Authorization Type MEDICARE PART B reporting period from 02/23/2020    Progress Note Due on Visit 71    PT Start Time 1348    PT Stop Time 1426    PT Time Calculation (min) 38 min    Activity Tolerance Patient tolerated treatment well    Behavior During Therapy WFL for tasks assessed/performed               Past Medical History:  Diagnosis Date   Allergy    Anxiety    Arthritis    Benign essential tremor    Chronic dermatitis    Colon polyps    Combined form of senile cataract    Constipation    GERD (gastroesophageal reflux disease)    Hearing loss    Hemorrhoids    History of cystocele    History of shingles    Hyperglycemia    Hyperlipidemia    Hyperopia with astigmatism and presbyopia    Hypertension    Iris nevus    MVP (mitral valve prolapse)    Osteopenia    Osteoporosis    Posterior vitreous detachment, bilateral    Retinal drusen, bilateral    Sjogrens syndrome    Vitamin D deficiency    Past Surgical History:  Procedure Laterality Date   ABDOMINAL HYSTERECTOMY     BOTOX INJECTION N/A 10/11/2018   Procedure: BOTOX INJECTION;  Surgeon: Jules Husbands, MD;  Location: [facility];  Service: General;  Laterality: N/A;   COLON SURGERY     hemmhoirdectomy with abcess fistula   COLONOSCOPY W/ BIOPSIES  06/18/2005   COLONOSCOPY WITH PROPOFOL N/A 08/03/2019   Procedure: COLONOSCOPY WITH PROPOFOL;  Surgeon: Lin Landsman, MD;  Location: [facility];  Service: Gastroenterology;  Laterality: N/A;   ESOPHAGOGASTRODUODENOSCOPY (EGD) WITH PROPOFOL N/A 08/03/2019   Procedure:  ESOPHAGOGASTRODUODENOSCOPY (EGD) WITH PROPOFOL;  Surgeon: Lin Landsman, MD;  Location: [place];  Service: Gastroenterology;  Laterality: N/A;   ORIF FINGER / THUMB FRACTURE Right    pin removed later   TONSILLECTOMY     Patient Active Problem List   Diagnosis Date Noted   Neurodermatitis 10/03/2019   Dyspnea on exertion 07/01/2019   Chest pain on exertion 07/01/2019   Iron deficiency anemia 07/01/2019   Bug bite 05/28/2019   Anemia 05/28/2019   Anal fissure    Rectal fissure 08/24/2018   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   Coronary artery calcification seen on CAT scan 07/28/2018   Globus sensation 02/22/2018   Hyperlipidemia 03/17/2017   Restless legs 03/17/2017   Posterior vitreous detachment, bilateral 09/29/2016   Retinal drusen, bilateral 09/29/2016   Chronic constipation 12/07/2013   Chronic dermatitis 12/07/2013   Combined form of senile cataract 07/26/2012   Hyperopia with astigmatism and presbyopia 07/26/2012   Iris nevus 07/26/2012   Vitamin D deficiency 02/19/2011   Benign essential tremor 11/25/2010   Colon polyps 11/25/2010   GERD (gastroesophageal reflux disease) 11/25/2010   MVP (mitral valve prolapse) 11/25/2010   Mild anxiety 11/25/2010   Osteopenia 11/25/2010   Sjogren's syndrome 11/25/2010   History  of shingles 11/25/2010    REFERRING DIAG: neck pain  THERAPY DIAG:  Cervicalgia  Other muscle spasm  Rationale for Evaluation and Treatment: Rehabilitation  PERTINENT HISTORY: Patient is a 82 y.o. female who presents to outpatient physical therapy with a referral for medical diagnosis neck pain and patient preference for MDT certified physical therapist This patient's chief complaints consist of neck pain that radiates to base of scull leading to the following functional deficits: difficulty with usual activities including crafts, cleaning, driving, sleeping, turning her head, moving her head, reading. Relevant past medical history and comorbidities  include anxiety, arthritis, benign essential tremor, cataracts, GERD, hearing loss, history of cystocele, HLD, mitral valve prolapse, osteoporosis, bilateral posterior vitreous detachment, bilateral retinal drusen, Sjogren's syndrome, ORIF R finger thumb (for fx, pin removed later), dyspnea on exertion, chest pain on exertion, see chart for more details.  Patient denies hx of cancer, stroke, seizures, lung problems, diabetes, unexplained weight loss, unexplained changes in bowel or bladder problems, unexplained stumbling or dropping things, and spinal surgery   PRECAUTIONS: osteoporosis  SUBJECTIVE:                                                                                                                                                                                      SUBJECTIVE STATEMENT:  Patient arrives reporting she felt the dry needling helped in the upper trap region, but did not seem to do much in her suboccipital region.   Long term exercise for fitness/health plan/ideas (that patient can see herself doing or expressed she wants to do): walking to the mailbox regularly, walking or using the machines at her senior living community's fitness area regularly. She is not a morning person and likes to be active in the afternoon or evening.     PAIN:  Are you having pain? BAKER WONG Faces scale: 3/10 in suboccipital region and right side  OBJECTIVE  OBSERVATION -bruising at right distal upper trap consistent with needle site from last visit. - lack of left cervical paraspinal activation with prone cervical retraction or cervical extension compared to left.   TODAY'S TREATMENT:    Therapeutic exercise: to centralize symptoms and improve ROM, strength, muscular endurance, and activity tolerance required for successful completion of functional activities.  - Upper body ergometer level 5 to encourage joint nutrition, warm tissue, induce analgesic effect of aerobic exercise, improve  muscular strength and endurance,  and prepare for remainder of session. 5 min total, changing directions every minute. Attempting to keep RPM 50 or higher.  - prone cervical spine retraction, 2x10 with 5 second hold. - seated flexion rotation AA self mobilization, 2x10 each side. Cuing for improved flexion  and rotation and less trunk rotation.  - seated cervical retraction with upper cervical spine flexion with self overpressure, 1x10 (more comfortable than AA self mob).   Manual therapy: to reduce pain and tissue tension, improve range of motion, neuromodulation, in order to promote improved ability to complete functional activities. PRONE with face in table cradle - STM to bilateral UT and posterior cervical spine musculature fucosing on B suboccipital region.  SUPINE - flexion rotation stretch for AA joint, 1x1-5 each direction (very painful and guarded with minimal movement, empty end feel).  - AA joint mobilization with slight contralateral sidebend and ipsilateral rotation, grade II-III, discontinued due to lack of tolerance on either side (most painful at right upper cervical spine).   Modality: (unbilled) Dry needling performed to posterior neck to decrease pain and spasms along patient's neck region with patient in prone utilizing 1 dry needle(s) .41mm x 30mm with 1 stick at each side in the sub-occipital oblique capitus inferior muscles.  Patient educated about the risks and benefits from therapy and verbally consents to treatment.  Dry needling performed by Everlean Alstrom. Graylon Good PT, DPT who is certified in this technique.   Pt required multimodal cuing for proper technique and to facilitate improved neuromuscular control, strength, range of motion, and functional ability resulting in improved performance and form.   PATIENT EDUCATION:  Education details: Exercise purpose/form. Self management techniques. HEP including handout  Person educated: Patient Education method: Explanation,  Demonstration, Tactile cues, and Verbal cues Education comprehension: verbalized understanding, returned demonstration, verbal cues required, tactile cues required, and needs further education   HOME EXERCISE PROGRAM: Access Code: [email or web address] URL: [email or web address] Date: 02/17/2023 Prepared by: Rosita Kea  Exercises - Seated Posture with Lumbar Roll  - Supine Deep Neck Flexor Nods  - 1 x daily - 3 sets - 10 reps - 5 seconds hold - Scaption with Dumbbells  - 3 x weekly - 3 sets - 10 reps - 1 second hold - Row with band/cable  - 1 x daily - 3 sets - 10 reps - 2 seconds hold - Seated Assisted Cervical Rotation with Towel  - 1 x daily - 1-2 sets - 15 reps - 2-5 seconds hold - Standing Cervical Rotation AROM with Overpressure - repeated motions  - 3 x daily - 10-15 reps - 1 second hold - Seated Cervical Sidebend with self Overpressure - Repeated Motions  - 3 x daily - 10-15 reps - 1 second hold  HOME EXERCISE PROGRAM [[email or web address]] View at "[email or web address]" using code: [email or web address] Suboccipital Stretch -  Repeat 10 Repetitions, Complete 3 Sets, Perform 2 Times a Day  HOME EXERCISE PROGRAM [[id number]] View at "[email or web address]" using code: [email or web address] Modified [place] -  Repeat 8 Repetitions, Complete 3 Sets, Perform 3 Times a Week SQUAT OVERHEAD PRESS - DUMBBELL -  Repeat 10 Repetitions, Complete 3 Sets, Perform 3 Times a Day  HOME EXERCISE PROGRAM [[email or web address]] View at "[email or web address]" using code: [email or web address] Seated thoracic extension vs chair - [place] -  Repeat 10 Repetitions, Perform 3 Times a [place] [id number] View at "[email or web address]" using code: [id number] Sustained cervical flexion/retraction in sitting  -  Repeat 10 Repetitions, Hold 5 Seconds, Complete 2 Sets, Perform 2 Times a Day  Suboccipital and Statistician  -  Repeat 10 Repetitions, Hold 5 Seconds, Complete 2 Sets, Perform 2 Times a Day   ASSESSMENT:   CLINICAL  IMPRESSION: Patient arrives reporting she felt dry needling helped a little at upper traps but did not improve  her most concordant pain at upper cervical spine. Today's session focused on dry needling at oblique capitus inferior muscles as an alternate suboccipital location. She did seem slightly less tender in muscles needled at suboccipital region last session. She did have bruising at the distal right upper trap as expected after last dry needling session. Also noted to have lack of significant muscle activation in left cervical paraspinal muscles compared to right with prone neck extension or retraction. This was also present but not recorded last PT session as well. Unclear why this asymmetry is presenting. Patient was exceptionally sensitive to joint mobilization techniques to improve motion at AA joints. She seemed to tolerate self mobilization with overpressure better than clinician directed joint mobilizations. However, she did struggle with moving her neck into correct positions instead of compensating with trunk rotation or limited neck flexion. She tolerated repeated upper cervical spine flexion better than AA self mobilization but it is unclear if that was helpful to her ROM or not. Updated HEP to include AA self mobilization and upper cervical spine flexion stretch with self overpressure and plan to assess response at next session. Patient would benefit from continued management of limiting condition by skilled physical therapist to address remaining impairments and functional limitations to work towards stated goals and return to PLOF or maximal functional independence.   From Initial PT Evaluation 01/12/2023: Patient is a 82 y.o. female referred to outpatient physical therapy with a medical diagnosis of neck pain who presents with signs and symptoms consistent with subacute chronic neck pain and stiffness. 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 Patient does demonstrate what appears to be multidirectional joint stiffness in the cervical spine and hypertonic B UT muscles that are likely contributing to her pain experience. Would likely benefit from soft tissue mobilization and/or dry needling as well as MDT approach to help alleviate pain  Patient presents with significant pain, ROM, muscle tension, joint stiffness, motor control, muscle performance (strength/power/endurance) and activity tolerance impairments that are limiting ability to complete her usual activities including crafts, cleaning, driving, sleeping, turning her head, moving her head, reading without difficulty. Patient will benefit from skilled physical therapy intervention to address current body structure impairments and activity limitations to improve function and work towards goals set in current POC in order to return to prior level of function or maximal functional improvement.      OBJECTIVE IMPAIRMENTS: decreased activity tolerance, decreased coordination, decreased endurance, decreased knowledge of condition, decreased ROM, decreased strength, hypomobility, impaired perceived functional ability, increased muscle spasms, impaired flexibility, and pain.    ACTIVITY LIMITATIONS: bending and sleeping   PARTICIPATION LIMITATIONS: meal prep, cleaning, laundry, driving, community activity, and   difficulty with usual activities including crafts, cleaning, driving, sleeping, turning her head, moving her head, reading   PERSONAL FACTORS: Age, Time since onset of injury/illness/exacerbation, and 3+ comorbidities:   anxiety, arthritis, benign essential tremor, cataracts, GERD, hearing loss, history of cystocele, HLD, mitral valve prolapse, osteoporosis, bilateral posterior vitreous detachment, bilateral retinal drusen, Sjogren's syndrome, ORIF R finger thumb (for fx, pin removed later), dyspnea on exertion, chest pain on exertion, see  chart for more details are also affecting patient's functional outcome.    REHAB POTENTIAL: Good   CLINICAL DECISION MAKING: Stable/uncomplicated   EVALUATION COMPLEXITY: Low     GOALS: Goals reviewed with patient? No   SHORT TERM GOALS: Target date: 01/26/2023   Patient will be independent with initial home exercise program for  self-management of symptoms. Baseline: Initial HEP provided at IE (01/12/23); Goal status: MET     LONG TERM GOALS: Target date: 04/06/2023   Patient will be independent with a long-term home exercise program for self-management of symptoms.  Baseline: Initial HEP provided at IE (01/12/23); participating regularly (02/23/2023);  Goal status: In-progress   2.  Patient will demonstrate improved FOTO to equal or greater than 57 by visit #11 to demonstrate improvement in overall condition and self-reported functional ability.  Baseline: 41 (01/12/23); 43 at visit #5 (01/30/2023); 52 at visit #10 (02/23/2023);  Goal status: In-progress   3.  Patient will demonstrate improved cervical spine rotation to equal or greater than 60 degrees each direction with no increase in pain to improve her ability to check her blind spot while driving.  Baseline: 29 degrees each direction (01/12/23); 35 degrees each direction limited by pain (02/23/2023);  Goal status: In-progress   4.  Patient will complete community, work and/or recreational activities with 75% less limitation due to current condition.  Baseline: difficulty with usual activities including crafts, cleaning, driving, sleeping, turning her head, moving her head, reading (01/12/23); she feels like she has improved a little but she attributes it to changing where she is placing her book and she cannot really tell a whole lot of difference since starting PT (02/23/2023);  Goal status: on-going   5.  Patient will report pain with functional activity decreased to equal or less than 1/10 to improve her ability to return to making  crafts and cleaning with less difficulty.  Baseline: up to 7/10 (01/12/2023); up to 8/10 Friday night (02/23/2023);  Goal status: on-going     PLAN:   PT FREQUENCY: 1-2x/week   PT DURATION: 12 weeks   PLANNED INTERVENTIONS: Therapeutic exercises, Therapeutic activity, Neuromuscular re-education, Patient/Family education, Self Care, Joint mobilization, Dry Needling, Electrical stimulation, Spinal mobilization, Cryotherapy, Moist heat, Manual therapy, and Re-evaluation   PLAN FOR NEXT SESSION: assess response to repeated motions since last PT session, progress accordingly. Manual therapy as appropriate.    Everlean Alstrom. Graylon Good, PT, DPT 02/25/23, 8:33 PM  [facility] [address] [place], [place] [address] P: [phone] I F: [phone]

## 2023-03-02 ENCOUNTER — Encounter: Payer: Self-pay | Admitting: Physical Therapy

## 2023-03-02 ENCOUNTER — Ambulatory Visit: Payer: Medicare Other | Admitting: Physical Therapy

## 2023-03-02 DIAGNOSIS — M62838 Other muscle spasm: Secondary | ICD-10-CM

## 2023-03-02 DIAGNOSIS — M542 Cervicalgia: Secondary | ICD-10-CM

## 2023-03-02 NOTE — Therapy (Signed)
OUTPATIENT PHYSICAL THERAPY TREATMENT NOTE / DISCHARGE SUMMARY Dates of reporting from 01/12/2023 to 03/02/2023   Patient Name: Barbara Garcia MRN: [id number] DOB:1941/04/10, 82 y.o., female Today's Date: 03/02/2023  PCP: Mick Sell, MD  REFERRING PROVIDER: Mick Sell, MD   END OF SESSION:   PT End of Session - 03/02/23 1346     Visit Number 12    Number of Visits 24    Date for PT Re-Evaluation 04/06/23    Authorization Type MEDICARE PART B reporting period from 02/23/2020    Progress Note Due on Visit 20    PT Start Time 1345    PT Stop Time 1425    PT Time Calculation (min) 40 min    Activity Tolerance Patient tolerated treatment well    Behavior During Therapy WFL for tasks assessed/performed               Past Medical History:  Diagnosis Date   Allergy    Anxiety    Arthritis    Benign essential tremor    Chronic dermatitis    Colon polyps    Combined form of senile cataract    Constipation    GERD (gastroesophageal reflux disease)    Hearing loss    Hemorrhoids    History of cystocele    History of shingles    Hyperglycemia    Hyperlipidemia    Hyperopia with astigmatism and presbyopia    Hypertension    Iris nevus    MVP (mitral valve prolapse)    Osteopenia    Osteoporosis    Posterior vitreous detachment, bilateral    Retinal drusen, bilateral    Sjogrens syndrome    Vitamin D deficiency    Past Surgical History:  Procedure Laterality Date   ABDOMINAL HYSTERECTOMY     BOTOX INJECTION N/A 10/11/2018   Procedure: BOTOX INJECTION;  Surgeon: Leafy Ro, MD;  Location: [facility] ORS;  Service: General;  Laterality: N/A;   COLON SURGERY     hemmhoirdectomy with abcess fistula   COLONOSCOPY W/ BIOPSIES  06/18/2005   COLONOSCOPY WITH PROPOFOL N/A 08/03/2019   Procedure: COLONOSCOPY WITH PROPOFOL;  Surgeon: Toney Reil, MD;  Location: [facility] ENDOSCOPY;  Service: Gastroenterology;  Laterality: N/A;    ESOPHAGOGASTRODUODENOSCOPY (EGD) WITH PROPOFOL N/A 08/03/2019   Procedure: ESOPHAGOGASTRODUODENOSCOPY (EGD) WITH PROPOFOL;  Surgeon: Toney Reil, MD;  Location: [facility] END[facility];  Service: Gastroenterology;  Laterality: N/A;   ORIF FINGER / THUMB FRACTURE Right    pin removed later   TONSILLECTOMY     Patient Active Problem List   Diagnosis Date Noted   Neurodermatitis 10/03/2019   Dyspnea on exertion 07/01/2019   Chest pain on exertion 07/01/2019   Iron deficiency anemia 07/01/2019   Bug bite 05/28/2019   Anemia 05/28/2019   Anal fissure    Rectal fissure 08/24/2018   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   Coronary artery calcification seen on CAT scan 07/28/2018   Globus sensation 02/22/2018   Hyperlipidemia 03/17/2017   Restless legs 03/17/2017   Posterior vitreous detachment, bilateral 09/29/2016   Retinal drusen, bilateral 09/29/2016   Chronic constipation 12/07/2013   Chronic dermatitis 12/07/2013   Combined form of senile cataract 07/26/2012   Hyperopia with astigmatism and presbyopia 07/26/2012   Iris nevus 07/26/2012   Vitamin D deficiency 02/19/2011   Benign essential tremor 11/25/2010   Colon polyps 11/25/2010   GERD (gastroesophageal reflux disease) 11/25/2010   MVP (mitral valve prolapse) 11/25/2010   Mild anxiety 11/25/2010   Osteopenia  11/25/2010   Sjogren's syndrome 11/25/2010   History of shingles 11/25/2010    REFERRING DIAG: neck pain  THERAPY DIAG:  Cervicalgia  Other muscle spasm  Rationale for Evaluation and Treatment: Rehabilitation  PERTINENT HISTORY: Patient is a 82 y.o. female who presents to outpatient physical therapy with a referral for medical diagnosis neck pain and patient preference for MDT certified physical therapist This patient's chief complaints consist of neck pain that radiates to base of scull leading to the following functional deficits: difficulty with usual activities including crafts, cleaning, driving, sleeping, turning her head,  moving her head, reading. Relevant past medical history and comorbidities include anxiety, arthritis, benign essential tremor, cataracts, GERD, hearing loss, history of cystocele, HLD, mitral valve prolapse, osteoporosis, bilateral posterior vitreous detachment, bilateral retinal drusen, Sjogren's syndrome, ORIF R finger thumb (for fx, pin removed later), dyspnea on exertion, chest pain on exertion, see chart for more details.  Patient denies hx of cancer, stroke, seizures, lung problems, diabetes, unexplained weight loss, unexplained changes in bowel or bladder problems, unexplained stumbling or dropping things, and spinal surgery   PRECAUTIONS: osteoporosis  SUBJECTIVE:                                                                                                                                                                                      SUBJECTIVE STATEMENT:  Patient arrives reporting she is feeling great because she started a steroid pack on Friday after she got really really tired of hurting and her daughter French Anaracy helped her write to her doctor who prescribed her a steroid pack. She states he recommended she go to physiatry if the improvement with steroids do not last. She states she still feels a little stiff but feels a lot better. She states she would like her neck to get a little better than this but she could live with it if her neck stayed this way. She feels some gentle exercises would help the stiffness. Since she started the steroid, she backed off on her HEP, not doing the new exercises but doing the lighter ones. She has been doing her HEP that includes rows with band, AROM cervical spine rotation and flexion/extension. She has also done some SNAG rotations. Patient states she cannot tell that dry needling helped her at all and agrees with PT to stop using it at this time. She feels comfortable with discharging today and doing exercises she has learned in PT or that her daughter French Anaracy  who is a PT has offered to teach her.   Long term exercise for fitness/health plan/ideas (that patient can see herself doing or expressed she wants to do): walking to the mailbox  regularly, walking or using the machines at her senior living community's fitness area regularly. She is not a morning person and likes to be active in the afternoon or evening.     PAIN:  Are you having pain? BAKER WONG Faces scale: 2/10 at bilateral mid to superior cervical spine.   OBJECTIVE  SELF-REPORTED FUNCTION FOTO score: 63/100 (neck questionnaire)  CERVICAL SPINE AROM (measured at start of visit).  *Indicates pain Flexion: 50 Extension: 60, pain when coming back forwards (catch in back).  Side Flexion:       R 30       L 25 tension on the right Rotation:  R 60 tension at right of neck L 46 tension at right of neck (up to 54 degrees after repeated upper cervical spine left rotation).  Retraction: 0.5 inches    TODAY'S TREATMENT:    Therapeutic exercise: to centralize symptoms and improve ROM, strength, muscular endurance, and activity tolerance required for successful completion of functional activities.  - Upper body ergometer level 5 to encourage joint nutrition, warm tissue, induce analgesic effect of aerobic exercise, improve muscular strength and endurance,  and prepare for remainder of session. 5 min total, changing directions every minute. Attempting to keep RPM 50 or higher.  - cervical spine AROM measurements to assess progress (see above).  - seated flexion rotation AA self mobilization, 1x10 right rotation, 2x10 left rotation. Cuing for improved flexion and rotation and tactile cuing to prevent trunk rotation (improved left rotation after) - seated cervical retraction with upper cervical spine flexion with self overpressure, 2x10, 5 second holds.   Manual therapy: to reduce pain and tissue tension, improve range of motion, neuromodulation, in order to promote improved ability to complete  functional activities. PRONE with face in table cradle - STM to bilateral UT and posterior cervical spine musculature fucosing on B suboccipital region (R > L).    Pt required multimodal cuing for proper technique and to facilitate improved neuromuscular control, strength, range of motion, and functional ability resulting in improved performance and form.   PATIENT EDUCATION:  Education details: Exercise purpose/form. Self management techniques. HEP including handout  Person educated: Patient Education method: Explanation, Demonstration, Tactile cues, and Verbal cues Education comprehension: verbalized understanding, returned demonstration, verbal cues required, tactile cues required, and needs further education   HOME EXERCISE PROGRAM: Access Code: [email or web address] URL: [email or web address] Date: 02/17/2023 Prepared by: Norton Blizzard  Exercises - Seated Posture with Lumbar Roll  - Supine Deep Neck Flexor Nods  - 1 x daily - 3 sets - 10 reps - 5 seconds hold - Scaption with Dumbbells  - 3 x weekly - 3 sets - 10 reps - 1 second hold - Row with band/cable  - 1 x daily - 3 sets - 10 reps - 2 seconds hold - Seated Assisted Cervical Rotation with Towel  - 1 x daily - 1-2 sets - 15 reps - 2-5 seconds hold - Standing Cervical Rotation AROM with Overpressure - repeated motions  - 3 x daily - 10-15 reps - 1 second hold - Seated Cervical Sidebend with self Overpressure - Repeated Motions  - 3 x daily - 10-15 reps - 1 second hold  HOME EXERCISE PROGRAM [[id number]] View at "[email or web address]" using code: [email or web address] Suboccipital Stretch -  Repeat 10 Repetitions, Complete 3 Sets, Perform 2 Times a Day  HOME EXERCISE PROGRAM [[id number]] View at "[email or web address]" using code: [email or web address] Modified [place]pee -  Repeat 8 Repetitions, Complete 3 Sets, Perform 3 Times a Week SQUAT OVERHEAD  PRESS - DUMBBELL -  Repeat 10 Repetitions, Complete 3 Sets, Perform 3 Times a Day  HOME EXERCISE PROGRAM  [[email or web address]] View at "[email or web address]" using code: [email or web address] Seated thoracic extension vs chair - Dustin -  Repeat 10 Repetitions, Perform 3 Times a Day  HOME EXERCISE PROGRAM [[id number]] View at "[email or web address]" using code: [id number] Sustained cervical flexion/retraction in sitting  -  Repeat 10 Repetitions, Hold 5 Seconds, Complete 2 Sets, Perform 2 Times a Day  Suboccipital and Capital Rotation Stretch  -  Repeat 10 Repetitions, Hold 5 Seconds, Complete 2 Sets, Perform 2 Times a Day   ASSESSMENT:   CLINICAL IMPRESSION: Patient has attended 12 physical therapy sessions since starting current episode of care on 01/12/2023. [place]out she showed some improvement on the FOTO questionnaire for her neck at the 10th visit progress note, she did not feel she had improved at that point. The plan was to do 2 more weeks of PT at that time including a trial of dry needling, then refer her back to her spine doctor if no significant improvement had occurred. Dry needling was provided last week with no obvious improvement and patient was having so much pain she contacted her doctor with her daughter's help. Since then, she has been feeling much better after the doctor prescribed a prednisone taper. Patient's measurements in PT were much better today and she demonstrated significant progress towards goals, but it seemed much more related to the prednisone than any recent improvement from PT or updated HEP (patient did not do new exercises). Patient continues to demonstrate pattern of pain and stiffness (less severe while on prednisone but still present) implicating the upper cervical spine, especially the right side and the AA joint as significantly involved in her pain. Exercises targeting this region from HEP were reviewed and patient educated on the significance of using the window of opportunity provided by less pain during the prednisone taper to utilize these exercises for increased chance of long term  improvement. Manual therapy was also provided today (without neck twisting at pt request) to try to improve likelihood that pain will remain controlled after taper has abated. Patient's doctor has recommended referral to physiatry for pain management if her pain returns following completion of the prednisone taper. PT agrees that she has not had the success hoped for with PT intervention and agree with doctor's suggestion. Patient may also require prolonged period of consistent stretching to the upper cervical spine as in the case of an MDT dysfunction classification to improve her ROM and pain, but this can be achieved through consistent application of long term HEP. Patient is now discharged from PT due to lack of sufficient response over 12 visits.    OBJECTIVE IMPAIRMENTS: decreased activity tolerance, decreased coordination, decreased endurance, decreased knowledge of condition, decreased ROM, decreased strength, hypomobility, impaired perceived functional ability, increased muscle spasms, impaired flexibility, and pain.    ACTIVITY LIMITATIONS: bending and sleeping   PARTICIPATION LIMITATIONS: meal prep, cleaning, laundry, driving, community activity, and   difficulty with usual activities including crafts, cleaning, driving, sleeping, turning her head, moving her head, reading   PERSONAL FACTORS: Age, Time since onset of injury/illness/exacerbation, and 3+ comorbidities:   anxiety, arthritis, benign essential tremor, cataracts, GERD, hearing loss, history of cystocele, HLD, mitral valve prolapse, osteoporosis, bilateral posterior vitreous detachment, bilateral retinal drusen, Sjogren's syndrome, ORIF R finger thumb (for fx, pin removed later), dyspnea on exertion, chest pain on exertion, see chart for more details are also affecting patient's functional outcome.  REHAB POTENTIAL: Good   CLINICAL DECISION MAKING: Stable/uncomplicated   EVALUATION COMPLEXITY: Low     GOALS: Goals reviewed  with patient? No   SHORT TERM GOALS: Target date: 01/26/2023   Patient will be independent with initial home exercise program for self-management of symptoms. Baseline: Initial HEP provided at IE (01/12/23); Goal status: MET     LONG TERM GOALS: Target date: 04/06/2023   Patient will be independent with a long-term home exercise program for self-management of symptoms.  Baseline: Initial HEP provided at IE (01/12/23); participating regularly (02/23/2023; 4/9/204);  Goal status: MET   2.  Patient will demonstrate improved FOTO to equal or greater than 57 by visit #11 to demonstrate improvement in overall condition and self-reported functional ability.  Baseline: 41 (01/12/23); 43 at visit #5 (01/30/2023); 52 at visit #10 (02/23/2023); 63 at visit #12 (03/02/2023);  Goal status: Met by visit #12   3.  Patient will demonstrate improved cervical spine rotation to equal or greater than 60 degrees each direction with no increase in pain to improve her ability to check her blind spot while driving.  Baseline: 29 degrees each direction (01/12/23); 35 degrees each direction limited by pain (02/23/2023); R/L 60/47 (03/02/2023);  Goal status: Partially met   4.  Patient will complete community, work and/or recreational activities with 75% less limitation due to current condition.  Baseline: difficulty with usual activities including crafts, cleaning, driving, sleeping, turning her head, moving her head, reading (01/12/23); she feels like she has improved a little but she attributes it to changing where she is placing her book and she cannot really tell a whole lot of difference since starting PT (02/23/2023); rates improvement as 75% since starting PT (03/02/2023);  Goal status: MET   5.  Patient will report pain with functional activity decreased to equal or less than 1/10 to improve her ability to return to making crafts and cleaning with less difficulty.  Baseline: up to 7/10 (01/12/2023); up to 8/10 Friday night  (02/23/2023); up to 4/10 since starting prednisone (03/02/2023);  Goal status: Partially met     PLAN:   PT FREQUENCY: 1-2x/week   PT DURATION: 12 weeks   PLANNED INTERVENTIONS: Therapeutic exercises, Therapeutic activity, Neuromuscular re-education, Patient/Family education, Self Care, Joint mobilization, Dry Needling, Electrical stimulation, Spinal mobilization, Cryotherapy, Moist heat, Manual therapy, and Re-evaluation   PLAN FOR NEXT SESSION: patient is now discharged from PT to independent management with long term HEP or follow up with doctor if pain returns/persists.    Luretha Murphy. Ilsa Iha, PT, DPT 03/02/23, 5:45 PM  [facility] [address] [place], [place] [address] P: [phone] I F: [phone]

## 2023-03-04 ENCOUNTER — Ambulatory Visit: Payer: Medicare Other | Admitting: Physical Therapy

## 2023-03-24 ENCOUNTER — Other Ambulatory Visit: Payer: Self-pay | Admitting: Infectious Diseases

## 2023-03-24 DIAGNOSIS — M542 Cervicalgia: Secondary | ICD-10-CM

## 2023-03-25 ENCOUNTER — Ambulatory Visit
Admission: RE | Admit: 2023-03-25 | Discharge: 2023-03-25 | Disposition: A | Discharge status: Home / Self Care | Payer: Medicare Other | Source: Ambulatory Visit | Attending: Infectious Diseases | Admitting: Infectious Diseases

## 2023-03-25 DIAGNOSIS — M542 Cervicalgia: Secondary | ICD-10-CM | POA: Diagnosis present

## 2023-06-09 ENCOUNTER — Other Ambulatory Visit: Payer: Self-pay | Admitting: Cardiovascular Disease

## 2023-06-09 NOTE — Telephone Encounter (Signed)
Pt scheduled on 9/10

## 2023-06-09 NOTE — Telephone Encounter (Signed)
 Please contact pt for future appointment. Pt due for 12 month f/u. 

## 2023-06-26 ENCOUNTER — Emergency Department (HOSPITAL_BASED_OUTPATIENT_CLINIC_OR_DEPARTMENT_OTHER)
Admission: EM | Admit: 2023-06-26 | Discharge: 2023-06-26 | Disposition: A | Discharge status: Home / Self Care | Payer: Medicare Other | Attending: Emergency Medicine | Admitting: Emergency Medicine

## 2023-06-26 ENCOUNTER — Other Ambulatory Visit: Payer: Self-pay

## 2023-06-26 ENCOUNTER — Encounter (HOSPITAL_BASED_OUTPATIENT_CLINIC_OR_DEPARTMENT_OTHER): Payer: Self-pay

## 2023-06-26 ENCOUNTER — Emergency Department (HOSPITAL_BASED_OUTPATIENT_CLINIC_OR_DEPARTMENT_OTHER): Payer: Medicare Other | Admitting: Radiology

## 2023-06-26 DIAGNOSIS — J040 Acute laryngitis: Secondary | ICD-10-CM | POA: Insufficient documentation

## 2023-06-26 DIAGNOSIS — R911 Solitary pulmonary nodule: Secondary | ICD-10-CM | POA: Insufficient documentation

## 2023-06-26 DIAGNOSIS — Z20822 Contact with and (suspected) exposure to covid-19: Secondary | ICD-10-CM | POA: Diagnosis not present

## 2023-06-26 DIAGNOSIS — J069 Acute upper respiratory infection, unspecified: Secondary | ICD-10-CM | POA: Diagnosis not present

## 2023-06-26 DIAGNOSIS — R059 Cough, unspecified: Secondary | ICD-10-CM | POA: Diagnosis present

## 2023-06-26 LAB — CBC WITH DIFFERENTIAL/PLATELET
Abs Immature Granulocytes: 0.02 10*3/uL (ref 0.00–0.07)
Basophils Absolute: 0 10*3/uL (ref 0.0–0.1)
Basophils Relative: 0 %
Eosinophils Absolute: 0 10*3/uL (ref 0.0–0.5)
Eosinophils Relative: 0 %
HCT: 33.1 % — ABNORMAL LOW (ref 36.0–46.0)
Hemoglobin: 11 g/dL — ABNORMAL LOW (ref 12.0–15.0)
Immature Granulocytes: 0 %
Lymphocytes Relative: 14 %
Lymphs Abs: 1.1 10*3/uL (ref 0.7–4.0)
MCH: 29.8 pg (ref 26.0–34.0)
MCHC: 33.2 g/dL (ref 30.0–36.0)
MCV: 89.7 fL (ref 80.0–100.0)
Monocytes Absolute: 0.8 10*3/uL (ref 0.1–1.0)
Monocytes Relative: 11 %
Neutro Abs: 5.6 10*3/uL (ref 1.7–7.7)
Neutrophils Relative %: 75 %
Platelets: 154 10*3/uL (ref 150–400)
RBC: 3.69 MIL/uL — ABNORMAL LOW (ref 3.87–5.11)
RDW: 12.7 % (ref 11.5–15.5)
WBC: 7.5 10*3/uL (ref 4.0–10.5)
nRBC: 0 % (ref 0.0–0.2)

## 2023-06-26 LAB — BASIC METABOLIC PANEL
Anion gap: 8 (ref 5–15)
BUN: 17 mg/dL (ref 8–23)
CO2: 25 mmol/L (ref 22–32)
Calcium: 8.8 mg/dL — ABNORMAL LOW (ref 8.9–10.3)
Chloride: 102 mmol/L (ref 98–111)
Creatinine, Ser: 0.87 mg/dL (ref 0.44–1.00)
GFR, Estimated: >60 mL/min (ref >60)
Glucose, Bld: 113 mg/dL — ABNORMAL HIGH (ref 70–99)
Potassium: 4.2 mmol/L (ref 3.5–5.1)
Sodium: 135 mmol/L (ref 135–145)

## 2023-06-26 LAB — RESP PANEL BY RT-PCR (RSV, FLU A&B, COVID)  RVPGX2
Influenza A by PCR: NEGATIVE
Influenza B by PCR: NEGATIVE
Resp Syncytial Virus by PCR: NEGATIVE
SARS Coronavirus 2 by RT PCR: NEGATIVE

## 2023-06-26 LAB — GROUP A STREP BY PCR: Group A Strep by PCR: NOT DETECTED

## 2023-06-26 LAB — TROPONIN I (HIGH SENSITIVITY): Troponin I (High Sensitivity): 2 ng/L (ref <18)

## 2023-06-26 MED ORDER — DEXAMETHASONE SODIUM PHOSPHATE 10 MG/ML IJ SOLN
6.0000 mg | Freq: Once | INTRAMUSCULAR | Status: AC
  Administered 2023-06-26: 6 mg via INTRAVENOUS
  Filled 2023-06-26: qty 1

## 2023-06-26 NOTE — ED Provider Notes (Signed)
[place] EMERGENCY DEPARTMENT AT [facility] Provider Note   CSN: [id number] Arrival date & time: 06/26/23  1256     History  Chief Complaint  Patient presents with   URI    Barbara Garcia is a 82 y.o. female presenting to ED with cough, congestion, sore throat, chest pain.  The patient has 2 daughters, one of whom is at the bedside, both of whom were sick last week with similar symptoms of viral URI infection.  The patient reports for the past 5 days she has had a sore throat, now improved, a lot of congestion, coughing especially at night, and feeling weak.  She said that she "lost her voice" about 2 days ago  Separately she reports she has had a right-sided chest pain that she has noted particularly with walking and exertion for several months.  She says she has been seen by her cardiologist and told that it is likely not her heart.  HPI     Home Medications Prior to Admission medications   Medication Sig Start Date End Date Taking? Authorizing Provider  albuterol (VENTOLIN HFA) 108 (90 Base) MCG/ACT inhaler Inhale 2 puffs into the lungs every 6 (six) hours as needed for wheezing or shortness of breath. 07/01/19   Glori Luis, MD  ALPRAZolam Prudy Feeler) 0.5 MG tablet Take 0.5 tablets (0.25 mg total) by mouth at bedtime as needed for anxiety. 06/29/20   Emi Belfast, FNP  atorvastatin (LIPITOR) 10 MG tablet Take 1 tablet (10 mg total) by mouth daily. CALL AND SCHEDULE OVERDUE OFFICE VISIT TO RECEIVE FURTHER REFILLS. 2ND ATTEMPT. 06/11/22   Antonieta Iba, MD  augmented betamethasone dipropionate (DIPROLENE-AF) 0.05 % ointment Apply topically 2 (two) times daily as needed. 04/05/20   [provider]  Bioflavonoid Products (SUPER-C 1000 PO) Take 1 tablet by mouth daily.    [provider]  carboxymethylcellulose (REFRESH PLUS) 0.5 % SOLN Apply to eye daily as needed.    [provider]  escitalopram (LEXAPRO) 5 MG tablet Take 1  tablet (5 mg total) by mouth daily. 06/16/21   Eulis Foster, FNP  fexofenadine-pseudoephedrine (ALLEGRA-D) 60-120 MG 12 hr tablet Take 1 tablet by mouth 2 (two) times daily as needed.    [provider]  magnesium oxide (MAG-OX) 400 MG tablet Take 400 mg by mouth daily.    [provider]  metoprolol succinate (TOPROL-XL) 25 MG 24 hr tablet TAKE 1 TABLET BY MOUTH ONCE  DAILY 06/09/23   Antonieta Iba, MD  nitroGLYCERIN (NITROSTAT) 0.4 MG SL tablet Place 1 tablet (0.4 mg total) under the tongue every 5 (five) minutes as needed for chest pain. 03/11/21   Alver Sorrow, NP  pantoprazole (PROTONIX) 20 MG tablet Take 1 tablet (20 mg total) by mouth daily. 06/28/20   Emi Belfast, FNP  propranolol (INDERAL) 10 MG tablet Take 1 tablet (10 mg total) by mouth 3 (three) times daily as needed. For fast heart rate or palpitations 11/04/21 10/06/22  Antonieta Iba, MD      Allergies    Lisinopril    Review of Systems   Review of Systems  Physical Exam Updated Vital Signs BP (!) 155/72 (BP Location: Right Arm)   Pulse 91   Temp 98.4 F (36.9 C)   Resp 17   SpO2 96%  Physical Exam Constitutional:      General: She is not in acute distress.    Comments: Hoarse voice  HENT:  Head: Normocephalic and atraumatic.  Eyes:     Conjunctiva/sclera: Conjunctivae normal.     Pupils: Pupils are equal, round, and reactive to light.  Cardiovascular:     Rate and Rhythm: Normal rate and regular rhythm.  Pulmonary:     Effort: Pulmonary effort is normal. No respiratory distress.  Abdominal:     General: There is no distension.     Tenderness: There is no abdominal tenderness.  Skin:    General: Skin is warm and dry.  Neurological:     General: No focal deficit present.     Mental Status: She is alert and oriented to person, place, and time. Mental status is at baseline.  Psychiatric:        Mood and Affect: Mood normal.        Behavior: Behavior normal.     ED  Results / Procedures / Treatments   Labs (all labs ordered are listed, but only abnormal results are displayed) Labs Reviewed  CBC WITH DIFFERENTIAL/PLATELET - Abnormal; Notable for the following components:      Result Value   RBC 3.69 (*)    Hemoglobin 11.0 (*)    HCT 33.1 (*)    All other components within normal limits  BASIC METABOLIC PANEL - Abnormal; Notable for the following components:   Glucose, Bld 113 (*)    Calcium 8.8 (*)    All other components within normal limits  RESP PANEL BY RT-PCR (RSV, FLU A&B, COVID)  [id number]  GROUP A STREP BY PCR  TROPONIN I (HIGH SENSITIVITY)    EKG EKG Interpretation Date/Time:  Saturday June 26 2023 15:05:26 EDT Ventricular Rate:  78 PR Interval:  156 QRS Duration:  78 QT Interval:  380 QTC Calculation: 433 R Axis:   54  Text Interpretation: Sinus rhythm Atrial premature complex Confirmed by Alvester Chou [phone]) on 06/26/2023 3:06:29 PM  Radiology No results found.  Procedures Procedures    Medications Ordered in ED Medications  dexamethasone (DECADRON) injection 6 mg (6 mg Intravenous Given 06/26/23 1457)    ED Course/ Medical Decision Making/ A&P                                 Medical Decision Making Amount and/or Complexity of Data Reviewed Labs: ordered. Radiology: ordered. ECG/medicine tests: ordered.  Risk Prescription drug management.   Patient is presenting to ED with constellation of symptom strongly suggestive of viral upper respiratory infection.  This includes laryngitis, cough, congestion, multiple sick contacts with the same symptoms a week beforehand.  I have a lower suspicion for bacterial infection, strep pharyngitis, bacterial pneumonia, and there is no indication for antibiotics at this time.  Separately the patient reported she has been having exertional chest pain for many months.  She felt that it worsened this month.  She is currently asymptomatic, but we discussed checking a troponin, EKG,  chest x-ray.  I have a low suspicion for acute PE given that this has been intermittent for several months.  Her daughter was present at the bedside to provide supplemental history.  I personally viewed and interpreted patient's labs and imaging, notable for no emergent findings.  X-ray was concerning for right-sided pulmonary nodule.  This was discussed with the patient and her daughter who understand that she should follow-up with her PCP for an outpatient CT scan.  IV Decadron was given for the patient's sore throat, mild headaches, suspected viral syndrome, after  discussion of the risks and benefits of steroids with the patient and her daughter.        Final Clinical Impression(s) / ED Diagnoses Final diagnoses:  Viral URI with cough  Laryngitis  Lung nodule    Rx / DC Orders ED Discharge Orders     None         Terald Sleeper, MD 06/26/23 1553

## 2023-06-26 NOTE — ED Triage Notes (Signed)
She c/o congestion, cough and sore throat x 2-3 days. She is in no distress. She is a bit hoarse.

## 2023-06-26 NOTE — ED Notes (Signed)
 RN reviewed discharge instructions with pt. Pt verbalized understanding and had no further questions. VSS upon discharge.  

## 2023-06-26 NOTE — Discharge Instructions (Addendum)
You were diagnosed with a likely upper respiratory infection.  Your symptoms will improve over the next week.  Continue using over-the-counter medications as needed for cold and flu, schedule follow-up appointment with your doctors office.  We talked about the fact that you have an incidental finding on your x-ray.  This may be a nodular spot on your right lung.  We would recommend that your doctor arrange for a CT scan of the lung without contrast to better visualize the spot.  You can talk to your doctor about this

## 2023-07-01 ENCOUNTER — Other Ambulatory Visit: Payer: Self-pay | Admitting: Infectious Diseases

## 2023-07-01 DIAGNOSIS — R9389 Abnormal findings on diagnostic imaging of other specified body structures: Secondary | ICD-10-CM

## 2023-07-02 ENCOUNTER — Ambulatory Visit
Admission: RE | Admit: 2023-07-02 | Discharge: 2023-07-02 | Disposition: A | Discharge status: Home / Self Care | Payer: Medicare Other | Source: Ambulatory Visit | Attending: Infectious Diseases | Admitting: Infectious Diseases

## 2023-07-02 DIAGNOSIS — R9389 Abnormal findings on diagnostic imaging of other specified body structures: Secondary | ICD-10-CM

## 2023-07-05 ENCOUNTER — Ambulatory Visit: Admission: RE | Admit: 2023-07-05 | Payer: Medicare Other | Source: Ambulatory Visit

## 2023-07-16 ENCOUNTER — Other Ambulatory Visit: Payer: Self-pay | Admitting: Infectious Diseases

## 2023-07-16 DIAGNOSIS — J189 Pneumonia, unspecified organism: Secondary | ICD-10-CM

## 2023-07-16 DIAGNOSIS — R0681 Apnea, not elsewhere classified: Secondary | ICD-10-CM

## 2023-07-16 DIAGNOSIS — R0683 Snoring: Secondary | ICD-10-CM

## 2023-07-16 DIAGNOSIS — R9389 Abnormal findings on diagnostic imaging of other specified body structures: Secondary | ICD-10-CM

## 2023-07-16 DIAGNOSIS — I25118 Atherosclerotic heart disease of native coronary artery with other forms of angina pectoris: Secondary | ICD-10-CM

## 2023-08-02 NOTE — Progress Notes (Unsigned)
Cardiology Office Note  Date:  08/02/2023   ID:  Hollister, Lotto 11-Jun-1941, MRN [id number]  PCP:  Mick Sell, MD   No chief complaint on file.   HPI:  [name] is a 82 year old woman with past medical history of GERD anxiety Mitral valve prolapse Previous chest pain Coronary calcification on CT scan Who presents for f/u of her coronary artery disease  Last seen by myself in clinic June 2022 Seen by one of our providers August 2023   Palpitations better on metoprolol Sleeping better , not needing trazodone Active Not need prorpanolol Not using NTG  Stress with husband at home Place him at home, he is an active with health issues  echocardiogram, results reviewed  1. Left ventricular ejection fraction, by estimation, is 65 to 70%. The  left ventricle has normal function. The left ventricle has no regional  wall motion abnormalities. Left ventricular diastolic parameters are  consistent with Grade II diastolic dysfunction (pseudonormalization). The average left ventricular global longitudinal strain is -15.2 %.   2. Right ventricular systolic function is normal. The right ventricular  size is normal. There is normal pulmonary artery systolic pressure. The  estimated right ventricular systolic pressure is 34.4 mmHg.   3. The mitral valve is normal in structure. Unable to exclude mild  prolapse, challenging images. Mild mitral valve regurgitation.   Event monitor, reviewed Patient had a min HR of 54 bpm, max HR of 200 bpm, and avg HR of 81 bpm.  Predominant underlying rhythm was Sinus Rhythm.    27 Supraventricular Tachycardia/atrial tachycardia  runs occurred, the run with the fastest interval lasting 4 beats with a max rate of 200 bpm, the longest lasting 14 beats with an avg rate of 121 bpm.   Isolated SVEs were rare (<1.0%), SVE Couplets were rare (<1.0%), and SVE Triplets were rare (<1.0%). Isolated VEs were rare (<1.0%, 47), VE Couplets  were rare (<1.0%, 7), and VE Triplets were rare (<1.0%, 1).    Patient triggered events not associated with significant arrhythmia  No diabetes No smoking  Other past medical hx In 07/2015 Prior stress test, carotid ultrasound, abdominal aortic ultrasound , CT coronary calcium score No significant carotid disease as detailed below, normal stress test, aorta was normal Very low calcium score 39  In 2015 She has been maintained on low-dose Lipitor When she takes the medication on a regular basis total cholesterol usually relatively well controlled Most recently total cholesterol 160 It has been as low as 140 Baseline cholesterol without any medication is typically 230s  Testing detailed below Prior stress test September 2016 NSR at rest.   7:35 Bruce; 117% MPHR.   No exertional chest pain. Normal BP response.   1-1.72mm upsloping ST depression laterally at peak, transient/brief; normal in early recovery.   Symptomatically negative, electrically equivocal ECG treadmill test.  Carotid ultrasound Normal July 2015  CT coronary calcium score 39 in the LAD February 2015  ultrasound aorta normal study abdominal 1/ 2015  Stress test 2011 Carotid ultrasound 2010, no stenosis   PMH:   has a past medical history of Allergy, Anxiety, Arthritis, Benign essential tremor, Chronic dermatitis, Colon polyps, Combined form of senile cataract, Constipation, GERD (gastroesophageal reflux disease), Hearing loss, Hemorrhoids, History of cystocele, History of shingles, Hyperglycemia, Hyperlipidemia, Hyperopia with astigmatism and presbyopia, Hypertension, Iris nevus, MVP (mitral valve prolapse), Osteopenia, Osteoporosis, Posterior vitreous detachment, bilateral, Retinal drusen, bilateral, Sjogrens syndrome (HCC), and Vitamin D deficiency.  PSH:    Past  Surgical History:  Procedure Laterality Date   ABDOMINAL HYSTERECTOMY     BOTOX INJECTION N/A 10/11/2018   Procedure: BOTOX INJECTION;  Surgeon:  Leafy Ro, MD;  Location: [facility] ORS;  Service: General;  Laterality: N/A;   COLON SURGERY     hemmhoirdectomy with abcess fistula   COLONOSCOPY W/ BIOPSIES  06/18/2005   COLONOSCOPY WITH PROPOFOL N/A 08/03/2019   Procedure: COLONOSCOPY WITH PROPOFOL;  Surgeon: Toney Reil, MD;  Location: [facility] ENDOSCOPY;  Service: Gastroenterology;  Laterality: N/A;   ESOPHAGOGASTRODUODENOSCOPY (EGD) WITH PROPOFOL N/A 08/03/2019   Procedure: ESOPHAGOGASTRODUODENOSCOPY (EGD) WITH PROPOFOL;  Surgeon: Toney Reil, MD;  Location: [facility] ENDOSCOPY;  Service: Gastroenterology;  Laterality: N/A;   ORIF FINGER / THUMB FRACTURE Right    pin removed later   TONSILLECTOMY      Current Outpatient Medications  Medication Sig Dispense Refill   albuterol (VENTOLIN HFA) 108 (90 Base) MCG/ACT inhaler Inhale 2 puffs into the lungs every 6 (six) hours as needed for wheezing or shortness of breath. 18 g 0   ALPRAZolam (XANAX) 0.5 MG tablet Take 0.5 tablets (0.25 mg total) by mouth at bedtime as needed for anxiety. 15 tablet 0   atorvastatin (LIPITOR) 10 MG tablet Take 1 tablet (10 mg total) by mouth daily. CALL AND SCHEDULE OVERDUE OFFICE VISIT TO RECEIVE FURTHER REFILLS. 2ND ATTEMPT. 15 tablet 0   augmented betamethasone dipropionate (DIPROLENE-AF) 0.05 % ointment Apply topically 2 (two) times daily as needed.     Bioflavonoid Products (SUPER-C 1000 PO) Take 1 tablet by mouth daily.     carboxymethylcellulose (REFRESH PLUS) 0.5 % SOLN Apply to eye daily as needed.     escitalopram (LEXAPRO) 5 MG tablet Take 1 tablet (5 mg total) by mouth daily. 90 tablet 0   fexofenadine-pseudoephedrine (ALLEGRA-D) 60-120 MG 12 hr tablet Take 1 tablet by mouth 2 (two) times daily as needed.     magnesium oxide (MAG-OX) 400 MG tablet Take 400 mg by mouth daily.     metoprolol succinate (TOPROL-XL) 25 MG 24 hr tablet TAKE 1 TABLET BY MOUTH ONCE  DAILY 90 tablet 0   nitroGLYCERIN (NITROSTAT) 0.4 MG SL tablet Place 1 tablet (0.4 mg  total) under the tongue every 5 (five) minutes as needed for chest pain. 25 tablet 2   pantoprazole (PROTONIX) 20 MG tablet Take 1 tablet (20 mg total) by mouth daily. 30 tablet 2   propranolol (INDERAL) 10 MG tablet Take 1 tablet (10 mg total) by mouth 3 (three) times daily as needed. For fast heart rate or palpitations 270 tablet 2   No current facility-administered medications for this visit.     Allergies:   Lisinopril   Social History:  The patient  reports that she has never smoked. She has never used smokeless tobacco. She reports that she does not drink alcohol and does not use drugs.   Family History:   family history includes Arthritis in her sister; Cancer in her mother; Heart Problems in her brother, brother, brother, brother, sister, and sister; Heart attack in her father; Heart disease in her father, mother, and sister; Heart failure in her mother; Hyperlipidemia in her father; Hypertension in her father, mother, sister, and sister; Kidney disease in her sister; Stroke in her mother and sister.    Review of Systems: Review of Systems  Constitutional: Negative.   HENT: Negative.    Respiratory: Negative.    Cardiovascular: Negative.   Gastrointestinal: Negative.   Musculoskeletal: Negative.   Neurological: Negative.  Psychiatric/Behavioral: Negative.    All other systems reviewed and are negative.   PHYSICAL EXAM: VS:  There were no vitals taken for this visit. , BMI There is no height or weight on file to calculate BMI. Constitutional:  oriented to person, place, and time. No distress.  HENT:  Head: Grossly normal Eyes:  no discharge. No scleral icterus.  Neck: No JVD, no carotid bruits  Cardiovascular: Regular rate and rhythm, no murmurs appreciated Pulmonary/Chest: Clear to auscultation bilaterally, no wheezes or rails Abdominal: Soft.  no distension.  no tenderness.  Musculoskeletal: Normal range of motion Neurological:  normal muscle tone. Coordination  normal. No atrophy Skin: Skin warm and dry Psychiatric: normal affect, pleasant  Recent Labs: 06/26/2023: BUN 17; Creatinine, Ser 0.87; Hemoglobin 11.0; Platelets 154; Potassium 4.2; Sodium 135    Lipid Panel Lab Results  Component Value Date   CHOL 227 (H) 06/24/2020   HDL 60.30 06/24/2020   LDLCALC 147 (H) 06/24/2020   TRIG 102.0 06/24/2020      Wt Readings from Last 3 Encounters:  07/14/22 132 lb (59.9 kg)  04/24/21 136 lb 4 oz (61.8 kg)  03/11/21 135 lb (61.2 kg)     ASSESSMENT AND PLAN:  Chest pain No sx, no further epsiodes  Shortness of breath Stay active walk  Coronary artery calcification seen on CAT scan - Plan: EKG 12-Lead Minimal coronary calcification noted 5 years ago stable  Mixed hyperlipidemia Continue Lipitor Number stable  Hypertension goal BP (blood pressure) < 130/80 - Plan: EKG 12-Lead Blood pressure stable on metoprolol  MVP (mitral valve prolapse) No significant murmur appreciated on exam  no significant prolapse on exam No further work-up   Total encounter time more than 25 minutes  Greater than 50% was spent in counseling and coordination of care with the patient   No orders of the defined types were placed in this encounter.    Signed, Dossie Arbour, M.D., Ph.D. 08/02/2023  [facility] [place], [place] [phone]

## 2023-08-03 ENCOUNTER — Encounter: Payer: Self-pay | Admitting: Cardiovascular Disease

## 2023-08-03 ENCOUNTER — Ambulatory Visit: Payer: Medicare Other | Attending: Cardiovascular Disease | Admitting: Cardiovascular Disease

## 2023-08-03 VITALS — BP 160/80 | HR 65 | Ht 65.0 in | Wt 134.0 lb

## 2023-08-03 DIAGNOSIS — Z79899 Other long term (current) drug therapy: Secondary | ICD-10-CM | POA: Insufficient documentation

## 2023-08-03 DIAGNOSIS — I341 Nonrheumatic mitral (valve) prolapse: Secondary | ICD-10-CM | POA: Diagnosis not present

## 2023-08-03 DIAGNOSIS — E785 Hyperlipidemia, unspecified: Secondary | ICD-10-CM | POA: Diagnosis present

## 2023-08-03 DIAGNOSIS — R0609 Other forms of dyspnea: Secondary | ICD-10-CM | POA: Insufficient documentation

## 2023-08-03 DIAGNOSIS — I10 Essential (primary) hypertension: Secondary | ICD-10-CM | POA: Diagnosis not present

## 2023-08-03 DIAGNOSIS — I2510 Atherosclerotic heart disease of native coronary artery without angina pectoris: Secondary | ICD-10-CM | POA: Insufficient documentation

## 2023-08-03 MED ORDER — METOPROLOL SUCCINATE ER 25 MG PO TB24: 25.0000 mg | ORAL_TABLET | Freq: Every day | ORAL | 3 refills | Status: DC

## 2023-08-03 MED ORDER — ATORVASTATIN CALCIUM 10 MG PO TABS: 10.0000 mg | ORAL_TABLET | Freq: Every day | ORAL | 3 refills | Status: DC

## 2023-08-03 NOTE — Patient Instructions (Addendum)
Please monitor blood pressure at home   Medication Instructions:  No changes  If you need a refill on your cardiac medications before your next appointment, please call your pharmacy.   Lab work: No new labs needed  Testing/Procedures: Your provider would like for you to have following labs drawn today Lipid panel.    Follow-Up: At [facility], you and your health needs are our priority.  As part of our continuing mission to provide you with exceptional heart care, we have created designated Provider Care Teams.  These Care Teams include your primary Cardiologist (physician) and Advanced Practice Providers (APPs -  Physician Assistants and Nurse Practitioners) who all work together to provide you with the care you need, when you need it.  You will need a follow up appointment in 12 months  Providers on your designated Care Team:   Nicolasa Ducking, NP Eula Listen, PA-C Cadence Fransico Michael, [place]  COVID-19 Vaccine Information can be found at: [email or web address] For questions related to vaccine distribution or appointments, please email [email or web address] or call [phone].

## 2023-08-04 LAB — LIPID PANEL
Chol/HDL Ratio: 2.7 ratio (ref 0.0–4.4)
Cholesterol, Total: 179 mg/dL (ref 100–199)
HDL: 66 mg/dL (ref >39)
LDL Chol Calc (NIH): 96 mg/dL (ref 0–99)
Triglycerides: 91 mg/dL (ref 0–149)
VLDL Cholesterol Cal: 17 mg/dL (ref 5–40)

## 2023-08-06 ENCOUNTER — Telehealth: Payer: Self-pay | Admitting: Emergency Medicine

## 2023-08-06 MED ORDER — ATORVASTATIN CALCIUM 20 MG PO TABS: 20.0000 mg | ORAL_TABLET | Freq: Every day | ORAL | 3 refills | Status: DC

## 2023-08-06 MED ORDER — EZETIMIBE 10 MG PO TABS: 10.0000 mg | ORAL_TABLET | Freq: Every day | ORAL | 3 refills | Status: DC

## 2023-08-06 NOTE — Telephone Encounter (Signed)
Called patient and notified her of the following from Dr. Mariah Milling.  Cholesterol numbers reviewed  Mildly elevated above goal  Would recommend increasing atorvastatin up to 20 mg daily  Also add Zetia 10 mg daily   Patient verbalizes understanding. Prescription sent to preferred pharmacy.

## 2023-08-20 ENCOUNTER — Ambulatory Visit
Admission: RE | Admit: 2023-08-20 | Discharge: 2023-08-20 | Disposition: A | Discharge status: Home / Self Care | Payer: Medicare Other | Source: Ambulatory Visit | Attending: Infectious Diseases | Admitting: Infectious Diseases

## 2023-08-20 DIAGNOSIS — J189 Pneumonia, unspecified organism: Secondary | ICD-10-CM

## 2023-08-20 DIAGNOSIS — R0683 Snoring: Secondary | ICD-10-CM

## 2023-08-20 DIAGNOSIS — I25118 Atherosclerotic heart disease of native coronary artery with other forms of angina pectoris: Secondary | ICD-10-CM | POA: Diagnosis present

## 2023-08-20 DIAGNOSIS — R0681 Apnea, not elsewhere classified: Secondary | ICD-10-CM | POA: Diagnosis present

## 2023-08-20 DIAGNOSIS — R9389 Abnormal findings on diagnostic imaging of other specified body structures: Secondary | ICD-10-CM | POA: Diagnosis present

## 2023-08-25 ENCOUNTER — Other Ambulatory Visit: Payer: Self-pay | Admitting: Cardiovascular Disease

## 2023-09-27 ENCOUNTER — Other Ambulatory Visit: Payer: Self-pay | Admitting: Infectious Diseases

## 2023-09-27 DIAGNOSIS — Z1231 Encounter for screening mammogram for malignant neoplasm of breast: Secondary | ICD-10-CM

## 2023-09-29 ENCOUNTER — Other Ambulatory Visit: Payer: Self-pay | Admitting: *Deleted

## 2023-09-29 ENCOUNTER — Inpatient Hospital Stay
Admission: RE | Admit: 2023-09-29 | Discharge: 2023-09-29 | Disposition: A | Discharge status: Home / Self Care | Payer: Self-pay | Source: Ambulatory Visit | Attending: Infectious Diseases | Admitting: Infectious Diseases

## 2023-09-29 DIAGNOSIS — Z1231 Encounter for screening mammogram for malignant neoplasm of breast: Secondary | ICD-10-CM

## 2023-09-30 ENCOUNTER — Ambulatory Visit
Admission: RE | Admit: 2023-09-30 | Discharge: 2023-09-30 | Disposition: A | Discharge status: Home / Self Care | Payer: Medicare Other | Source: Ambulatory Visit | Attending: Infectious Diseases | Admitting: Infectious Diseases

## 2023-09-30 DIAGNOSIS — Z1231 Encounter for screening mammogram for malignant neoplasm of breast: Secondary | ICD-10-CM | POA: Insufficient documentation

## 2024-06-05 ENCOUNTER — Other Ambulatory Visit: Payer: Self-pay | Admitting: Cardiovascular Disease

## 2024-07-10 ENCOUNTER — Encounter: Payer: Self-pay | Admitting: Surgery

## 2024-07-10 ENCOUNTER — Ambulatory Visit (INDEPENDENT_AMBULATORY_CARE_PROVIDER_SITE_OTHER): Admitting: Surgery

## 2024-07-10 VITALS — BP 171/79 | HR 76 | Temp 97.9°F | Ht 65.0 in | Wt 135.2 lb

## 2024-07-10 DIAGNOSIS — K624 Stenosis of anus and rectum: Secondary | ICD-10-CM | POA: Diagnosis not present

## 2024-07-10 DIAGNOSIS — K602 Anal fissure, unspecified: Secondary | ICD-10-CM

## 2024-07-10 NOTE — Progress Notes (Signed)
 Patient ID: Barbara Garcia, female   DOB: 25-Jun-1941, 83 y.o.   MRN: [id number]  HPI Barbara Garcia is a 83 y.o. female seen for recurrent anal fissure.  Did have a history of multiple anorectal procedures including I&D of perirectal abscess as well as hemorrhoidectomy.  Did perform a Botox  injection with a chemical sphincterotomy over 5 years ago.Barbara Garcia  He responded well to the treatment but over the last few months has recurred symptoms and symptoms are worse. She experiences anorectal pain, moderate sharp after bowel movement.  She also has difficulty evacuating at times. She has used some laxatives but develops nausea. Intermittent constipation  HPI  Past Medical History:  Diagnosis Date   Allergy    Anxiety    Arthritis    Benign essential tremor    Chronic dermatitis    Colon polyps    Combined form of senile cataract    Constipation    GERD (gastroesophageal reflux disease)    Hearing loss    Hemorrhoids    History of cystocele    History of shingles    Hyperglycemia    Hyperlipidemia    Hyperopia with astigmatism and presbyopia    Hypertension    Iris nevus    MVP (mitral valve prolapse)    Osteopenia    Osteoporosis    Posterior vitreous detachment, bilateral    Retinal drusen, bilateral    Sjogrens syndrome (HCC)    Vitamin D  deficiency     Past Surgical History:  Procedure Laterality Date   ABDOMINAL HYSTERECTOMY     BOTOX  INJECTION N/A 10/11/2018   Procedure: BOTOX  INJECTION;  Surgeon: Barbara Laneta FALCON, MD;  Location: [facility] ORS;  Service: General;  Laterality: N/A;   COLON SURGERY     hemmhoirdectomy with abcess fistula   COLONOSCOPY W/ BIOPSIES  06/18/2005   COLONOSCOPY WITH PROPOFOL  N/A 08/03/2019   Procedure: COLONOSCOPY WITH PROPOFOL ;  Surgeon: Barbara Corinn Skiff, MD;  Location: [facility] ENDOSCOPY;  Service: Gastroenterology;  Laterality: N/A;   ESOPHAGOGASTRODUODENOSCOPY (EGD) WITH PROPOFOL  N/A 08/03/2019   Procedure: ESOPHAGOGASTRODUODENOSCOPY  (EGD) WITH PROPOFOL ;  Surgeon: Barbara Corinn Skiff, MD;  Location: [facility] ENDOSCOPY;  Service: Gastroenterology;  Laterality: N/A;   ORIF FINGER / THUMB FRACTURE Right    pin removed later   TONSILLECTOMY      Family History  Problem Relation Age of Onset   Hypertension Mother    Cancer Mother    Stroke Mother    Heart disease Mother    Heart failure Mother    Heart disease Father    Hyperlipidemia Father    Hypertension Father    Heart attack Father    Arthritis Sister    Hypertension Sister    Kidney disease Sister    Stroke Sister    Heart Problems Sister    Heart disease Sister    Heart Problems Sister    Hypertension Sister    Heart Problems Brother    Heart Problems Brother    Heart Problems Brother    Heart Problems Brother    Breast cancer Neg Hx     Social History Social History   Tobacco Use   Smoking status: Never   Smokeless tobacco: Never  Vaping Use   Vaping status: Never Used  Substance Use Topics   Alcohol use: No   Drug use: No    Allergies  Allergen Reactions   Lisinopril Cough    Current Outpatient Medications  Medication Sig Dispense Refill   albuterol  (VENTOLIN  HFA)  108 (90 Base) MCG/ACT inhaler Inhale 2 puffs into the lungs every 6 (six) hours as needed for wheezing or shortness of breath. 18 g 0   atorvastatin  (LIPITOR) 20 MG tablet TAKE 1 TABLET BY MOUTH DAILY 90 tablet 3   carboxymethylcellulose (REFRESH PLUS) 0.5 % SOLN Apply to eye daily as needed.     escitalopram  (LEXAPRO ) 5 MG tablet Take 1 tablet (5 mg total) by mouth daily. 90 tablet 0   ezetimibe  (ZETIA ) 10 MG tablet Take 1 tablet (10 mg total) by mouth daily. 90 tablet 3   fexofenadine-pseudoephedrine (ALLEGRA-D) 60-120 MG 12 hr tablet Take 1 tablet by mouth 2 (two) times daily as needed.     magnesium oxide (MAG-OX) 400 MG tablet Take 400 mg by mouth daily.     metoprolol  succinate (TOPROL -XL) 25 MG 24 hr tablet TAKE 1 TABLET BY MOUTH ONCE  DAILY 90 tablet 3   nitroGLYCERIN   (NITROSTAT ) 0.4 MG SL tablet Place 1 tablet (0.4 mg total) under the tongue every 5 (five) minutes as needed for chest pain. 25 tablet 2   pantoprazole  (PROTONIX ) 20 MG tablet Take 1 tablet (20 mg total) by mouth daily. 30 tablet 2   No current facility-administered medications for this visit.     Review of Systems Full ROS  was asked and was negative except for the information on the HPI  Physical Exam Blood pressure (!) 171/79, pulse 76, temperature 97.9 F (36.6 C), temperature source Oral, height 5' 5 (1.651 m), weight 135 lb 3.2 oz (61.3 kg), SpO2 97%. CONSTITUTIONAL: NAD. EYES: Pupils are equal, round, Sclera are non-icteric. EARS, NOSE, MOUTH AND THROAT: The oropharynx is clear. The oral mucosa is pink and moist. Hearing is intact to voice. LYMPH NODES:  Lymph nodes in the neck are normal. RESPIRATORY:  Lungs are clear. There is normal respiratory effort, with equal breath sounds bilaterally, and without pathologic use of accessory muscles. CARDIOVASCULAR: Heart is regular without murmurs, gallops, or rubs. GI: The abdomen is  soft, nontender, and nondistended. There are no palpable masses. There is no hepatosplenomegaly. There are normal bowel sounds in all quadrants. Rectal there is increase in sphincter tone with some difficulty dilating the anal sphincter, there is tenderness on post midline. Anoscopy was performed in the standard fashion and revealed anal fissure w/o rectal masses, no blood GU: Rectal deferred.   MUSCULOSKELETAL: Normal muscle strength and tone. No cyanosis or edema.   SKIN: Turgor is good and there are no pathologic skin lesions or ulcers. NEUROLOGIC: Motor and sensation is grossly normal. Cranial nerves are grossly intact. PSYCH:  Oriented to person, place and time. Affect is normal.  Data Reviewed I have personally reviewed the patient's imaging, laboratory findings and medical records.    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  Discussed with the patient in detail about her disease process.  She responded before to Botox  injection.  Cussed about introducing Benefiber as well as sitz bath's and nifedipine cream.  Do think that given recurrence of symptoms and significant quality-of-life issues we can go ahead andschedule her for chemical sphincterotomy with Botox .  She understands that if this were to fail surgical sphincterotomy will be the next course of action. I personally spent a total of 45 minutes in the care of the patient today including performing a medically appropriate exam/evaluation, counseling and educating, placing orders, referring and communicating with other health care professionals, documenting clinical information in the EHR, independently interpreting and  reviewing images studies and coordinating care.    Laneta Luna, MD FACS General Surgeon 07/10/2024, 10:02 AM

## 2024-07-10 NOTE — Patient Instructions (Addendum)
 Please pick up Nifedipine at [facility] in [place]. They will call you when its ready.    You have requested to have an exam under anesthesia with Botox  injection. This will be done at [facility] with Dr. Jordis .  If you are on any injectable weight loss medication, you will need to stop taking your GLP-1 injectable (weight loss) medications 8 days before your surgery to avoid any complications with anesthesia.   You will most likely be out of work 3-4 days for this surgery.  If you have FMLA or disability paperwork that needs filled out you may drop this off at our office or this can be faxed to [phone].  Please see the (blue)pre-care form that you have been given today. Our surgery scheduler will call you to verify surgery date and to go over information.   If you have any questions, please call our office.   Botox  is injected close to or into the internal sphincter muscle. The injection works by causing the muscle to become paralysed and thereby reducing the amount of spasm or contraction that it undergoes in conjunction with a fissure. This enforces relaxation and allows more blood to the area to heal the fissure. The Botox  lasts in your system for between 8-12 weeks. This will usually allow the fissure enough time to heal. It is usually well tolerated but does require a general anaesthetic to administer. This will also allow the surgeon to examine you in some detail, which may be too uncomfortable in the clinic. As it is well tolerated, people who fail to heal on the first injection may often be offered repeated injections in an attempt to heal the fissure. If the fissure still continues to be a problem despite repeated Botox  injections, then further procedures may be considered depending on your case. Due to the weakening of the muscle, some people do experience a degree of urgency or incontinence after the injection, although this is temporary as the Botox  does  wear off.   Anal Fissure, Adult  An anal fissure is a small tear or crack in the tissue around the opening of the butt (anus). Bleeding from the tear or crack usually stops on its own within a few minutes. The bleeding may happen every time you poop (have a bowel movement) until the tear or crack heals. What are the causes? This condition is usually caused by passing a large or hard poop (stool). Other causes include: Trouble pooping (constipation). Passing watery poop (diarrhea). Inflammatory bowel disease (Crohn's disease or ulcerative colitis). Childbirth. Infections. Anal sex. What are the signs or symptoms? Symptoms of this condition include: Bleeding from the butt. Small amounts of blood on your poop. The blood coats the outside of the poop. It is not mixed with the poop. Small amounts of blood on the toilet paper or in the toilet after you poop. Pain when passing poop. Itching or irritation around the opening of the butt. How is this diagnosed? This condition may be diagnosed based on a physical exam. Your doctor may: Check your butt. A tear can often be seen by checking the area with care. Check your butt using a short tube (anoscope). The light in the tube will show any problems in your butt. How is this treated? Treatment for this condition may include: Treating problems that make it hard for you to pass poop. You may be told to: Eat more fiber. Drink more fluid. Take fiber supplements. Take medicines that make poop  soft. Taking warm water baths (sitz baths). This may help to heal the tear. Using creams and ointments. If your condition gets worse, other treatments may be needed such as: A shot near the tear or crack (botulinum injection). Surgery to repair the tear or crack. Follow these instructions at home: Eating and drinking  Avoid bananas and dairy products. These foods can make it hard to poop. Drink enough fluid to keep your pee (urine) pale yellow. Eat  foods that have a lot of fiber in them, such as: Beans. Whole grains. Fresh fruits. Fresh vegetables. General instructions  Take over-the-counter and prescription medicines only as told by your doctor. Use creams or ointments only as told by your doctor. Keep the butt area as clean and dry as you can. Take a warm water bath as told by your doctor. Do not use soap. Keep all follow-up visits as told by your doctor. This is important. Contact a doctor if: You have more bleeding. You have a fever. You have watery poop that is mixed with blood. You have pain. Your problem gets worse, not better. Summary An anal fissure is a small tear or crack in the skin around the opening of the butt (anus). This condition is usually caused by passing a large or hard poop (stool). Treatment includes treating the problems that make it hard for you to pass poop. Follow your doctor's instructions about caring for your condition at home. Keep all follow-up visits as told by your doctor. This is important. This information is not intended to replace advice given to you by your health care provider. Make sure you discuss any questions you have with your health care provider. Document Revised: 06/05/2021 Document Reviewed: 06/05/2021 Elsevier Patient Education  2023 [employer].

## 2024-07-10 NOTE — Progress Notes (Signed)
 Patient has been advised of Pre-Admission date/time, and Surgery date at [facility].  Surgery Date: 07/14/24 Preadmission Testing Date: 07/12/24 (phone 1P-4P)  Patient has been made aware to call [phone], between 1-3:00pm the day before surgery, to find out what time to arrive for surgery.

## 2024-07-10 NOTE — H&P (View-Only) (Signed)
 Patient ID: Barbara Garcia, female   DOB: 25-Jun-1941, 83 y.o.   MRN: [id number]  HPI Barbara Garcia is a 83 y.o. female seen for recurrent anal fissure.  Did have a history of multiple anorectal procedures including I&D of perirectal abscess as well as hemorrhoidectomy.  Did perform a Botox  injection with a chemical sphincterotomy over 5 years ago.[name]  He responded well to the treatment but over the last few months has recurred symptoms and symptoms are worse. She experiences anorectal pain, moderate sharp after bowel movement.  She also has difficulty evacuating at times. She has used some laxatives but develops nausea. Intermittent constipation  HPI  Past Medical History:  Diagnosis Date   Allergy    Anxiety    Arthritis    Benign essential tremor    Chronic dermatitis    Colon polyps    Combined form of senile cataract    Constipation    GERD (gastroesophageal reflux disease)    Hearing loss    Hemorrhoids    History of cystocele    History of shingles    Hyperglycemia    Hyperlipidemia    Hyperopia with astigmatism and presbyopia    Hypertension    Iris nevus    MVP (mitral valve prolapse)    Osteopenia    Osteoporosis    Posterior vitreous detachment, bilateral    Retinal drusen, bilateral    Sjogrens syndrome (HCC)    Vitamin D  deficiency     Past Surgical History:  Procedure Laterality Date   ABDOMINAL HYSTERECTOMY     BOTOX  INJECTION N/A 10/11/2018   Procedure: BOTOX  INJECTION;  Surgeon: Jordis Laneta FALCON, MD;  Location: [facility];  Service: General;  Laterality: N/A;   COLON SURGERY     hemmhoirdectomy with abcess fistula   COLONOSCOPY W/ BIOPSIES  06/18/2005   COLONOSCOPY WITH PROPOFOL  N/A 08/03/2019   Procedure: COLONOSCOPY WITH PROPOFOL ;  Surgeon: Unk Corinn Skiff, MD;  Location: [facility];  Service: Gastroenterology;  Laterality: N/A;   ESOPHAGOGASTRODUODENOSCOPY (EGD) WITH PROPOFOL  N/A 08/03/2019   Procedure: ESOPHAGOGASTRODUODENOSCOPY  (EGD) WITH PROPOFOL ;  Surgeon: Unk Corinn Skiff, MD;  Location: [facility];  Service: Gastroenterology;  Laterality: N/A;   ORIF FINGER / THUMB FRACTURE Right    pin removed later   TONSILLECTOMY      Family History  Problem Relation Age of Onset   Hypertension Mother    Cancer Mother    Stroke Mother    Heart disease Mother    Heart failure Mother    Heart disease Father    Hyperlipidemia Father    Hypertension Father    Heart attack Father    Arthritis Sister    Hypertension Sister    Kidney disease Sister    Stroke Sister    Heart Problems Sister    Heart disease Sister    Heart Problems Sister    Hypertension Sister    Heart Problems Brother    Heart Problems Brother    Heart Problems Brother    Heart Problems Brother    Breast cancer Neg Hx     Social History Social History   Tobacco Use   Smoking status: Never   Smokeless tobacco: Never  Vaping Use   Vaping status: Never Used  Substance Use Topics   Alcohol use: No   Drug use: No    Allergies  Allergen Reactions   Lisinopril Cough    Current Outpatient Medications  Medication Sig Dispense Refill   albuterol  (VENTOLIN  HFA)  108 (90 Base) MCG/ACT inhaler Inhale 2 puffs into the lungs every 6 (six) hours as needed for wheezing or shortness of breath. 18 g 0   atorvastatin  (LIPITOR) 20 MG tablet TAKE 1 TABLET BY MOUTH DAILY 90 tablet 3   carboxymethylcellulose (REFRESH PLUS) 0.5 % SOLN Apply to eye daily as needed.     escitalopram  (LEXAPRO ) 5 MG tablet Take 1 tablet (5 mg total) by mouth daily. 90 tablet 0   ezetimibe  (ZETIA ) 10 MG tablet Take 1 tablet (10 mg total) by mouth daily. 90 tablet 3   fexofenadine-pseudoephedrine (ALLEGRA-D) 60-120 MG 12 hr tablet Take 1 tablet by mouth 2 (two) times daily as needed.     magnesium oxide (MAG-OX) 400 MG tablet Take 400 mg by mouth daily.     metoprolol  succinate (TOPROL -XL) 25 MG 24 hr tablet TAKE 1 TABLET BY MOUTH ONCE  DAILY 90 tablet 3   nitroGLYCERIN   (NITROSTAT ) 0.4 MG SL tablet Place 1 tablet (0.4 mg total) under the tongue every 5 (five) minutes as needed for chest pain. 25 tablet 2   pantoprazole  (PROTONIX ) 20 MG tablet Take 1 tablet (20 mg total) by mouth daily. 30 tablet 2   No current facility-administered medications for this visit.     Review of Systems Full ROS  was asked and was negative except for the information on the HPI  Physical Exam Blood pressure (!) 171/79, pulse 76, temperature 97.9 F (36.6 C), temperature source Oral, height 5' 5 (1.651 m), weight 135 lb 3.2 oz (61.3 kg), SpO2 97%. CONSTITUTIONAL: NAD. EYES: Pupils are equal, round, Sclera are non-icteric. EARS, NOSE, MOUTH AND THROAT: The oropharynx is clear. The oral mucosa is pink and moist. Hearing is intact to voice. LYMPH NODES:  Lymph nodes in the neck are normal. RESPIRATORY:  Lungs are clear. There is normal respiratory effort, with equal breath sounds bilaterally, and without pathologic use of accessory muscles. CARDIOVASCULAR: Heart is regular without murmurs, gallops, or rubs. GI: The abdomen is  soft, nontender, and nondistended. There are no palpable masses. There is no hepatosplenomegaly. There are normal bowel sounds in all quadrants. Rectal there is increase in sphincter tone with some difficulty dilating the anal sphincter, there is tenderness on post midline. Anoscopy was performed in the standard fashion and revealed anal fissure w/o rectal masses, no blood GU: Rectal deferred.   MUSCULOSKELETAL: Normal muscle strength and tone. No cyanosis or edema.   SKIN: Turgor is good and there are no pathologic skin lesions or ulcers. NEUROLOGIC: Motor and sensation is grossly normal. Cranial nerves are grossly intact. PSYCH:  Oriented to person, place and time. Affect is normal.  Data Reviewed I have personally reviewed the patient's imaging, laboratory findings and medical records.    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  Discussed with the patient in detail about her disease process.  She responded before to Botox  injection.  Cussed about introducing Benefiber as well as sitz bath's and nifedipine cream.  Do think that given recurrence of symptoms and significant quality-of-life issues we can go ahead andschedule her for chemical sphincterotomy with Botox .  She understands that if this were to fail surgical sphincterotomy will be the next course of action. I personally spent a total of 45 minutes in the care of the patient today including performing a medically appropriate exam/evaluation, counseling and educating, placing orders, referring and communicating with other health care professionals, documenting clinical information in the EHR, independently interpreting and  reviewing images studies and coordinating care.    Laneta Luna, MD FACS General Surgeon 07/10/2024, 10:02 AM

## 2024-07-12 ENCOUNTER — Telehealth: Payer: Self-pay

## 2024-07-12 ENCOUNTER — Encounter
Admission: RE | Admit: 2024-07-12 | Discharge: 2024-07-12 | Disposition: A | Discharge status: Home / Self Care | Source: Ambulatory Visit | Attending: Surgery | Admitting: Surgery

## 2024-07-12 ENCOUNTER — Other Ambulatory Visit: Payer: Self-pay

## 2024-07-12 VITALS — Ht 65.0 in | Wt 135.0 lb

## 2024-07-12 DIAGNOSIS — I341 Nonrheumatic mitral (valve) prolapse: Secondary | ICD-10-CM

## 2024-07-12 DIAGNOSIS — Z01812 Encounter for preprocedural laboratory examination: Secondary | ICD-10-CM

## 2024-07-12 DIAGNOSIS — Z01810 Encounter for preprocedural cardiovascular examination: Secondary | ICD-10-CM

## 2024-07-12 DIAGNOSIS — E782 Mixed hyperlipidemia: Secondary | ICD-10-CM

## 2024-07-12 DIAGNOSIS — I2510 Atherosclerotic heart disease of native coronary artery without angina pectoris: Secondary | ICD-10-CM

## 2024-07-12 HISTORY — DX: Anal fissure, unspecified: K60.2

## 2024-07-12 HISTORY — DX: Pneumonia, unspecified organism: J18.9

## 2024-07-12 HISTORY — DX: Shortness of breath: R06.02

## 2024-07-12 HISTORY — DX: Atherosclerotic heart disease of native coronary artery without angina pectoris: I25.10

## 2024-07-12 HISTORY — DX: Iron deficiency anemia, unspecified: D50.9

## 2024-07-12 NOTE — Patient Instructions (Addendum)
 Your procedure is scheduled on: Friday, August 22 Report to the Registration Desk on the 1st floor of the [facility] To find out your arrival time, please call [phone] between 1PM - 3PM on: Thursday, August 21 If your arrival time is 6:00 am, do not arrive before that time as the [facility] entrance doors do not open until 6:00 am.  REMEMBER: Instructions that are not followed completely may result in serious medical risk, up to and including death; or upon the discretion of your surgeon and anesthesiologist your surgery may need to be rescheduled.  Do not eat food after midnight the night before surgery.  No gum chewing or hard candies.  You may however, drink CLEAR liquids up to 2 hours before you are scheduled to arrive for your surgery. Do not drink anything within 2 hours of your scheduled arrival time.  Clear liquids include: - water  - apple juice without pulp - gatorade (not RED colors) - black coffee or tea (Do NOT add milk or creamers to the coffee or tea) Do NOT drink anything that is not on this list.  One week prior to surgery: Stop Anti-inflammatories (NSAIDS) such as Advil, Aleve, Ibuprofen, Motrin, Naproxen, Naprosyn and Aspirin based products such as Excedrin, Goody's Powder, BC Powder. Stop ANY OVER THE COUNTER supplements until after surgery.  You may however, continue to take Tylenol  if needed for pain up until the day of surgery.  Continue taking all of your other prescription medications up until the day of surgery.  ON THE DAY OF SURGERY ONLY TAKE THESE MEDICATIONS WITH SIPS OF WATER:  atorvastatin  (LIPITOR)  escitalopram  (LEXAPRO )  ezetimibe  (ZETIA )  metoprolol  succinate  pantoprazole  (PROTONIX )   No Alcohol for 24 hours before or after surgery.  No Smoking including e-cigarettes for 24 hours before surgery.  No chewable tobacco products for at least 6 hours before surgery.  No nicotine patches on the day of surgery.  Do not use any  recreational drugs for at least a week (preferably 2 weeks) before your surgery.  Please be advised that the combination of cocaine and anesthesia may have negative outcomes, up to and including death. If you test positive for cocaine, your surgery will be cancelled.  On the morning of surgery brush your teeth with toothpaste and water, you may rinse your mouth with mouthwash if you wish. Do not swallow any toothpaste or mouthwash.  Do not wear jewelry, make-up, hairpins, clips or nail polish.  For welded (permanent) jewelry: bracelets, anklets, waist bands, etc.  Please have this removed prior to surgery.  If it is not removed, there is a chance that hospital personnel will need to cut it off on the day of surgery.  Do not wear lotions, powders, or perfumes.   Do not shave body hair from the neck down 48 hours before surgery.  Contact lenses, hearing aids and dentures may not be worn into surgery.  Do not bring valuables to the hospital. [facility] is not responsible for any missing/lost belongings or valuables.   Bring your C-PAP to the hospital in case you may have to spend the night.   Notify your doctor if there is any change in your medical condition (cold, fever, infection).  Wear comfortable clothing (specific to your surgery type) to the hospital.  After surgery, you can help prevent lung complications by doing breathing exercises.  Take deep breaths and cough every 1-2 hours.   If you are being discharged the day of surgery,  you will not be allowed to drive home. You will need a responsible individual to drive you home and stay with you for 24 hours after surgery.   If you are taking public transportation, you will need to have a responsible individual with you.  Please call the Pre-admissions Testing Dept. at [phone] if you have any questions about these instructions.  Surgery Visitation Policy:  Patients having surgery or a procedure may have two visitors.   Children under the age of 21 must have an adult with them who is not the patient.   Merchandiser, retail to address health-related social needs:  [email or web address]

## 2024-07-12 NOTE — Telephone Encounter (Signed)
-----   Message from Barbara Garcia sent at 07/12/2024  5:19 PM EDT ----- Regarding: Request for pre-operative cardiac clearance Request for pre-operative cardiac clearance:  1. What type of surgery is being performed?  RECTAL EXAM UNDER ANESTHESIA; INJECTION, CHEMODENERVATION AGENT; SPHINCTEROTOMY, ANAL; BOTOX  INJECTION    2. When is this surgery scheduled?  07/14/2024  3. Type of clearance being requested (medical, pharmacy, both)? MEDICAL   4. Are there any medications that need to be held prior to surgery? NONE  5. Practice name and name of physician performing surgery?  Performing surgeon: Dr. Laneta Luna, MD Requesting clearance: Barbara Pereyra, FNP-C    6. Anesthesia type (none, local, MAC, general)? MAC  7. What is the office phone and fax number?   Phone: [phone] Fax: [phone]  ATTENTION: Unable to create telephone message as per your standard workflow. Directed by [facility] providers to send requests for cardiac clearance to this pool for appropriate distribution to provider covering pre-operative clearances.   Barbara Pereyra, MSN, APRN, FNP-C, CEN [facility]  Peri-operative Services Nurse Practitioner Phone: [phone] 07/12/24 5:19 PM

## 2024-07-12 NOTE — Telephone Encounter (Signed)
      Pre-operative Risk Assessment    Patient Name: Barbara Garcia  DOB: Jan 23, 1941 MRN: [id number]   Date of last office visit: 08/03/23 EVALENE LUNGER, MD Date of next office visit: NONE   Request for Surgical Clearance    Procedure:  RECTAL EXAM UNDER ANESTHESIA; INJECTION, CHEMODENERVATION AGENT; SPHINCTEROTOMY, ANAL; BOTOX  INJECTION  Date of Surgery:  Clearance 07/14/24                                Surgeon:  Dr. Laneta Luna, MD  Surgeon's Group or Practice Name:  [facility] Phone number:  [phone]  Fax number:  [phone]    Type of Clearance Requested:   - Medical    Type of Anesthesia:  MAC   Additional requests/questions:    Signed, Lucie DELENA Ku   07/12/2024, 5:25 PM      Elnor Dorise BRAVO, NP  P Cv Div Preop Callback Request for pre-operative cardiac clearance:   1. What type of surgery is being performed? RECTAL EXAM UNDER ANESTHESIA; INJECTION, CHEMODENERVATION AGENT; SPHINCTEROTOMY, ANAL; BOTOX  INJECTION  2. When is this surgery scheduled? 07/14/2024   3. Type of clearance being requested (medical, pharmacy, both)? MEDICAL   4. Are there any medications that need to be held prior to surgery? NONE  5. Practice name and name of physician performing surgery? Performing surgeon: Dr. Laneta Luna, MD Requesting clearance: Dorise Elnor, FNP-C     6. Anesthesia type (none, local, MAC, general)? MAC  7. What is the office phone and fax number?   Phone: [phone] Fax: [phone]  ATTENTION: Unable to create telephone message as per your standard workflow. Directed by [facility] providers to send requests for cardiac clearance to this pool for appropriate distribution to provider covering pre-operative clearances.  Dorise Elnor, MSN, APRN, FNP-C, CEN [facility] Peri-operative Services Nurse Practitioner Phone: [phone] 07/12/24 5:19 PM

## 2024-07-13 ENCOUNTER — Encounter
Admission: RE | Admit: 2024-07-13 | Discharge: 2024-07-13 | Disposition: A | Discharge status: Home / Self Care | Source: Ambulatory Visit | Attending: Surgery | Admitting: Surgery

## 2024-07-13 ENCOUNTER — Ambulatory Visit: Attending: Nurse Practitioner | Admitting: Nurse Practitioner

## 2024-07-13 ENCOUNTER — Encounter: Payer: Self-pay | Admitting: Nurse Practitioner

## 2024-07-13 ENCOUNTER — Encounter: Payer: Self-pay | Admitting: Surgery

## 2024-07-13 ENCOUNTER — Telehealth: Payer: Self-pay | Admitting: *Deleted

## 2024-07-13 DIAGNOSIS — I2510 Atherosclerotic heart disease of native coronary artery without angina pectoris: Secondary | ICD-10-CM | POA: Diagnosis not present

## 2024-07-13 DIAGNOSIS — Z01812 Encounter for preprocedural laboratory examination: Secondary | ICD-10-CM

## 2024-07-13 DIAGNOSIS — E782 Mixed hyperlipidemia: Secondary | ICD-10-CM | POA: Diagnosis not present

## 2024-07-13 DIAGNOSIS — Z01818 Encounter for other preprocedural examination: Secondary | ICD-10-CM | POA: Diagnosis not present

## 2024-07-13 DIAGNOSIS — I341 Nonrheumatic mitral (valve) prolapse: Secondary | ICD-10-CM | POA: Insufficient documentation

## 2024-07-13 DIAGNOSIS — Z01810 Encounter for preprocedural cardiovascular examination: Secondary | ICD-10-CM | POA: Diagnosis present

## 2024-07-13 LAB — CBC
HCT: 36.6 % (ref 36.0–46.0)
Hemoglobin: 12.1 g/dL (ref 12.0–15.0)
MCH: 30.4 pg (ref 26.0–34.0)
MCHC: 33.1 g/dL (ref 30.0–36.0)
MCV: 92 fL (ref 80.0–100.0)
Platelets: 180 K/uL (ref 150–400)
RBC: 3.98 MIL/uL (ref 3.87–5.11)
RDW: 12.2 % (ref 11.5–15.5)
WBC: 3.3 K/uL — ABNORMAL LOW (ref 4.0–10.5)
nRBC: 0 % (ref 0.0–0.2)

## 2024-07-13 LAB — BASIC METABOLIC PANEL WITH GFR
Anion gap: 10 (ref 5–15)
BUN: 20 mg/dL (ref 8–23)
CO2: 26 mmol/L (ref 22–32)
Calcium: 9.3 mg/dL (ref 8.9–10.3)
Chloride: 102 mmol/L (ref 98–111)
Creatinine, Ser: 0.97 mg/dL (ref 0.44–1.00)
GFR, Estimated: 58 mL/min — ABNORMAL LOW (ref >60)
Glucose, Bld: 102 mg/dL — ABNORMAL HIGH (ref 70–99)
Potassium: 4.2 mmol/L (ref 3.5–5.1)
Sodium: 138 mmol/L (ref 135–145)

## 2024-07-13 NOTE — Telephone Encounter (Signed)
 Pt called back and has been scheduled tele preop appt per Rosaline Bane, NP. Med rec and consent are done.

## 2024-07-13 NOTE — Telephone Encounter (Signed)
 Left message to call back to schedule tele preop appt. Per Rosaline Bane, NP pt will need to be added on today.

## 2024-07-13 NOTE — Telephone Encounter (Signed)
 Primary Cardiologist:Timothy Perla, MD   Preoperative team, please contact this patient and set up a phone call appointment for further preoperative risk assessment. Please obtain consent and complete medication review. Thank you for your help.   I confirm that guidance regarding antiplatelet and oral anticoagulation therapy has been completed and, if necessary, noted below (none requested).  I also confirmed the patient resides in the state of [place] . As per [facility] Medical Board telemedicine laws, the patient must reside in the state in which the provider is licensed.   Rosaline EMERSON Bane, NP-C  07/13/2024, 8:15 AM [address], [address] [place], [place] [address] Office [phone] Fax [phone]

## 2024-07-13 NOTE — Progress Notes (Signed)
 Perioperative / Anesthesia Services  Pre-Admission Testing Clinical Review / Pre-Operative Anesthesia Consult  Date: 07/13/24  PATIENT DEMOGRAPHICS: Name: Barbara Garcia DOB: [birth year] MRN:   [id number]  Note: Available PAT nursing documentation and vital signs have been reviewed. Clinical nursing staff has updated patient's PMH/PSHx, current medication list, and drug allergies/intolerances to ensure complete and comprehensive history available to assist care teams in MDM as it pertains to the aforementioned surgical procedure and anticipated anesthetic course. Extensive review of available clinical information personally performed. [place] [facility] and PSHx updated with any diagnoses/procedures that  may have been inadvertently omitted during her intake with the pre-admission testing department's nursing staff.  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 female who is submitted for pre-surgical anesthesia review and clearance prior to her undergoing the above procedure. Patient has never been a smoker in the past.  Pertinent PMH includes: CAD, diastolic dysfunction, MVP, angina, PSVT, aortic atherosclerosis, HLD, DOE, Sjogren syndrome, GERD (on daily PPI), anal fissure, anal stenosis, IDA, OA, cervical DDD, anxiety.  Patient is followed by cardiology (Gollan, MD). She was last seen in the cardiology clinic on 08/03/2023; notes reviewed. At the time of her clinic  visit, patient was just getting over multi lobar pneumonia. Patient denied any chest pain, shortness of breath, PND, orthopnea, palpitations, significant peripheral edema, weakness, fatigue, vertiginous symptoms, or presyncope/syncope. Patient with a past medical history significant for cardiovascular diagnoses. Documented physical exam was grossly benign, providing no evidence of acute exacerbation and/or decompensation of the patient's known cardiovascular conditions.  Long-term cardiac event monitor study performed on 03/31/2021 revealing a predominant underlying sinus rhythm at an average rate of 81 bpm; range 54-200 bpm.  There were 27 runs of SVT/atrial tachycardia with the fastest interval lasting 4 beats at a max rate of 200 bpm and the longest lasting 14 beats at an average rate of 121 bpm.  There was rare atrial and ventricular ectopy.  No sustained arrhythmias or prolonged pauses.  Patient triggered events not associated with significant arrhythmia.  Most recent TTE performed on 04/10/2021 revealed a normal left ventricular systolic function with a hyperdynamic LVEF of 65-70%.There were no regional wall motion abnormalities. Left ventricular diastolic Doppler parameters consistent with pseudonormalization (G2DD). GLS -15.2%. Right ventricular size and function normal with a TAPSE measuring 2.4 cm  (normal range >/= 1.6 cm).  RVSP = 34.4 mmHg.  There was mild mitral valve regurgitation.  Unable to exclude mild prolapse of the mitral valve all transvalvular gradients were noted to be normal providing no evidence of hemodynamically significant valvular stenosis. Aorta normal in size with no evidence of ectasia or aneurysmal dilatation.  Blood pressure elevated at 160/80 mmHg on currently prescribed beta-blocker (metoprolol  succinate) monotherapy. Patient is on atorvastatin  + ezetimibe  for her HLD diagnosis and ASCVD prevention.  Patient has a supply  of short acting nitrates (NTG) to use on a as needed  basis for recurrent angina/anginal equivalent symptoms; denied recent use.  Patient is not diabetic. She does not have an OSAH diagnosis.  Patient active per baseline.  Patient is able to complete all of her  ADL/IADLs without cardiovascular limitation.  Per the DASI, patient is able to achieve at least 4 METS of physical activity without experiencing any significant degree of angina/anginal equivalent symptoms. No changes were made to her medication regimen during her visit with cardiology.  Patient scheduled to follow-up with outpatient cardiology in 1 year or sooner if needed.  Barbara Garcia is scheduled for an elective EXAM UNDER ANESTHESIA, RECTUM; CHEMODENERVATION AGENT SPHINCTEROTOMY; ANAL BOTOX  INJECTION on 07/14/2024 with Dr. Laneta JULIANNA Luna, MD. Given patient's past medical history significant for cardiovascular diagnoses, presurgical cardiac clearance was sought by the PAT team.  Per cardiology, according to the Revised Cardiac Risk Index (RCRI), her Perioperative Risk of Major Cardiac Event is (%): 0.4. Her Functional Capacity in METs is: 6.61 according to the [facility] Activity Status Index (DASI). The patient is doing well from a cardiac perspective. Therefore, based on ACC/AHA guidelines, the patient would be at ACCEPTABLE risk for the planned procedure without further cardiovascular testing.   In review of her medication reconciliation, the patient is not noted to be taking any type of anticoagulation or antiplatelet therapies that would need to be held during her perioperative course.  Patient denies previous perioperative complications with anesthesia in the past. In review her EMR, it is noted that patient underwent a general anesthetic course here at [facility] in 07/2019 without documented complications.   MOST RECENT VITAL SIGNS:    07/12/2024    3:22 PM 07/10/2024    8:50 AM 08/03/2023    9:09 AM  Vitals with BMI  Height 5' 5 5' 5    Weight 135 lbs 135 lbs 3 oz   BMI 22.47 22.5   Systolic  171 160  Diastolic  79 80  Pulse  76    PROVIDERS/SPECIALISTS: NOTE: Primary physician provider listed below. Patient may have been seen by APP or partner within same practice.   PROVIDER ROLE / SPECIALTY LAST SHERLEAN Luna Laneta JULIANNA, MD General Surgery (Surgeon) 07/10/2024  Epifanio Alm SQUIBB, MD Primary Care Provider 05/31/2024  Perla Lye, MD Cardiology 08/03/2023; preop APP call 07/13/2024  Theotis Chancellor, MD Pulmonary Medicine 11/10/2023   ALLERGIES: Allergies  Allergen Reactions   Lisinopril Cough    CURRENT HOME MEDICATIONS: No current facility-administered medications for this encounter.    albuterol  (VENTOLIN  HFA) 108 (90 Base) MCG/ACT inhaler   atorvastatin  (LIPITOR) 20 MG tablet   carboxymethylcellulose (REFRESH PLUS) 0.5 % SOLN   escitalopram  (LEXAPRO ) 5 MG tablet   ezetimibe  (ZETIA ) 10 MG tablet   fexofenadine-pseudoephedrine (ALLEGRA-D) 60-120 MG 12 hr tablet   Magnesium 400 MG TABS   metoprolol  succinate (TOPROL -XL) 25 MG 24 hr tablet   Multiple Vitamin (MULTIVITAMIN WITH MINERALS) TABS tablet   nitroGLYCERIN  (NITROSTAT ) 0.4 MG SL tablet   pantoprazole  (PROTONIX ) 40 MG tablet   amoxicillin (AMOXIL) 500 MG tablet   HISTORY: Past Medical History:  Diagnosis Date   Allergy    Anal fissure    Anxiety    Aortic atherosclerosis (HCC)    Arthritis    Benign essential tremor    CAD (coronary artery disease)    Chronic dermatitis    Colon polyps    Combined form of  senile cataract    Constipation    DDD (degenerative disc disease), cervical    Diastolic dysfunction    GERD (gastroesophageal reflux disease)    Hearing loss    Hemorrhoids    History of cystocele    History of shingles    Hyperglycemia    Hyperlipidemia    Hyperopia with astigmatism and presbyopia    Hypertension    Iris nevus    Iron deficiency anemia    MVP (mitral valve prolapse)    Nonspecific chest pain    Osteopenia     Osteoporosis    Pneumonia    Posterior vitreous detachment, bilateral    PSVT (paroxysmal supraventricular tachycardia) (HCC)    Retinal drusen, bilateral    Shortness of breath on exertion    Sjogrens syndrome (HCC)    Vitamin D  deficiency    Past Surgical History:  Procedure Laterality Date   ABDOMINAL HYSTERECTOMY     BOTOX  INJECTION N/A 10/11/2018   Procedure: BOTOX  INJECTION;  Surgeon: Jordis Laneta FALCON, MD;  Location: [facility] ORS;  Service: General;  Laterality: N/A;   COLONOSCOPY W/ BIOPSIES  06/18/2005   COLONOSCOPY WITH PROPOFOL  N/A 08/03/2019   Procedure: COLONOSCOPY WITH PROPOFOL ;  Surgeon: Unk Corinn Skiff, MD;  Location: [facility] ENDOSCOPY;  Service: Gastroenterology;  Laterality: N/A;   CYST EXCISION Left    between wrist and thumb   ESOPHAGOGASTRODUODENOSCOPY (EGD) WITH PROPOFOL  N/A 08/03/2019   Procedure: ESOPHAGOGASTRODUODENOSCOPY (EGD) WITH PROPOFOL ;  Surgeon: Unk Corinn Skiff, MD;  Location: [facility] ENDOSCOPY;  Service: Gastroenterology;  Laterality: N/A;   HEMORRHOID SURGERY     INCISION AND DRAINAGE PERIRECTAL ABSCESS     ORIF FINGER / THUMB FRACTURE Right    pin removed later   TONSILLECTOMY     Family History  Problem Relation Age of Onset   Hypertension Mother    Cancer Mother    Stroke Mother    Heart disease Mother    Heart failure Mother    Heart disease Father    Hyperlipidemia Father    Hypertension Father    Heart attack Father    Arthritis Sister    Hypertension Sister    Kidney disease Sister    Stroke Sister    Heart Problems Sister    Heart disease Sister    Heart Problems Sister    Hypertension Sister    Heart Problems Brother    Heart Problems Brother    Heart Problems Brother    Heart Problems Brother    Breast cancer Neg Hx    Social History   Tobacco Use   Smoking status: Never   Smokeless tobacco: Never  Substance Use Topics   Alcohol use: No   LABS:  Hospital Outpatient Visit on 07/13/2024  Component Date Value Ref  Range Status   Sodium 07/13/2024 138  135 - 145 mmol/L Final   Potassium 07/13/2024 4.2  3.5 - 5.1 mmol/L Final   Chloride 07/13/2024 102  98 - 111 mmol/L Final   CO2 07/13/2024 26  22 - 32 mmol/L Final   Glucose, Bld 07/13/2024 102 (H)  70 - 99 mg/dL Final   Glucose reference range applies only to samples taken after fasting for at least 8 hours.   BUN 07/13/2024 20  8 - 23 mg/dL Final   Creatinine, Ser 07/13/2024 0.97  0.44 - 1.00 mg/dL Final   Calcium  07/13/2024 9.3  8.9 - 10.3 mg/dL Final   GFR, Estimated 07/13/2024 58 (L)  >60 mL/min Final  Comment: (NOTE) Calculated using the CKD-EPI Creatinine Equation (2021)    Anion gap 07/13/2024 10  5 - 15 Final   Performed at [facility], [address] [place], [place] [address]   WBC 07/13/2024 3.3 (L)  4.0 - 10.5 K/uL Final   RBC 07/13/2024 3.98  3.87 - 5.11 MIL/uL Final   Hemoglobin 07/13/2024 12.1  12.0 - 15.0 g/dL Final   HCT 91/78/7974 36.6  36.0 - 46.0 % Final   MCV 07/13/2024 92.0  80.0 - 100.0 fL Final   MCH 07/13/2024 30.4  26.0 - 34.0 pg Final   MCHC 07/13/2024 33.1  30.0 - 36.0 g/dL Final   RDW 91/78/7974 12.2  11.5 - 15.5 % Final   Platelets 07/13/2024 180  150 - 400 K/uL Final   nRBC 07/13/2024 0.0  0.0 - 0.2 % Final   Performed at [facility], [address] [place], [place] [address]    ECG: Date: 07/13/2024  Time ECG obtained: 0958 AM Rate: 68 bpm Rhythm: normal sinus Axis (leads I and aVF): normal Intervals: PR 154 ms. QRS 84 ms. QTc 412 ms. ST segment and T wave changes: No evidence of acute T wave abnormalities or significant ST segment elevation or depression.  Evidence of a possible, age undetermined, prior infarct:  No Comparison: Similar to previous tracing obtained on 08/03/2023   IMAGING / PROCEDURES: CT CHEST WO CONTRAST performed on 08/20/2023 Resolution of scattered bilateral heterogeneous airspace opacities seen on prior examination dated 07/02/2023. Residual bandlike  scarring of the right apex, medial segment right middle lobe, and of the left lung base Coronary artery disease. Aortic atherosclerosis   MR CERVICAL SPINE WO CONTRAST performed on 03/25/2023 Cervical spine degeneration especially affecting facets. Facet ankylosis has occurred on the left at C3-4. Some inflammation at the atlantodental joint without pannus or impingement. The canal and foramina are patent throughout the cervical spine.   TRANSTHORACIC ECHOCARDIOGRAM performed on 04/10/2021 Left ventricular ejection fraction, by estimation, is 65 to 70%. The left ventricle has normal function. The left ventricle has no regional  wall motion abnormalities. Left ventricular diastolic parameters are consistent with Grade II diastolic  dysfunction (pseudonormalization). The average left ventricular global longitudinal strain is -15.2 %.  Right ventricular systolic function is normal. The right ventricular size is normal. There is normal pulmonary artery systolic pressure. The estimated right ventricular systolic pressure is 34.4 mmHg.  The mitral valve is normal in structure. Unable to exclude mild prolapse, challenging images. Mild mitral valve regurgitation.   LONG TERM CARDIAC EVENT MONITOR STUDY performed on 03/31/2021 Patch Wear Time:  14 days and 0 hours (2022-04-19T10:50:30-0400 to 2022-05-03T10:50:34-0400) Patient had a min HR of 54 bpm, max HR of 200 bpm, and avg HR of 81 bpm. Predominant underlying rhythm was Sinus Rhythm.  27 Supraventricular Tachycardia/atrial tachycardia  runs occurred, the run with the fastest interval lasting 4 beats with a max rate of 200 bpm, the longest lasting 14 beats with an avg rate of 121 bpm. Isolated SVEs were rare (<1.0%), SVE Couplets were rare (<1.0%), and SVE Triplets were rare (<1.0%). Isolated VEs were rare (<1.0%, 47), VE Couplets were rare (<1.0%, 7), and VE Triplets were rare (<1.0%, 1).  Patient triggered events not associated with significant  arrhythmia  PULMONARY FUNCTION TESTING performed on 09/12/2019    IMPRESSION AND PLAN: Barbara Garcia has been referred for pre-anesthesia review and clearance prior to her undergoing the planned anesthetic and procedural courses. Available labs, pertinent testing, and imaging  results were personally reviewed by me in preparation for upcoming operative/procedural course. [facility] medical record has been updated following extensive record review and patient interview with PAT staff.   This patient has been appropriately cleared by cardiology with an overall ACCEPTABLE risk of patient experiencing significant perioperative cardiovascular complications. Based on clinical review performed today (07/13/24), barring any significant acute changes in the patient's overall condition, it is anticipated that she will be able to proceed with the planned surgical intervention. Any acute changes in clinical condition may necessitate her procedure being postponed and/or cancelled. Patient will meet with anesthesia team (MD and/or CRNA) on the day of her procedure for preoperative evaluation/assessment. Questions regarding anesthetic course will be fielded at that time.   Pre-surgical instructions were reviewed with the patient during his PAT appointment, and questions were fielded to satisfaction by PAT clinical staff. She has been instructed on which medications that she will need to hold prior to surgery, as well as the ones that have been deemed safe/appropriate to take on the day of her procedure. As part of the general education provided by PAT, patient made aware both verbally and in writing, that she would need to abstain from the use of any illegal substances during her perioperative course. She was advised that failure to follow the provided instructions could necessitate case cancellation or result in serious perioperative complications up to and including death. Patient encouraged to contact PAT  and/or her surgeon's office to discuss any questions or concerns that may arise prior to surgery; verbalized understanding.   Barbara Pereyra, MSN, APRN, FNP-C, CEN [facility]  Perioperative Services Nurse Practitioner Phone: [phone] Fax: [phone] 07/13/24 3:47 PM  NOTE: This note has been prepared using Dragon dictation software. Despite my best ability to proofread, there is always the potential that unintentional transcriptional errors may still occur from this process.

## 2024-07-13 NOTE — Telephone Encounter (Signed)
 Pt called back and has been scheduled tele preop appt per Rosaline Bane, NP. Med rec and consent are done.       Patient Consent for Virtual Visit        Delisia Mcquiston has provided verbal consent on 07/13/2024 for a virtual visit (video or telephone).   CONSENT FOR VIRTUAL VISIT FOR:  Roseline Charmaine Blush  By participating in this virtual visit I agree to the following:  I hereby voluntarily request, consent and authorize [facility] and its employed or contracted physicians, physician assistants, nurse practitioners or other licensed health care professionals (the Practitioner), to provide me with telemedicine health care services (the "Services) as deemed necessary by the treating Practitioner. I acknowledge and consent to receive the Services by the Practitioner via telemedicine. I understand that the telemedicine visit will involve communicating with the Practitioner through live audiovisual communication technology and the disclosure of certain medical information by electronic transmission. I acknowledge that I have been given the opportunity to request an in-person assessment or other available alternative prior to the telemedicine visit and am voluntarily participating in the telemedicine visit.  I understand that I have the right to withhold or withdraw my consent to the use of telemedicine in the course of my care at any time, without affecting my right to future care or treatment, and that the Practitioner or I may terminate the telemedicine visit at any time. I understand that I have the right to inspect all information obtained and/or recorded in the course of the telemedicine visit and may receive copies of available information for a reasonable fee.  I understand that some of the potential risks of receiving the Services via telemedicine include:  Delay or interruption in medical evaluation due to technological equipment failure or disruption; Information  transmitted may not be sufficient (e.g. poor resolution of images) to allow for appropriate medical decision making by the Practitioner; and/or  In rare instances, security protocols could fail, causing a breach of personal health information.  Furthermore, I acknowledge that it is my responsibility to provide information about my medical history, conditions and care that is complete and accurate to the best of my ability. I acknowledge that Practitioner's advice, recommendations, and/or decision may be based on factors not within their control, such as incomplete or inaccurate data provided by me or distortions of diagnostic images or specimens that may result from electronic transmissions. I understand that the practice of medicine is not an exact science and that Practitioner makes no warranties or guarantees regarding treatment outcomes. I acknowledge that a copy of this consent can be made available to me via my patient portal [facility] MyChart), or I can request a printed copy by calling the office of [facility].    I understand that my insurance will be billed for this visit.   I have read or had this consent read to me. I understand the contents of this consent, which adequately explains the benefits and risks of the Services being provided via telemedicine.  I have been provided ample opportunity to ask questions regarding this consent and the Services and have had my questions answered to my satisfaction. I give my informed consent for the services to be provided through the use of telemedicine in my medical care

## 2024-07-13 NOTE — Progress Notes (Signed)
 Virtual Visit via Telephone Note   Because of Barbara Garcia co-morbid illnesses, she is at least at moderate risk for complications without adequate follow up.  This format is felt to be most appropriate for this patient at this time.  Due to technical limitations with video connection (technology), today's appointment will be conducted as an audio only telehealth visit, and Barbara Garcia verbally agreed to proceed in this manner.   All issues noted in this document were discussed and addressed.  No physical exam could be performed with this format.  Evaluation Performed:  Preoperative cardiovascular risk assessment _____________   Date:  07/13/2024   Patient ID:  Barbara Garcia, DOB Sep 02, 1941, MRN [id number] Patient Location:  Home Provider location:   Office  Primary Care Provider:  Epifanio Alm SQUIBB, MD Primary Cardiologist:  Evalene Lunger, MD  Chief Complaint / Patient Profile   83 y.o. y/o female with a h/o HTN, coronary artery calcification, MVP who is pending rectal exam under anesthesia, chemodenervation agent, sphincterotomy, anal botox  injection on 8/22 with Dr. Jordis and presents today for telephonic preoperative cardiovascular risk assessment.  History of Present Illness    Barbara Garcia is a 83 y.o. female who presents via audio/video conferencing for a telehealth visit today.  Pt was last seen in cardiology clinic on 08/03/23 by Dr. Gollan.  At that time Barbara Garcia was doing well.  The patient is now pending procedure as outlined above. Since her last visit, she denies chest pain, shortness of breath, lower extremity edema, fatigue, palpitations, melena, hematuria, hemoptysis, diaphoresis, weakness, presyncope, syncope, orthopnea, and PND. She is active at home and in her yard and is able to achieve > 4 METS activity without concerning cardiac symptoms.   Past Medical History    Past Medical History:  Diagnosis Date    Allergy    Anal fissure    Anxiety    Aortic atherosclerosis (HCC)    Arthritis    Benign essential tremor    CAD (coronary artery disease)    Chronic dermatitis    Colon polyps    Combined form of senile cataract    Constipation    DDD (degenerative disc disease), cervical    Diastolic dysfunction    GERD (gastroesophageal reflux disease)    Hearing loss    Hemorrhoids    History of cystocele    History of shingles    Hyperglycemia    Hyperlipidemia    Hyperopia with astigmatism and presbyopia    Hypertension    Iris nevus    Iron deficiency anemia    MVP (mitral valve prolapse)    Nonspecific chest pain    Osteopenia    Osteoporosis    Pneumonia    Posterior vitreous detachment, bilateral    PSVT (paroxysmal supraventricular tachycardia) (HCC)    Retinal drusen, bilateral    Shortness of breath on exertion    Sjogrens syndrome (HCC)    Vitamin D  deficiency    Past Surgical History:  Procedure Laterality Date   ABDOMINAL HYSTERECTOMY     BOTOX  INJECTION N/A 10/11/2018   Procedure: BOTOX  INJECTION;  Surgeon: Jordis Laneta FALCON, MD;  Location: [facility];  Service: General;  Laterality: N/A;   COLONOSCOPY W/ BIOPSIES  06/18/2005   COLONOSCOPY WITH PROPOFOL  N/A 08/03/2019   Procedure: COLONOSCOPY WITH PROPOFOL ;  Surgeon: Unk Corinn Skiff, MD;  Location: [facility];  Service: Gastroenterology;  Laterality: N/A;   CYST EXCISION Left    between wrist and  thumb   ESOPHAGOGASTRODUODENOSCOPY (EGD) WITH PROPOFOL  N/A 08/03/2019   Procedure: ESOPHAGOGASTRODUODENOSCOPY (EGD) WITH PROPOFOL ;  Surgeon: Unk Corinn Skiff, MD;  Location: [facility];  Service: Gastroenterology;  Laterality: N/A;   HEMORRHOID SURGERY     INCISION AND DRAINAGE PERIRECTAL ABSCESS     ORIF FINGER / THUMB FRACTURE Right    pin removed later   TONSILLECTOMY      Allergies  Allergies  Allergen Reactions   Lisinopril Cough    Home Medications    Prior to Admission medications    Medication Sig Start Date End Date Taking? Authorizing Provider  albuterol  (VENTOLIN  HFA) 108 (90 Base) MCG/ACT inhaler Inhale into the lungs every 6 (six) hours as needed for wheezing or shortness of breath.    [provider]  amoxicillin (AMOXIL) 500 MG tablet Take 2,000 mg by mouth See admin instructions. Take 4 tablets (2000 mg) by mouth 1 hour prior to dental appointments. 06/20/24   [provider]  atorvastatin  (LIPITOR) 20 MG tablet TAKE 1 TABLET BY MOUTH DAILY 06/07/24   Gollan, Timothy J, MD  carboxymethylcellulose (REFRESH PLUS) 0.5 % SOLN Place 1-2 drops into both eyes 2 (two) times daily as needed (dry/irritated eyes.).    [provider]  escitalopram  (LEXAPRO ) 5 MG tablet Take 1 tablet (5 mg total) by mouth daily. 06/16/21   Webb, Padonda B, FNP  ezetimibe  (ZETIA ) 10 MG tablet Take 1 tablet (10 mg total) by mouth daily. 08/06/23 07/13/24  Gollan, Timothy J, MD  fexofenadine-pseudoephedrine (ALLEGRA-D) 60-120 MG 12 hr tablet Take 1 tablet by mouth daily as needed (allergies).    [provider]  Magnesium 400 MG TABS Take 400 mg by mouth at bedtime.    [provider]  metoprolol  succinate (TOPROL -XL) 25 MG 24 hr tablet TAKE 1 TABLET BY MOUTH ONCE  DAILY 08/25/23   Gollan, Timothy J, MD  Multiple Vitamin (MULTIVITAMIN WITH MINERALS) TABS tablet Take 1 tablet by mouth in the morning. Centrum for Women 50+    [provider]  nitroGLYCERIN  (NITROSTAT ) 0.4 MG SL tablet Place 1 tablet (0.4 mg total) under the tongue every 5 (five) minutes as needed for chest pain. 03/11/21   Walker, Caitlin S, NP  pantoprazole  (PROTONIX ) 40 MG tablet Take 40 mg by mouth daily as needed (indigestion/heartburn.).    [provider]    Physical Exam    Vital Signs:  Barbara Garcia does not have vital signs available for review today.  Given telephonic nature of communication, physical exam is limited. AAOx3. NAD. Normal affect.  Speech and  respirations are unlabored.  Accessory Clinical Findings    None  Assessment & Plan    1.  Preoperative Cardiovascular Risk Assessment: According to the Revised Cardiac Risk Index (RCRI), her Perioperative Risk of Major Cardiac Event is (%): 0.4. Her Functional Capacity in METs is: 6.61 according to the Duke Activity Status Index (DASI). The patient is doing well from a cardiac perspective. Therefore, based on ACC/AHA guidelines, the patient would be at acceptable risk for the planned procedure without further cardiovascular testing.   The patient was advised that if she develops new symptoms prior to surgery to contact our office to arrange for a follow-up visit, and she verbalized understanding.  No request to hold cardiac medications.  A copy of this note will be routed to requesting surgeon.  Time:   Today, I have spent 10 minutes with the patient with telehealth technology discussing medical history, symptoms, and management plan.  Rosaline EMERSON Bane, NP-C  07/13/2024, 3:05 PM [address], [address] [place], [place] [address] Office [phone] Fax [phone]

## 2024-07-14 ENCOUNTER — Ambulatory Visit: Payer: Self-pay | Admitting: Urgent Care

## 2024-07-14 ENCOUNTER — Other Ambulatory Visit: Payer: Self-pay

## 2024-07-14 ENCOUNTER — Encounter
Admission: RE | Disposition: A | Discharge status: Home / Self Care | Payer: Self-pay | Source: Home / Self Care | Attending: Surgery

## 2024-07-14 ENCOUNTER — Ambulatory Visit
Admission: RE | Admit: 2024-07-14 | Discharge: 2024-07-14 | Disposition: A | Discharge status: Home / Self Care | Attending: Surgery | Admitting: Surgery

## 2024-07-14 ENCOUNTER — Encounter: Payer: Self-pay | Admitting: Surgery

## 2024-07-14 DIAGNOSIS — I341 Nonrheumatic mitral (valve) prolapse: Secondary | ICD-10-CM | POA: Insufficient documentation

## 2024-07-14 DIAGNOSIS — K624 Stenosis of anus and rectum: Secondary | ICD-10-CM | POA: Insufficient documentation

## 2024-07-14 DIAGNOSIS — I4719 Other supraventricular tachycardia: Secondary | ICD-10-CM | POA: Diagnosis not present

## 2024-07-14 DIAGNOSIS — K602 Anal fissure, unspecified: Secondary | ICD-10-CM | POA: Diagnosis present

## 2024-07-14 DIAGNOSIS — K219 Gastro-esophageal reflux disease without esophagitis: Secondary | ICD-10-CM | POA: Insufficient documentation

## 2024-07-14 DIAGNOSIS — I700 Atherosclerosis of aorta: Secondary | ICD-10-CM | POA: Insufficient documentation

## 2024-07-14 DIAGNOSIS — E785 Hyperlipidemia, unspecified: Secondary | ICD-10-CM | POA: Insufficient documentation

## 2024-07-14 DIAGNOSIS — Z8249 Family history of ischemic heart disease and other diseases of the circulatory system: Secondary | ICD-10-CM | POA: Diagnosis not present

## 2024-07-14 DIAGNOSIS — F419 Anxiety disorder, unspecified: Secondary | ICD-10-CM | POA: Diagnosis not present

## 2024-07-14 DIAGNOSIS — I2510 Atherosclerotic heart disease of native coronary artery without angina pectoris: Secondary | ICD-10-CM | POA: Insufficient documentation

## 2024-07-14 DIAGNOSIS — M1990 Unspecified osteoarthritis, unspecified site: Secondary | ICD-10-CM | POA: Insufficient documentation

## 2024-07-14 DIAGNOSIS — M3500 Sicca syndrome, unspecified: Secondary | ICD-10-CM | POA: Diagnosis not present

## 2024-07-14 DIAGNOSIS — I10 Essential (primary) hypertension: Secondary | ICD-10-CM | POA: Diagnosis not present

## 2024-07-14 HISTORY — DX: Atherosclerotic heart disease of native coronary artery without angina pectoris: I25.10

## 2024-07-14 HISTORY — PX: INJECTION, CHEMODENERVATION AGENT: SHX7597

## 2024-07-14 HISTORY — DX: Other cervical disc degeneration, unspecified cervical region: M50.30

## 2024-07-14 HISTORY — DX: Chest pain, unspecified: R07.9

## 2024-07-14 HISTORY — PX: BOTOX INJECTION: SHX5754

## 2024-07-14 HISTORY — PX: SPHINCTEROTOMY: SHX5279

## 2024-07-14 HISTORY — PX: RECTAL EXAM UNDER ANESTHESIA: SHX6399

## 2024-07-14 HISTORY — DX: Supraventricular tachycardia, unspecified: I47.10

## 2024-07-14 HISTORY — DX: Other ill-defined heart diseases: I51.89

## 2024-07-14 HISTORY — DX: Atherosclerosis of aorta: I70.0

## 2024-07-14 SURGERY — EXAM UNDER ANESTHESIA, RECTUM
Anesthesia: General

## 2024-07-14 MED ORDER — CHLORHEXIDINE GLUCONATE CLOTH 2 % EX PADS
6.0000 | MEDICATED_PAD | Freq: Once | CUTANEOUS | Status: AC
  Administered 2024-07-14: 6 via TOPICAL

## 2024-07-14 MED ORDER — SODIUM CHLORIDE 0.9 % IV SOLN
2.0000 g | INTRAVENOUS | Status: AC
  Administered 2024-07-14: 2 g via INTRAVENOUS

## 2024-07-14 MED ORDER — SODIUM CHLORIDE 0.9 % IV SOLN
INTRAVENOUS | Status: AC
  Filled 2024-07-14: qty 2

## 2024-07-14 MED ORDER — ORAL CARE MOUTH RINSE: 15.0000 mL | Freq: Once | OROMUCOSAL | Status: AC

## 2024-07-14 MED ORDER — ACETAMINOPHEN 10 MG/ML IV SOLN
INTRAVENOUS | Status: DC | PRN
  Administered 2024-07-14: 1000 mg via INTRAVENOUS

## 2024-07-14 MED ORDER — SODIUM CHLORIDE (PF) 0.9 % IJ SOLN
INTRAMUSCULAR | Status: AC
  Filled 2024-07-14: qty 10

## 2024-07-14 MED ORDER — PROPOFOL 10 MG/ML IV BOLUS
INTRAVENOUS | Status: DC | PRN
Start: 2024-07-14 — End: 2024-07-14
  Administered 2024-07-14: 40 mg via INTRAVENOUS

## 2024-07-14 MED ORDER — CHLORHEXIDINE GLUCONATE 0.12 % MT SOLN
OROMUCOSAL | Status: AC
  Filled 2024-07-14: qty 15

## 2024-07-14 MED ORDER — LACTATED RINGERS IV SOLN: INTRAVENOUS | Status: DC

## 2024-07-14 MED ORDER — ONABOTULINUMTOXINA 100 UNITS IJ SOLR
50.0000 [IU] | Freq: Once | INTRAMUSCULAR | Status: DC
  Filled 2024-07-14: qty 100

## 2024-07-14 MED ORDER — PROPOFOL 500 MG/50ML IV EMUL
INTRAVENOUS | Status: DC | PRN
  Administered 2024-07-14: 145 ug/kg/min via INTRAVENOUS

## 2024-07-14 MED ORDER — CHLORHEXIDINE GLUCONATE 0.12 % MT SOLN
15.0000 mL | Freq: Once | OROMUCOSAL | Status: AC
  Administered 2024-07-14: 15 mL via OROMUCOSAL

## 2024-07-14 MED ORDER — ONABOTULINUMTOXINA 100 UNITS IJ SOLR
INTRAMUSCULAR | Status: DC | PRN
  Administered 2024-07-14: 100 [IU] via INTRAMUSCULAR

## 2024-07-14 MED ORDER — ONDANSETRON HCL 4 MG/2ML IJ SOLN
INTRAMUSCULAR | Status: DC | PRN
  Administered 2024-07-14: 4 mg via INTRAVENOUS

## 2024-07-14 SURGICAL SUPPLY — 21 items
BLADE SURG 15 STRL LF DISP TIS (BLADE) ×1 IMPLANT
BRIEF MESH DISP 2XL (UNDERPADS AND DIAPERS) ×1 IMPLANT
ELECT CAUTERY BLADE 6.4 (BLADE) ×1 IMPLANT
ELECTRODE REM PT RTRN 9FT ADLT (ELECTROSURGICAL) ×1 IMPLANT
GAUZE 4X4 16PLY ~~LOC~~+RFID DBL (SPONGE) ×1 IMPLANT
GLOVE BIO SURGEON STRL SZ7 (GLOVE) ×1 IMPLANT
GOWN STRL REUS W/ TWL LRG LVL3 (GOWN DISPOSABLE) ×2 IMPLANT
KIT TURNOVER CYSTO (KITS) ×1 IMPLANT
MANIFOLD NEPTUNE II (INSTRUMENTS) ×1 IMPLANT
NDL HYPO 22X1.5 SAFETY MO (MISCELLANEOUS) ×3 IMPLANT
NEEDLE HYPO 22X1.5 SAFETY MO (MISCELLANEOUS) ×3 IMPLANT
PACK BASIN MINOR ARMC (MISCELLANEOUS) ×1 IMPLANT
PAD OB MATERNITY 11 LF (PERSONAL CARE ITEMS) ×1 IMPLANT
PAD PREP OB/GYN DISP 24X41 (PERSONAL CARE ITEMS) ×1 IMPLANT
SOLUTION PREP PVP 2OZ (MISCELLANEOUS) ×1 IMPLANT
SPONGE T-LAP 18X18 ~~LOC~~+RFID (SPONGE) ×1 IMPLANT
SURGILUBE 2OZ TUBE FLIPTOP (MISCELLANEOUS) ×1 IMPLANT
SYR 10ML LL (SYRINGE) ×1 IMPLANT
SYR 20ML LL LF (SYRINGE) ×1 IMPLANT
TRAP FLUID SMOKE EVACUATOR (MISCELLANEOUS) ×1 IMPLANT
WATER STERILE IRR 500ML POUR (IV SOLUTION) ×1 IMPLANT

## 2024-07-14 NOTE — Op Note (Signed)
  PRE-OPERATIVE DIAGNOSIS:  Anal fissure  POST-OPERATIVE DIAGNOSIS:  Anal fissure  PROCEDURE:   1. Anorectal Exam under Anesthesia 2. Chemical Lateral internal Sphincterotomy using 100 IU Botox   SURGEON:  Surgeon(s) and Role:    * Jarious Lyon F, MD - Primary  FINDINGS: posterior midline anal fissure  EBL: minimal  ANESTHESIA: General    DICTATION:  Patient was explained about the  Procedure in detail. Risks, benefits and possible complications ( including but not limited to recurrence, transient incontinence, pain, bleeding)  and a consent was obtained. The patient taken to the operating room and placed in the lithotomy position.   Exam under anesthesia using the anal speculum revealed a posterior fissure.  There was internal hemorrhoids but they were not significant.  No other intraluminal lesions were observed. Digital palpation was performed identifying the Internal Sphincter muscle and on the lateral aspect bilaterally I injected 100 international units of Botox  total in the standard fashion.Needle and laparotomy counts were correct and there were no immediate complications  Laneta JULIANNA Luna, MD, FACS

## 2024-07-14 NOTE — Anesthesia Procedure Notes (Signed)
 Procedure Name: General with mask airway Date/Time: 07/14/2024 11:21 AM  Performed by: Ledora Duncan, CRNAPre-anesthesia Checklist: Patient identified, Emergency Drugs available, Suction available and Patient being monitored Patient Re-evaluated:Patient Re-evaluated prior to induction Oxygen Delivery Method: Simple face mask Induction Type: IV induction Placement Confirmation: positive ETCO2 and breath sounds checked- equal and bilateral Dental Injury: Teeth and Oropharynx as per pre-operative assessment

## 2024-07-14 NOTE — Discharge Instructions (Signed)
 Lateral Internal Sphincterotomy, Care After After a lateral internal sphincterotomy, it is common to have: Pain in your rectum. You may have pain when you poop. Some tenderness and swelling. A small amount of fluid or blood coming from your rectum. Follow these instructions at home: Medicines Take over-the-counter and prescription medicines only as told by your health care provider. These include medicines that help you poop (laxatives) and soften your poop (stool softeners). If you were prescribed antibiotics, take them as told by your provider. Do not stop using the antibiotic even if you start to feel better. Ask your provider if the medicine prescribed to you: Requires you to avoid driving or using machinery. Can cause constipation. You may need to take these actions to prevent or treat constipation: Drink enough fluid to keep your pee (urine) pale yellow. Take over-the-counter or prescription medicines. Eat foods that are high in fiber, such as beans, whole grains, and fresh fruits and vegetables. Limit foods that are high in fat and processed sugars, such as fried or sweet foods. Incision care  Follow instructions from your provider about how to take care of your incision. Make sure you: Take sitz baths as told by your provider. A sitz bath is a warm water bath that you take while sitting down. The water should come up to your hips and cover your butt. You may be told to take several sitz baths a day. Wear a pad in your underwear to help absorb drainage. Change this pad as needed. Check the area around your rectum every day for signs of infection. Check for: Redness. More swelling or pain. Pus. Activity If you were given a sedative during the procedure, it can affect you for several hours. Do not drive or operate machinery until your provider says that it is safe. You may have to avoid lifting. Ask your provider how much you can safely lift. Return to your normal activities as told  by your provider. Ask your provider what activities are safe for you. General instructions Do not use any products that contain nicotine or tobacco. These products include cigarettes, chewing tobacco, and vaping devices, such as e-cigarettes. If you need help quitting, ask your provider. Do not strain to poop. Do not take baths, swim, or use a hot tub until your provider approves. Ask your provider if you may take showers. You may only be allowed to take sponge baths. Your provider may give you more instructions. Make sure you know what you can and cannot do. Contact a health care provider if: You have a fever. Your pain does not get better with medicine. You get constipated. You have any signs of infection. Get help right away if: You have severe pain. You have bleeding from your rectum that does not stop. You cannot pee (urinate). This information is not intended to replace advice given to you by your health care provider. Make sure you discuss any questions you have with your health care provider. Document Revised: 11/30/2022 Document Reviewed: 10/14/2022 Elsevier Patient Education  2024 [employer].

## 2024-07-14 NOTE — Anesthesia Preprocedure Evaluation (Signed)
 Anesthesia Evaluation  Patient identified by MRN, date of birth, ID band Patient awake    Reviewed: Allergy & Precautions, H&P , NPO status , Patient's Chart, lab work & pertinent test results  Airway Mallampati: II  TM Distance: >3 FB Neck ROM: full    Dental no notable dental hx.    Pulmonary neg pulmonary ROS   Pulmonary exam normal        Cardiovascular hypertension, + CAD (Mild to moderate coronary calcification noted LAD distribution)  Normal cardiovascular exam+ Valvular Problems/Murmurs MVP   Sjogrens syndrome   5/22: 1. Left ventricular ejection fraction, by estimation, is 65 to 70%. The  left ventricle has normal function. The left ventricle has no regional  wall motion abnormalities. Left ventricular diastolic parameters are  consistent with Grade II diastolic  dysfunction (pseudonormalization). The average left ventricular global  longitudinal strain is -15.2 %.   2. Right ventricular systolic function is normal. The right ventricular  size is normal. There is normal pulmonary artery systolic pressure. The  estimated right ventricular systolic pressure is 34.4 mmHg.   3. The mitral valve is normal in structure. Unable to exclude mild  prolapse, challenging images. Mild mitral valve regurgitation.     Neuro/Psych   Anxiety     negative neurological ROS  negative psych ROS   GI/Hepatic negative GI ROS, Neg liver ROS,,,  Endo/Other  negative endocrine ROS    Renal/GU negative Renal ROS  negative genitourinary   Musculoskeletal   Abdominal   Peds  Hematology negative hematology ROS (+)   Anesthesia Other Findings Past Medical History: No date: Allergy No date: Anal fissure No date: Anxiety No date: Aortic atherosclerosis (HCC) No date: Arthritis No date: Benign essential tremor No date: CAD (coronary artery disease) No date: Chronic dermatitis No date: Colon polyps No date: Combined form of  senile cataract No date: Constipation No date: DDD (degenerative disc disease), cervical No date: Diastolic dysfunction No date: GERD (gastroesophageal reflux disease) No date: Hearing loss No date: Hemorrhoids No date: History of cystocele No date: History of shingles No date: Hyperglycemia No date: Hyperlipidemia No date: Hyperopia with astigmatism and presbyopia No date: Hypertension No date: Iris nevus No date: Iron deficiency anemia No date: MVP (mitral valve prolapse) No date: Nonspecific chest pain No date: Osteopenia No date: Osteoporosis No date: Pneumonia No date: Posterior vitreous detachment, bilateral No date: PSVT (paroxysmal supraventricular tachycardia) (HCC) 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, bilateral No date: Shortness of breath on exertion No date: Sjogrens syndrome (HCC) No date: Vitamin D  deficiency  Past Surgical History: No date: ABDOMINAL HYSTERECTOMY 10/11/2018: BOTOX  INJECTION; N/A     Comment:  Procedure: BOTOX  INJECTION;  Surgeon: Jordis Laneta FALCON,               MD;  Location: [facility];  Service: General;  Laterality:               N/A; 06/18/2005: COLONOSCOPY W/ BIOPSIES 08/03/2019: COLONOSCOPY WITH PROPOFOL ; N/A     Comment:  Procedure: COLONOSCOPY WITH PROPOFOL ;  Surgeon: Unk Corinn Skiff, MD;  Location: [facility];  Service:               Gastroenterology;  Laterality: N/A; No date: CYST EXCISION; Left     Comment:  between wrist and thumb 08/03/2019: ESOPHAGOGASTRODUODENOSCOPY (EGD) WITH PROPOFOL ; N/A     Comment:  Procedure: ESOPHAGOGASTRODUODENOSCOPY (EGD) WITH  PROPOFOL ;  Surgeon: Unk Corinn Skiff, MD;  Location:               [facility];  Service: Gastroenterology;  Laterality:               N/A; No date: HEMORRHOID SURGERY No date: INCISION AND DRAINAGE PERIRECTAL ABSCESS No date: ORIF FINGER / THUMB FRACTURE; Right     Comment:  pin removed later No date: TONSILLECTOMY  BMI    Body Mass Index:  22.47 kg/m      Reproductive/Obstetrics negative OB ROS                              Anesthesia Physical Anesthesia Plan  ASA: 3  Anesthesia Plan: General   Post-op Pain Management:    Induction:   PONV Risk Score and Plan: Propofol  infusion and TIVA  Airway Management Planned: Natural Airway  Additional Equipment:   Intra-op Plan:   Post-operative Plan:   Informed Consent:      Dental Advisory Given  Plan Discussed with: CRNA and Surgeon  Anesthesia Plan Comments:          Anesthesia Quick Evaluation

## 2024-07-14 NOTE — Transfer of Care (Signed)
 Immediate Anesthesia Transfer of Care Note  Patient: Barbara Garcia  Procedure(s) Performed: EXAM UNDER ANESTHESIA, RECTUM INJECTION, CHEMODENERVATION AGENT SPHINCTEROTOMY, ANAL BOTOX  INJECTION  Patient Location: PACU  Anesthesia Type:General  Level of Consciousness: drowsy and patient cooperative  Airway & Oxygen Therapy: Patient Spontanous Breathing and Patient connected to face mask oxygen  Post-op Assessment: Report given to RN and Post -op Vital signs reviewed and stable  Post vital signs: Reviewed and stable  Last Vitals:  Vitals Value Taken Time  BP    Temp    Pulse 61 07/14/24 11:43  Resp 17 07/14/24 11:43  SpO2 100 % 07/14/24 11:43  Vitals shown include unfiled device data.  Last Pain:  Vitals:   07/14/24 1001  TempSrc: Temporal  PainSc: 0-No pain         Complications: No notable events documented.

## 2024-07-14 NOTE — Interval H&P Note (Signed)
 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  The various methods of treatment have been discussed with the patient and family. After consideration of risks, benefits and other options for treatment, the patient has consented to  Procedure(s) with comments: EXAM UNDER ANESTHESIA, RECTUM (N/A) INJECTION, CHEMODENERVATION AGENT (N/A) - Botox  Injection SPHINCTEROTOMY, ANAL (N/A) BOTOX  INJECTION (N/A) as a surgical intervention.  The patient's history has been reviewed, patient examined, no change in status, stable for surgery.  I have reviewed the patient's chart and labs.  Questions were answered to the patient's satisfaction.     Odel Schmid F Emi Lymon

## 2024-07-14 NOTE — Interval H&P Note (Signed)
 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  The various methods of treatment have been discussed with the patient and family. After consideration of risks, benefits and other options for treatment, the patient has consented to  Procedure(s) with comments: EXAM UNDER ANESTHESIA, RECTUM (N/A) INJECTION, CHEMODENERVATION AGENT (N/A) - Botox  Injection SPHINCTEROTOMY, ANAL (N/A) BOTOX  INJECTION (N/A) as a surgical intervention.  The patient's history has been reviewed, patient examined, no change in status, stable for surgery.  I have reviewed the patient's chart and labs.  Questions were answered to the patient's satisfaction.     Barbara Garcia

## 2024-07-17 ENCOUNTER — Encounter: Payer: Self-pay | Admitting: Surgery

## 2024-07-18 NOTE — Anesthesia Postprocedure Evaluation (Signed)
 Anesthesia Post Note  Patient: Kamali Sakata  Procedure(s) Performed: EXAM UNDER ANESTHESIA, RECTUM INJECTION, CHEMODENERVATION AGENT SPHINCTEROTOMY, ANAL BOTOX  INJECTION  Patient location during evaluation: PACU Anesthesia Type: General Level of consciousness: awake and alert Pain management: pain level controlled Vital Signs Assessment: post-procedure vital signs reviewed and stable Respiratory status: spontaneous breathing, nonlabored ventilation and respiratory function stable Cardiovascular status: blood pressure returned to baseline and stable Postop Assessment: no apparent nausea or vomiting Anesthetic complications: no   No notable events documented.   Last Vitals:  Vitals:   07/14/24 1230 07/14/24 1239  BP: (!) 161/69 (!) 164/70  Pulse: (!) 55 (!) 54  Resp: 18 16  Temp: (!) 36.2 C (!) 36.2 C  SpO2: 97% 97%    Last Pain:  Vitals:   07/14/24 1239  TempSrc: Temporal  PainSc: 0-No pain                 Camellia Merilee Louder

## 2024-07-25 NOTE — Progress Notes (Signed)
 Obstetrics & Gynecology Office Visit     HPI:  Barbara Garcia is a 83 y.o. female who presents for an new patient office visit for evaluation of an abnormal vaginal discomfort. She is requesting scopolamine for motion sickness, she is leaving for a cruise later in the week, unsure if she has experienced motion sickness prior.   Symptoms have been present for months.  Vaginal symptoms: discomfort/irritation at entrance of vaginal opening Duration: intermittent Characteristics: reports of discomfort, unable to describe.  Associated signs and symptoms: denies odor, itching and discharge Prior treatments and evaluations: OTC Monistat, used old premarin cream with mild improvement  Contraception: hysterectomy Sexual history: not sexually active  Current Outpatient Medications  Medication Sig Dispense Refill  . atorvastatin  (LIPITOR) 20 MG tablet Take 20 mg by mouth once daily    . carboxymethylcellulose (REFRESH TEARS) 0.5 % ophthalmic solution Place 1-2 drops into both eyes as needed for Dry Eyes    . escitalopram  oxalate (LEXAPRO ) 5 MG tablet TAKE 1 TABLET BY MOUTH EVERY DAY 90 tablet 3  . fexofenadine-pseudoephedrine (ALLEGRA-D 12H) 60-120 mg ER tablet Take 1 tablet by mouth 2 (two) times daily    . hydrOXYzine (ATARAX) 25 MG tablet Take 0.5 tablets (12.5 mg total) by mouth at bedtime as needed for Itching 15 tablet 0  . magnesium oxide (MAG-OX) 400 mg (241.3 mg magnesium) tablet Take 400 mg by mouth once daily    . metoprolol  succinate (TOPROL -XL) 25 MG XL tablet Take 1 tablet by mouth once daily    . multivitamin tablet Take 1 tablet by mouth once daily       . nitroGLYcerin  (NITROSTAT ) 0.4 MG SL tablet Place 0.4 mg under the tongue every 5 (five) minutes as needed    . estradioL (ESTRACE) 0.01 % (0.1 mg/gram) vaginal cream Place 0.5 g vaginally twice a week 30 g 0  . ezetimibe  (ZETIA ) 10 mg tablet Take 1 tablet by mouth once daily    . fluticasone propion-salmeteroL (ADVAIR DISKUS)  250-50 mcg/dose diskus inhaler Inhale 1 Puff into the lungs every 12 (twelve) hours for 30 days 1 each 2  . scopolamine (TRANSDERM-SCOP) 1 mg over 3 days patch Place 1 patch (1 mg total) onto the skin every third day 4 patch 0   No current facility-administered medications for this visit.   Allergies  Allergen Reactions  . Lisinopril Cough   Past Medical History:  Diagnosis Date  . Allergy   . Anemia   . Arthritis   . Chronic dermatitis, unspecified   . Colon polyps   . GERD (gastroesophageal reflux disease)   . Hearing loss   . Hyperglycemia   . Hyperlipidemia   . Hypertension   . Osteoporosis   . Sjogren's syndrome (CMS/HHS-HCC)   . Vitamin D  deficiency    Past Surgical History:  Procedure Laterality Date  . HYSTERECTOMY    . TONSILLECTOMY     OB History  Gravida Para Term Preterm AB Living  4 4 4      SAB IAB Ectopic Molar Multiple Live Births           # Outcome Date GA Lbr Len/2nd Weight Sex Type Anes PTL Lv  4 Term           3 Term           2 Term           1 Term            Family History  Problem Relation  Name Age of Onset  . Coronary Artery Disease (Blocked arteries around heart) Mother    . High blood pressure (Hypertension) Mother    . High blood pressure (Hypertension) Father    . Coronary Artery Disease (Blocked arteries around heart) Father    . Coronary Artery Disease (Blocked arteries around heart) Sister    . High blood pressure (Hypertension) Sister    . Coronary Artery Disease (Blocked arteries around heart) Brother    . High blood pressure (Hypertension) Brother     Social History   Tobacco Use  . Smoking status: Never    Passive exposure: Never  . Smokeless tobacco: Never  Substance Use Topics  . Alcohol use: Never  . Drug use: Never    [place] of Systems:    ROS: see HPI for pertinent positives and negatives, otherwise a 10 system review is negative.   No SOB, no palpitations or chest pain, no new lower extremity edema, no  nausea or vomiting.   Exam:   Vitals:   07/26/24 1113  BP: (!) 164/70  Pulse: 85   Body mass index is 22.43 kg/m.  Physical Exam Constitutional:      Appearance: Normal appearance.  Genitourinary:     Left Labia: rash.         Pelvic Tanner Score: 5/5.    No vaginal discharge.     Severe vaginal atrophy present. Cardiovascular:     Rate and Rhythm: Normal rate and regular rhythm.  Neurological:     Mental Status: She is alert and oriented to person, place, and time.  Skin:    General: Skin is warm and dry.  Psychiatric:        Mood and Affect: Mood normal.        Behavior: Behavior normal.        Thought Content: Thought content normal.        Judgment: Judgment normal.  Exam conducted with a chaperone present.     Chaperone present for pelvic exam. Examination chaperoned by A. Tod, CMA.  Assessment/Plan:   Acute Vaginitis: Discussed vaginitis is a general term for disorders of the vagina including inflammation, infection or changes to the vaginal flora. Reviewed various sign and symptoms associated with vaginitis. Discussed the difference between infectious and non-infectious vaginitis and their etiologies. Discussed various treatments and recommended vulvar hygiene.    Will follow up with results of vaginal swabs. Negative wet prep.   -     Wet Prep -     estradioL (ESTRACE) 0.01 % (0.1 mg/gram) vaginal cream; Place 0.5 g vaginally twice a week  Motion sickness, initial encounter Discussed side effects of medication including anticholinergic effects, hyperthermia and drowsiness/dizziness. Due to her age, she is at higher risk for these side effects. I recommended she discontinue the hydroxyzine when using. I recommended her to monitor for motion sickness prior to using patch, knowing that it will take longer to take effect after onset of symptoms.   -     scopolamine (TRANSDERM-SCOP) 1 mg over 3 days patch; Place 1 patch (1 mg total) onto the skin every third  day   Hypertension, unspecified type We discussed her blood pressure today being elevated. She reports that she did not take her medications this morning prior to her appointment. Denies chest pain, shortness of breath or vision changes.    Orders Placed This Encounter  Procedures  . Wet Prep    Release to patient:   Immediate  . Depression Screen -(PHQ-  2/9, BDI)     Return in 4 weeks (on 08/23/2024) for Med f/u.   Attestation Statement:   I personally performed the service, non-incident to. (WP)   Comer Ellen, FNP [facility]

## 2024-07-31 ENCOUNTER — Ambulatory Visit (INDEPENDENT_AMBULATORY_CARE_PROVIDER_SITE_OTHER): Admitting: Surgery

## 2024-07-31 ENCOUNTER — Encounter: Payer: Self-pay | Admitting: Surgery

## 2024-07-31 ENCOUNTER — Telehealth: Payer: Self-pay

## 2024-07-31 VITALS — BP 134/78 | HR 80 | Ht 65.0 in | Wt 134.0 lb

## 2024-07-31 DIAGNOSIS — R1013 Epigastric pain: Secondary | ICD-10-CM | POA: Diagnosis not present

## 2024-07-31 DIAGNOSIS — R140 Abdominal distension (gaseous): Secondary | ICD-10-CM | POA: Diagnosis not present

## 2024-07-31 DIAGNOSIS — G8929 Other chronic pain: Secondary | ICD-10-CM

## 2024-07-31 DIAGNOSIS — K602 Anal fissure, unspecified: Secondary | ICD-10-CM | POA: Diagnosis not present

## 2024-07-31 NOTE — Patient Instructions (Addendum)
 We will get you scheduled for a CT scan. We will call you about this.    We will see you back next month after your cruse.

## 2024-07-31 NOTE — Telephone Encounter (Signed)
 We have scheduled you for a CT Scan of your Abdomen and Pelvis with oral and IV contrast. This has been scheduled at [facility] on [id number] . Please arrive there by 11:15 am and go in through the [facility] entrance. If you need to reschedule your Scan, you may do so by calling [phone]. Please let us  know if you reschedule your scan as we have to get authorization from your insurance for this.  Patient is aware of date, time, and instructions.

## 2024-07-31 NOTE — Progress Notes (Signed)
 Outpatient Surgical Follow Up  07/31/2024  Barbara Garcia is an 83 y.o. female.   Chief Complaint  Patient presents with   Routine Post Op    HPI: s/p chemical sphincterotomy 2 weeks ago,  She did have some incontinence last week. Last couple of days doing better. No pain and no hematochezia , significant improvement. She does endorse intermittent abd discomofrt pain, dull, with bloating  Past Medical History:  Diagnosis Date   Allergy    Anal fissure    Anxiety    Aortic atherosclerosis (HCC)    Arthritis    Benign essential tremor    CAD (coronary artery disease)    Chronic dermatitis    Colon polyps    Combined form of senile cataract    Constipation    DDD (degenerative disc disease), cervical    Diastolic dysfunction    GERD (gastroesophageal reflux disease)    Hearing loss    Hemorrhoids    History of cystocele    History of shingles    Hyperglycemia    Hyperlipidemia    Hyperopia with astigmatism and presbyopia    Hypertension    Iris nevus    Iron deficiency anemia    MVP (mitral valve prolapse)    Nonspecific chest pain    Osteopenia    Osteoporosis    Pneumonia    Posterior vitreous detachment, bilateral    PSVT (paroxysmal supraventricular tachycardia) (HCC)    Retinal drusen, bilateral    Shortness of breath on exertion    Sjogrens syndrome (HCC)    Vitamin D  deficiency     Past Surgical History:  Procedure Laterality Date   ABDOMINAL HYSTERECTOMY     BOTOX  INJECTION N/A 10/11/2018   Procedure: BOTOX  INJECTION;  Surgeon: Jordis Laneta FALCON, MD;  Location: [facility];  Service: General;  Laterality: N/A;   BOTOX  INJECTION N/A 07/14/2024   Procedure: BOTOX  INJECTION;  Surgeon: Jordis Laneta FALCON, MD;  Location: [facility];  Service: General;  Laterality: N/A;   COLONOSCOPY W/ BIOPSIES  06/18/2005   COLONOSCOPY WITH PROPOFOL  N/A 08/03/2019   Procedure: COLONOSCOPY WITH PROPOFOL ;  Surgeon: Unk Corinn Skiff, MD;  Location: [facility];  Service:  Gastroenterology;  Laterality: N/A;   CYST EXCISION Left    between wrist and thumb   ESOPHAGOGASTRODUODENOSCOPY (EGD) WITH PROPOFOL  N/A 08/03/2019   Procedure: ESOPHAGOGASTRODUODENOSCOPY (EGD) WITH PROPOFOL ;  Surgeon: Unk Corinn Skiff, MD;  Location: [facility];  Service: Gastroenterology;  Laterality: N/A;   HEMORRHOID SURGERY     INCISION AND DRAINAGE PERIRECTAL ABSCESS     INJECTION, CHEMODENERVATION AGENT N/A 07/14/2024   Procedure: INJECTION, CHEMODENERVATION AGENT;  Surgeon: Jordis Laneta FALCON, MD;  Location: [facility];  Service: General;  Laterality: N/A;  Botox  Injection   ORIF FINGER / THUMB FRACTURE Right    pin removed later   RECTAL EXAM UNDER ANESTHESIA N/A 07/14/2024   Procedure: EXAM UNDER ANESTHESIA, RECTUM;  Surgeon: Jordis Laneta FALCON, MD;  Location: [facility];  Service: General;  Laterality: N/A;   SPHINCTEROTOMY N/A 07/14/2024   Procedure: ANNETT COUP;  Surgeon: Jordis Laneta FALCON, MD;  Location: [facility];  Service: General;  Laterality: N/A;   TONSILLECTOMY      Family History  Problem Relation Age of Onset   Hypertension Mother    Cancer Mother    Stroke Mother    Heart disease Mother    Heart failure Mother    Heart disease Father    Hyperlipidemia Father    Hypertension Father  Heart attack Father    Arthritis Sister    Hypertension Sister    Kidney disease Sister    Stroke Sister    Heart Problems Sister    Heart disease Sister    Heart Problems Sister    Hypertension Sister    Heart Problems Brother    Heart Problems Brother    Heart Problems Brother    Heart Problems Brother    Breast cancer Neg Hx     Social History:  reports that she has never smoked. She has never been exposed to tobacco smoke. She has never used smokeless tobacco. She reports that she does not drink alcohol and does not use drugs.  Allergies:  Allergies  Allergen Reactions   Lisinopril Cough    Medications reviewed.    ROS Full ROS performed and is otherwise  negative other than what is stated in HPI   BP 134/78   Pulse 80   Ht 5' 5 (1.651 m)   Wt 134 lb (60.8 kg)   SpO2 97%   BMI 22.30 kg/m   Physical Exam CONSTITUTIONAL: NAD. EYES: Pupils are equal, round, Sclera are non-icteric. RESPIRATORY:   There is normal respiratory effort,  without pathologic use of accessory muscles. GI: The abdomen is  soft, nontender, and nondistended. There are no palpable masses. There is no hepatosplenomegaly. There are normal bowel sounds in all quadrants. Rectal there is improvement in sphincter tone there is no tenderness.  GU: Rectal deferred.   MUSCULOSKELETAL: Normal muscle strength and tone. No cyanosis or edema.   SKIN: Turgor is good and there are no pathologic skin lesions or ulcers. NEUROLOGIC: Motor and sensation is grossly normal. Cranial nerves are grossly intact. PSYCH:  Oriented to person, place and time. Affect is normal.   Assessment/Plan: Doing well anal fissure, continue sitz baths and nifedipine cream CT A/P for abd distension and pain RTC 3-4 weeks  I personally spent a total of 20 minutes in the care of the patient today including performing a medically appropriate exam/evaluation, counseling and educating, placing orders, referring and communicating with other health care professionals, documenting clinical information in the EHR, independently interpreting and reviewing images studies and coordinating care.   Laneta Luna, MD [facility] General Surgeon

## 2024-08-07 ENCOUNTER — Ambulatory Visit
Admission: RE | Admit: 2024-08-07 | Discharge: 2024-08-07 | Disposition: A | Discharge status: Home / Self Care | Source: Ambulatory Visit | Attending: Surgery | Admitting: Surgery

## 2024-08-07 DIAGNOSIS — G8929 Other chronic pain: Secondary | ICD-10-CM | POA: Diagnosis present

## 2024-08-07 DIAGNOSIS — R140 Abdominal distension (gaseous): Secondary | ICD-10-CM | POA: Insufficient documentation

## 2024-08-07 DIAGNOSIS — K602 Anal fissure, unspecified: Secondary | ICD-10-CM | POA: Diagnosis present

## 2024-08-07 DIAGNOSIS — R1013 Epigastric pain: Secondary | ICD-10-CM | POA: Insufficient documentation

## 2024-08-07 MED ORDER — IOHEXOL 300 MG/ML  SOLN
100.0000 mL | Freq: Once | INTRAMUSCULAR | Status: AC | PRN
Start: 2024-08-07 — End: 2024-08-07
  Administered 2024-08-07: 100 mL via INTRAVENOUS

## 2024-08-09 ENCOUNTER — Telehealth: Payer: Self-pay | Admitting: *Deleted

## 2024-08-09 ENCOUNTER — Other Ambulatory Visit: Payer: Self-pay | Admitting: *Deleted

## 2024-08-09 DIAGNOSIS — K769 Liver disease, unspecified: Secondary | ICD-10-CM

## 2024-08-09 NOTE — Telephone Encounter (Signed)
 MRI scheduled for 08/23/24 at 9am, arrival time 8:30am. NPO 4hrs Prior [facility]

## 2024-08-23 ENCOUNTER — Ambulatory Visit
Admission: RE | Admit: 2024-08-23 | Discharge: 2024-08-23 | Disposition: A | Discharge status: Home / Self Care | Source: Ambulatory Visit | Attending: Surgery | Admitting: Surgery

## 2024-08-23 DIAGNOSIS — K769 Liver disease, unspecified: Secondary | ICD-10-CM | POA: Insufficient documentation

## 2024-08-23 MED ORDER — GADOBUTROL 1 MMOL/ML IV SOLN
6.0000 mL | Freq: Once | INTRAVENOUS | Status: AC | PRN
Start: 2024-08-23 — End: 2024-08-23
  Administered 2024-08-23: 6 mL via INTRAVENOUS

## 2024-08-28 ENCOUNTER — Encounter: Payer: Self-pay | Admitting: Surgery

## 2024-08-28 ENCOUNTER — Ambulatory Visit (INDEPENDENT_AMBULATORY_CARE_PROVIDER_SITE_OTHER): Admitting: Surgery

## 2024-08-28 VITALS — BP 135/70 | HR 96 | Temp 97.8°F | Ht 65.0 in | Wt 133.0 lb

## 2024-08-28 DIAGNOSIS — Z09 Encounter for follow-up examination after completed treatment for conditions other than malignant neoplasm: Secondary | ICD-10-CM | POA: Diagnosis not present

## 2024-08-28 DIAGNOSIS — K602 Anal fissure, unspecified: Secondary | ICD-10-CM

## 2024-08-28 NOTE — Progress Notes (Signed)
 Outpatient Surgical Follow Up  08/28/2024  Barbara Garcia is an 83 y.o. female.   Chief Complaint  Patient presents with   Routine Post Op    Anal sphincterectomy    HPI:  s/p chemical sphincterotomy 6 weeks ago, incontinence have subsided. She has veing doing much better. No pain and no hematochezia , significant improvement.  She completed her CT scan showing questionable liver lesion.  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  No needs for biopsy or intervention at this time. She sees Dr. Unk from GI. Occasional abdominal discomfort.  No hematochezia.  She is very pleased    Past Medical History:  Diagnosis Date   Allergy    Anal fissure    Anxiety    Aortic atherosclerosis    Arthritis    Benign essential tremor    CAD (coronary artery disease)    Chronic dermatitis    Colon polyps    Combined form of senile cataract    Constipation    DDD (degenerative disc disease), cervical    Diastolic dysfunction    GERD (gastroesophageal reflux disease)    Hearing loss    Hemorrhoids    History of cystocele    History of shingles    Hyperglycemia    Hyperlipidemia    Hyperopia with astigmatism and presbyopia    Hypertension    Iris nevus    Iron deficiency anemia    MVP (mitral valve prolapse)    Nonspecific chest pain    Osteopenia    Osteoporosis    Pneumonia    Posterior vitreous detachment, bilateral    PSVT (paroxysmal supraventricular tachycardia)    Retinal drusen, bilateral    Shortness of breath on exertion    Sjogrens syndrome    Vitamin D  deficiency     Past Surgical History:  Procedure Laterality Date   ABDOMINAL HYSTERECTOMY     BOTOX  INJECTION N/A 10/11/2018   Procedure: BOTOX  INJECTION;  Surgeon: Jordis Laneta FALCON, MD;  Location: [facility];  Service: General;  Laterality: N/A;   BOTOX  INJECTION N/A 07/14/2024   Procedure: BOTOX  INJECTION;   Surgeon: Jordis Laneta FALCON, MD;  Location: [facility];  Service: General;  Laterality: N/A;   COLONOSCOPY W/ BIOPSIES  06/18/2005   COLONOSCOPY WITH PROPOFOL  N/A 08/03/2019   Procedure: COLONOSCOPY WITH PROPOFOL ;  Surgeon: Unk Corinn Skiff, MD;  Location: [facility];  Service: Gastroenterology;  Laterality: N/A;   CYST EXCISION Left    between wrist and thumb   ESOPHAGOGASTRODUODENOSCOPY (EGD) WITH PROPOFOL  N/A 08/03/2019   Procedure: ESOPHAGOGASTRODUODENOSCOPY (EGD) WITH PROPOFOL ;  Surgeon: Unk Corinn Skiff, MD;  Location: [facility];  Service: Gastroenterology;  Laterality: N/A;   HEMORRHOID SURGERY     INCISION AND DRAINAGE PERIRECTAL ABSCESS     INJECTION, CHEMODENERVATION AGENT N/A 07/14/2024   Procedure: INJECTION, CHEMODENERVATION AGENT;  Surgeon: Jordis Laneta FALCON, MD;  Location: [facility];  Service: General;  Laterality: N/A;  Botox  Injection   ORIF FINGER / THUMB FRACTURE Right    pin removed later   RECTAL EXAM UNDER ANESTHESIA N/A 07/14/2024   Procedure: EXAM UNDER ANESTHESIA, RECTUM;  Surgeon: Jordis Laneta FALCON, MD;  Location: [facility];  Service: General;  Laterality: N/A;   SPHINCTEROTOMY N/A 07/14/2024   Procedure: ANNETT COUP;  Surgeon: Jordis Laneta FALCON, MD;  Location: [facility];  Service: General;  Laterality: N/A;   TONSILLECTOMY      Family  History  Problem Relation Age of Onset   Hypertension Mother    Cancer Mother    Stroke Mother    Heart disease Mother    Heart failure Mother    Heart disease Father    Hyperlipidemia Father    Hypertension Father    Heart attack Father    Arthritis Sister    Hypertension Sister    Kidney disease Sister    Stroke Sister    Heart Problems Sister    Heart disease Sister    Heart Problems Sister    Hypertension Sister    Heart Problems Brother    Heart Problems Brother    Heart Problems Brother    Heart Problems Brother    Breast cancer Neg Hx     Social History:  reports that she has never smoked. She has never  been exposed to tobacco smoke. She has never used smokeless tobacco. She reports that she does not drink alcohol and does not use drugs.  Allergies:  Allergies  Allergen Reactions   Lisinopril Cough    Medications reviewed.    ROS Full ROS performed and is otherwise negative other than what is stated in HPI   BP 135/70   Pulse 96   Temp 97.8 F (36.6 C)   Ht 5' 5 (1.651 m)   Wt 133 lb (60.3 kg)   BMI 22.13 kg/m   Physical Exam Physical Exam CONSTITUTIONAL: NAD. Chaperone present. RESPIRATORY:   There is normal respiratory effort,  without pathologic use of accessory muscles. GI: The abdomen is  soft, nontender, and nondistended. There are no palpable masses. There is no hepatosplenomegaly. There are normal bowel sounds in all quadrants. Rectal there is improvement in sphincter tone there is no tenderness , no masses SKIN: Turgor is good and there are no pathologic skin lesions or ulcers. NEUROLOGIC: Motor and sensation is grossly normal. Cranial nerves are grossly intact. PSYCH:  Oriented to person, place and time. Affect is normal.    Assessment/Plan: 83 yo very pleasant female doing Very well after chemical sphincterotomy with Botox .  Patient is very Adult nurse.  Discussed with the patient about the need for nifedipine cream sitz bath's and follow-up with GI.  No need for liver biopsy or other interventions at this time. He understands that the effects of Botox  may be reversible and we may need to perform another chemical sphincterotomy.  Laneta Luna, MD [facility] General Surgeon

## 2024-08-28 NOTE — Patient Instructions (Addendum)
 Follow-up with our office as needed.  Please call and ask to speak with a nurse if you develop questions or concerns.   Lateral Internal Sphincterotomy, Care After After a lateral internal sphincterotomy, it is common to have: Pain in your rectum. You may have pain when you poop. Some tenderness and swelling. A small amount of fluid or blood coming from your rectum. Follow these instructions at home: Medicines Take over-the-counter and prescription medicines only as told by your health care provider. These include medicines that help you poop (laxatives) and soften your poop (stool softeners). If you were prescribed antibiotics, take them as told by your provider. Do not stop using the antibiotic even if you start to feel better. Ask your provider if the medicine prescribed to you: Requires you to avoid driving or using machinery. Can cause constipation. You may need to take these actions to prevent or treat constipation: Drink enough fluid to keep your pee (urine) pale yellow. Take over-the-counter or prescription medicines. Eat foods that are high in fiber, such as beans, whole grains, and fresh fruits and vegetables. Limit foods that are high in fat and processed sugars, such as fried or sweet foods. Incision care  Follow instructions from your provider about how to take care of your incision. Make sure you: Take sitz baths as told by your provider. A sitz bath is a warm water bath that you take while sitting down. The water should come up to your hips and cover your butt. You may be told to take several sitz baths a day. Wear a pad in your underwear to help absorb drainage. Change this pad as needed. Check the area around your rectum every day for signs of infection. Check for: Redness. More swelling or pain. Pus. Activity If you were given a sedative during the procedure, it can affect you for several hours. Do not drive or operate machinery until your provider says that it is  safe. You may have to avoid lifting. Ask your provider how much you can safely lift. Return to your normal activities as told by your provider. Ask your provider what activities are safe for you. General instructions Do not use any products that contain nicotine or tobacco. These products include cigarettes, chewing tobacco, and vaping devices, such as e-cigarettes. If you need help quitting, ask your provider. Do not strain to poop. Do not take baths, swim, or use a hot tub until your provider approves. Ask your provider if you may take showers. You may only be allowed to take sponge baths. Your provider may give you more instructions. Make sure you know what you can and cannot do. Contact a health care provider if: You have a fever. Your pain does not get better with medicine. You get constipated. You have any signs of infection. Get help right away if: You have severe pain. You have bleeding from your rectum that does not stop. You cannot pee (urinate). This information is not intended to replace advice given to you by your health care provider. Make sure you discuss any questions you have with your health care provider. Document Revised: 11/30/2022 Document Reviewed: 10/14/2022 Elsevier Patient Education  2024 [employer].

## 2024-09-04 ENCOUNTER — Other Ambulatory Visit: Payer: Self-pay | Admitting: Infectious Diseases

## 2024-09-04 DIAGNOSIS — Z1231 Encounter for screening mammogram for malignant neoplasm of breast: Secondary | ICD-10-CM

## 2024-09-18 ENCOUNTER — Other Ambulatory Visit: Payer: Self-pay | Admitting: Cardiovascular Disease

## 2024-09-18 NOTE — Progress Notes (Unsigned)
 Cardiology Office Note  Date:  09/19/2024   ID:  Barbara, Garcia 03-24-1941, MRN [id number]  PCP:  Epifanio Alm SQUIBB, MD   Chief Complaint  Patient presents with   12 month follow up     The patient denies chest pain. Pt states she is having SOB comes and goes with walking.    HPI:  Barbara Garcia is a 83 year old woman with past medical history of GERD anxiety Possible mitral valve prolapse, mild mitral valve regurgitation Previous chest pain Coronary calcification on CT scan low calcium  score 39  In October 17, 2014 Husband died from COVID Oct 17, 2022 Who presents for f/u of her coronary artery disease  Last seen by myself in clinic 9/24  Doing well Lives in [place] at community living On social committee, active Has recumbent bike, past several weeks has been using it Legs feel strong, balance good  Went on a cruise, got covid, felt weak at that time with shortness of breath Symptoms have resolved over the past several weeks no chest pain  Labs from 10-18-2023 Total chol 179,LDL 96, now on zetia  No recent lipid panel available  EKG personally reviewed by myself on todays visit EKG Interpretation Date/Time:  10/17/24 September 19 2024 10:30:58 EDT Ventricular Rate:  75 PR Interval:  160 QRS Duration:  70 QT Interval:  374 QTC Calculation: 417 R Axis:   29  Text Interpretation: Normal sinus rhythm Normal ECG When compared with ECG of 13-Jul-2024 09:58, No significant change was found Confirmed by Perla Lye [phone]) on 09/19/2024 10:48:55 AM    echocardiogram, 10-17-2021  1. Left ventricular ejection fraction, by estimation, is 65 to 70%. The  left ventricle has normal function. The left ventricle has no regional  wall motion abnormalities. Left ventricular diastolic parameters are  consistent with Grade II diastolic dysfunction (pseudonormalization). The average left ventricular global longitudinal strain is -15.2 %.   2. Right ventricular systolic function is  normal. The right ventricular  size is normal. There is normal pulmonary artery systolic pressure. The  estimated right ventricular systolic pressure is 34.4 mmHg.   3. The mitral valve is normal in structure. Unable to exclude mild  prolapse, challenging images. Mild mitral valve regurgitation.   Event monitor,  Patient had a min HR of 54 bpm, max HR of 200 bpm, and avg HR of 81 bpm.  Predominant underlying rhythm was Sinus Rhythm.    27 Supraventricular Tachycardia/atrial tachycardia  runs occurred, the run with the fastest interval lasting 4 beats with a max rate of 200 bpm, the longest lasting 14 beats with an avg rate of 121 bpm.   Isolated SVEs were rare (<1.0%), SVE Couplets were rare (<1.0%), and SVE Triplets were rare (<1.0%). Isolated VEs were rare (<1.0%, 47), VE Couplets were rare (<1.0%, 7), and VE Triplets were rare (<1.0%, 1).    Patient triggered events not associated with significant arrhythmia  In 07/2015 Prior stress test, carotid ultrasound, abdominal aortic ultrasound , CT coronary calcium  score No significant carotid disease as detailed below, normal stress test, aorta was normal Very low calcium  score 39  In 10-17-14 She has been maintained on low-dose Lipitor When she takes the medication on a regular basis total cholesterol usually relatively well controlled Most recently total cholesterol 160 It has been as low as 140 Baseline cholesterol without any medication is typically 230s  Testing detailed below Prior stress test September 2016 NSR at rest.   7:35 Bruce; 117% MPHR.   No exertional chest pain. Normal  BP response.   1-1.45mm upsloping ST depression laterally at peak, transient/brief; normal in early recovery.   Symptomatically negative, electrically equivocal ECG treadmill test.  Carotid ultrasound Normal July 2015  CT coronary calcium  score 39 in the LAD February 2015  ultrasound aorta normal study abdominal 1/ 2015  Stress test 2011 Carotid  ultrasound 2010, no stenosis   PMH:   has a past medical history of Allergy, Anal fissure, Anxiety, Aortic atherosclerosis, Arthritis, Benign essential tremor, CAD (coronary artery disease), Chronic dermatitis, Colon polyps, Combined form of senile cataract, Constipation, DDD (degenerative disc disease), cervical, Diastolic dysfunction, GERD (gastroesophageal reflux disease), Hearing loss, Hemorrhoids, History of cystocele, History of shingles, Hyperglycemia, Hyperlipidemia, Hyperopia with astigmatism and presbyopia, Hypertension, Iris nevus, Iron deficiency anemia, MVP (mitral valve prolapse), Nonspecific chest pain, Osteopenia, Osteoporosis, Pneumonia, Posterior vitreous detachment, bilateral, PSVT (paroxysmal supraventricular tachycardia), Retinal drusen, bilateral, Shortness of breath on exertion, Sjogrens syndrome, and Vitamin D  deficiency.  PSH:    Past Surgical History:  Procedure Laterality Date   ABDOMINAL HYSTERECTOMY     BOTOX  INJECTION N/A 10/11/2018   Procedure: BOTOX  INJECTION;  Surgeon: Jordis Laneta FALCON, MD;  Location: [facility];  Service: General;  Laterality: N/A;   BOTOX  INJECTION N/A 07/14/2024   Procedure: BOTOX  INJECTION;  Surgeon: Jordis Laneta FALCON, MD;  Location: [facility];  Service: General;  Laterality: N/A;   COLONOSCOPY W/ BIOPSIES  06/18/2005   COLONOSCOPY WITH PROPOFOL  N/A 08/03/2019   Procedure: COLONOSCOPY WITH PROPOFOL ;  Surgeon: Unk Corinn Skiff, MD;  Location: [facility];  Service: Gastroenterology;  Laterality: N/A;   CYST EXCISION Left    between wrist and thumb   ESOPHAGOGASTRODUODENOSCOPY (EGD) WITH PROPOFOL  N/A 08/03/2019   Procedure: ESOPHAGOGASTRODUODENOSCOPY (EGD) WITH PROPOFOL ;  Surgeon: Unk Corinn Skiff, MD;  Location: [facility];  Service: Gastroenterology;  Laterality: N/A;   HEMORRHOID SURGERY     INCISION AND DRAINAGE PERIRECTAL ABSCESS     INJECTION, CHEMODENERVATION AGENT N/A 07/14/2024   Procedure: INJECTION, CHEMODENERVATION AGENT;   Surgeon: Jordis Laneta FALCON, MD;  Location: [facility];  Service: General;  Laterality: N/A;  Botox  Injection   ORIF FINGER / THUMB FRACTURE Right    pin removed later   RECTAL EXAM UNDER ANESTHESIA N/A 07/14/2024   Procedure: EXAM UNDER ANESTHESIA, RECTUM;  Surgeon: Jordis Laneta FALCON, MD;  Location: [facility];  Service: General;  Laterality: N/A;   SPHINCTEROTOMY N/A 07/14/2024   Procedure: ANNETT COUP;  Surgeon: Jordis Laneta FALCON, MD;  Location: [facility];  Service: General;  Laterality: N/A;   TONSILLECTOMY      Current Outpatient Medications  Medication Sig Dispense Refill   albuterol  (VENTOLIN  HFA) 108 (90 Base) MCG/ACT inhaler Inhale into the lungs every 6 (six) hours as needed for wheezing or shortness of breath.     atorvastatin  (LIPITOR) 20 MG tablet TAKE 1 TABLET BY MOUTH DAILY 90 tablet 3   carboxymethylcellulose (REFRESH PLUS) 0.5 % SOLN Place 1-2 drops into both eyes 2 (two) times daily as needed (dry/irritated eyes.).     escitalopram  (LEXAPRO ) 5 MG tablet Take 1 tablet (5 mg total) by mouth daily. 90 tablet 0   ezetimibe  (ZETIA ) 10 MG tablet Take 1 tablet (10 mg total) by mouth daily. 90 tablet 3   fexofenadine-pseudoephedrine (ALLEGRA-D) 60-120 MG 12 hr tablet Take 1 tablet by mouth daily as needed (allergies).     Magnesium 400 MG TABS Take 400 mg by mouth at bedtime.     Multiple Vitamin (MULTIVITAMIN WITH MINERALS) TABS tablet Take 1 tablet by  mouth in the morning. Centrum for Women 50+     nitroGLYCERIN  (NITROSTAT ) 0.4 MG SL tablet Place 1 tablet (0.4 mg total) under the tongue every 5 (five) minutes as needed for chest pain. 25 tablet 2   pantoprazole  (PROTONIX ) 40 MG tablet Take 40 mg by mouth daily as needed (indigestion/heartburn.).     amoxicillin (AMOXIL) 500 MG tablet Take 2,000 mg by mouth See admin instructions. Take 4 tablets (2000 mg) by mouth 1 hour prior to dental appointments. (Patient not taking: Reported on 09/19/2024)     metoprolol  succinate (TOPROL -XL) 25 MG 24  hr tablet TAKE 1 TABLET BY MOUTH ONCE  DAILY 90 tablet 3   No current facility-administered medications for this visit.     Allergies:   Lisinopril   Social History:  The patient  reports that she has never smoked. She has never been exposed to tobacco smoke. She has never used smokeless tobacco. She reports that she does not drink alcohol and does not use drugs.   Family History:   family history includes Arthritis in her sister; Cancer in her mother; Heart Problems in her brother, brother, brother, brother, sister, and sister; Heart attack in her father; Heart disease in her father, mother, and sister; Heart failure in her mother; Hyperlipidemia in her father; Hypertension in her father, mother, sister, and sister; Kidney disease in her sister; Stroke in her mother and sister.    Review of Systems: Review of Systems  Constitutional: Negative.   HENT: Negative.    Respiratory: Negative.    Cardiovascular: Negative.   Gastrointestinal: Negative.   Musculoskeletal: Negative.   Neurological: Negative.   Psychiatric/Behavioral: Negative.    All other systems reviewed and are negative.   PHYSICAL EXAM: VS:  BP 130/72 (BP Location: Left Arm, Patient Position: Sitting, Cuff Size: Normal)   Pulse 75   Ht 5' 5 (1.651 m)   Wt 136 lb 9.6 oz (62 kg)   SpO2 98%   BMI 22.73 kg/m  , BMI Body mass index is 22.73 kg/m. Constitutional:  oriented to person, place, and time. No distress.  HENT:  Head: Normocephalic and atraumatic.  Eyes:  no discharge. No scleral icterus.  Neck: Normal range of motion. Neck supple. No JVD present.  Cardiovascular: Normal rate, regular rhythm, normal heart sounds and intact distal pulses. Exam reveals no gallop and no friction rub. No edema No murmur heard. Pulmonary/Chest: Effort normal and breath sounds normal. No stridor. No respiratory distress.  no wheezes.  no rales.  no tenderness.  Abdominal: Soft.  no distension.  no tenderness.  Musculoskeletal:  Normal range of motion.  no  tenderness or deformity.  Neurological:  normal muscle tone. Coordination normal. No atrophy Skin: Skin is warm and dry. No rash noted. not diaphoretic.  Psychiatric:  normal mood and affect. behavior is normal. Thought content normal.     Recent Labs: 07/13/2024: BUN 20; Creatinine, Ser 0.97; Hemoglobin 12.1; Platelets 180; Potassium 4.2; Sodium 138    Lipid Panel Lab Results  Component Value Date   CHOL 179 08/03/2023   HDL 66 08/03/2023   LDLCALC 96 08/03/2023   TRIG 91 08/03/2023      Wt Readings from Last 3 Encounters:  09/19/24 136 lb 9.6 oz (62 kg)  08/28/24 133 lb (60.3 kg)  07/31/24 134 lb (60.8 kg)     ASSESSMENT AND PLAN:  Coronary artery calcification Mild to moderate coronary calcification noted LAD distribution Tolerating Lipitor 20 daily with Zetia  10 daily Lipid panel  ordered today  Shortness of breath Recommended regular walking program Reports she is exercising on her recumbent bike at home without symptoms She had mild symptoms on a cruise when she had COVID but the symptoms have resolved  Mixed hyperlipidemia Continue Lipitor with Zetia  Recheck lipids today  Hypertension goal BP (blood pressure) < 130/80 -  Blood pressure is well controlled on today's visit. No changes made to the medications.  MVP (mitral valve prolapse) No significant murmur appreciated on exam Mild MR on prior echocardiogram 2022 No further workup at this time    Orders Placed This Encounter  Procedures   EKG 12-Lead     Signed, Velinda Lunger, M.D., Ph.D. 09/19/2024  [facility] [place], [place] [phone]

## 2024-09-19 ENCOUNTER — Ambulatory Visit: Attending: Cardiovascular Disease | Admitting: Cardiovascular Disease

## 2024-09-19 ENCOUNTER — Encounter: Payer: Self-pay | Admitting: Cardiovascular Disease

## 2024-09-19 VITALS — BP 130/72 | HR 75 | Ht 65.0 in | Wt 136.6 lb

## 2024-09-19 DIAGNOSIS — R079 Chest pain, unspecified: Secondary | ICD-10-CM | POA: Insufficient documentation

## 2024-09-19 DIAGNOSIS — I2510 Atherosclerotic heart disease of native coronary artery without angina pectoris: Secondary | ICD-10-CM | POA: Diagnosis not present

## 2024-09-19 DIAGNOSIS — I341 Nonrheumatic mitral (valve) prolapse: Secondary | ICD-10-CM | POA: Insufficient documentation

## 2024-09-19 DIAGNOSIS — E785 Hyperlipidemia, unspecified: Secondary | ICD-10-CM | POA: Insufficient documentation

## 2024-09-19 DIAGNOSIS — I10 Essential (primary) hypertension: Secondary | ICD-10-CM | POA: Diagnosis present

## 2024-09-19 DIAGNOSIS — R0609 Other forms of dyspnea: Secondary | ICD-10-CM | POA: Diagnosis present

## 2024-09-19 MED ORDER — ATORVASTATIN CALCIUM 20 MG PO TABS: 20.0000 mg | ORAL_TABLET | Freq: Every day | ORAL | 2 refills | Status: AC

## 2024-09-19 MED ORDER — METOPROLOL SUCCINATE ER 25 MG PO TB24: 25.0000 mg | ORAL_TABLET | Freq: Every day | ORAL | 2 refills | Status: AC

## 2024-09-19 MED ORDER — NITROGLYCERIN 0.4 MG SL SUBL: 0.4000 mg | SUBLINGUAL_TABLET | SUBLINGUAL | 0 refills | Status: AC | PRN

## 2024-09-19 MED ORDER — EZETIMIBE 10 MG PO TABS: 10.0000 mg | ORAL_TABLET | Freq: Every day | ORAL | 2 refills | Status: AC

## 2024-09-19 NOTE — Patient Instructions (Addendum)
 Medication Instructions:  No changes  If you need a refill on your cardiac medications before your next appointment, please call your pharmacy.   Lab work: Lipids today  Testing/Procedures: No new testing needed  Follow-Up: At [facility], you and your health needs are our priority.  As part of our continuing mission to provide you with exceptional heart care, we have created designated Provider Care Teams.  These Care Teams include your primary Cardiologist (physician) and Advanced Practice Providers (APPs -  Physician Assistants and Nurse Practitioners) who all work together to provide you with the care you need, when you need it.  You will need a follow up appointment in 12 months  Providers on your designated Care Team:   Nicolasa Ducking, NP Eula Listen, PA-C Cadence Fransico Michael, [place]  COVID-19 Vaccine Information can be found at: [email or web address] For questions related to vaccine distribution or appointments, please email [email or web address] or call [phone].

## 2024-09-20 LAB — LIPID PANEL
Chol/HDL Ratio: 2.8 ratio (ref 0.0–4.4)
Cholesterol, Total: 183 mg/dL (ref 100–199)
HDL: 65 mg/dL (ref >39)
LDL Chol Calc (NIH): 101 mg/dL — ABNORMAL HIGH (ref 0–99)
Triglycerides: 95 mg/dL (ref 0–149)
VLDL Cholesterol Cal: 17 mg/dL (ref 5–40)

## 2024-09-23 ENCOUNTER — Ambulatory Visit: Payer: Self-pay | Admitting: Cardiovascular Disease

## 2024-09-23 DIAGNOSIS — Z79899 Other long term (current) drug therapy: Secondary | ICD-10-CM

## 2025-01-01 ENCOUNTER — Encounter
